# Patient Record
Sex: Female | Born: 1975 | Race: Black or African American | Hispanic: No | Marital: Single | Smoking: Former smoker
Health system: Southern US, Community
[De-identification: ages and names within clinical notes are randomized; demographics above are authoritative.]

## PROBLEM LIST (undated history)

## (undated) DIAGNOSIS — R7303 Prediabetes: Secondary | ICD-10-CM

## (undated) DIAGNOSIS — M1990 Unspecified osteoarthritis, unspecified site: Secondary | ICD-10-CM

## (undated) DIAGNOSIS — K219 Gastro-esophageal reflux disease without esophagitis: Secondary | ICD-10-CM

## (undated) DIAGNOSIS — F32A Depression, unspecified: Secondary | ICD-10-CM

## (undated) DIAGNOSIS — I10 Essential (primary) hypertension: Secondary | ICD-10-CM

## (undated) DIAGNOSIS — D649 Anemia, unspecified: Secondary | ICD-10-CM

## (undated) DIAGNOSIS — F419 Anxiety disorder, unspecified: Secondary | ICD-10-CM

## (undated) DIAGNOSIS — L309 Dermatitis, unspecified: Secondary | ICD-10-CM

## (undated) DIAGNOSIS — R928 Other abnormal and inconclusive findings on diagnostic imaging of breast: Secondary | ICD-10-CM

## (undated) DIAGNOSIS — G44209 Tension-type headache, unspecified, not intractable: Secondary | ICD-10-CM

## (undated) DIAGNOSIS — Z923 Personal history of irradiation: Secondary | ICD-10-CM

## (undated) DIAGNOSIS — R0981 Nasal congestion: Secondary | ICD-10-CM

## (undated) DIAGNOSIS — C50319 Malignant neoplasm of lower-inner quadrant of unspecified female breast: Secondary | ICD-10-CM

## (undated) DIAGNOSIS — Z87828 Personal history of other (healed) physical injury and trauma: Secondary | ICD-10-CM

## (undated) HISTORY — PX: TUBAL LIGATION: SHX77

## (undated) HISTORY — DX: Malignant neoplasm of lower-inner quadrant of unspecified female breast: C50.319

---

## 1998-02-24 DIAGNOSIS — Z87828 Personal history of other (healed) physical injury and trauma: Secondary | ICD-10-CM

## 1998-02-24 HISTORY — DX: Personal history of other (healed) physical injury and trauma: Z87.828

## 2005-03-31 ENCOUNTER — Emergency Department (HOSPITAL_COMMUNITY): Admission: EM | Admit: 2005-03-31 | Discharge: 2005-03-31 | Payer: Self-pay | Admitting: Emergency Medicine

## 2007-03-28 ENCOUNTER — Emergency Department (HOSPITAL_COMMUNITY): Admission: EM | Admit: 2007-03-28 | Discharge: 2007-03-28 | Payer: Self-pay | Admitting: Emergency Medicine

## 2007-07-30 ENCOUNTER — Emergency Department (HOSPITAL_COMMUNITY): Admission: EM | Admit: 2007-07-30 | Discharge: 2007-07-30 | Payer: Self-pay | Admitting: Emergency Medicine

## 2007-07-31 ENCOUNTER — Emergency Department (HOSPITAL_COMMUNITY): Admission: EM | Admit: 2007-07-31 | Discharge: 2007-07-31 | Payer: Self-pay | Admitting: Emergency Medicine

## 2007-12-08 ENCOUNTER — Encounter: Admission: RE | Admit: 2007-12-08 | Discharge: 2008-01-19 | Payer: Self-pay | Admitting: Specialist

## 2007-12-26 ENCOUNTER — Emergency Department (HOSPITAL_COMMUNITY): Admission: EM | Admit: 2007-12-26 | Discharge: 2007-12-26 | Payer: Self-pay | Admitting: Emergency Medicine

## 2009-02-28 ENCOUNTER — Emergency Department (HOSPITAL_COMMUNITY): Admission: EM | Admit: 2009-02-28 | Discharge: 2009-02-28 | Payer: Self-pay | Admitting: Family Medicine

## 2009-11-01 ENCOUNTER — Emergency Department (HOSPITAL_COMMUNITY): Admission: EM | Admit: 2009-11-01 | Discharge: 2009-11-01 | Payer: Self-pay | Admitting: Emergency Medicine

## 2010-11-21 LAB — URINALYSIS, ROUTINE W REFLEX MICROSCOPIC
Bilirubin Urine: NEGATIVE
Hgb urine dipstick: NEGATIVE
Ketones, ur: NEGATIVE
Specific Gravity, Urine: 1.037 — ABNORMAL HIGH

## 2010-11-26 LAB — POCT PREGNANCY, URINE: Preg Test, Ur: NEGATIVE

## 2011-01-08 ENCOUNTER — Encounter: Payer: Self-pay | Admitting: Emergency Medicine

## 2011-01-08 ENCOUNTER — Emergency Department (HOSPITAL_COMMUNITY)
Admission: EM | Admit: 2011-01-08 | Discharge: 2011-01-09 | Disposition: A | Discharge status: Home / Self Care | Payer: Medicaid Other | Attending: Emergency Medicine | Admitting: Emergency Medicine

## 2011-01-08 ENCOUNTER — Emergency Department (HOSPITAL_COMMUNITY): Payer: Medicaid Other

## 2011-01-08 DIAGNOSIS — Y9239 Other specified sports and athletic area as the place of occurrence of the external cause: Secondary | ICD-10-CM | POA: Insufficient documentation

## 2011-01-08 DIAGNOSIS — M25469 Effusion, unspecified knee: Secondary | ICD-10-CM | POA: Insufficient documentation

## 2011-01-08 DIAGNOSIS — I10 Essential (primary) hypertension: Secondary | ICD-10-CM | POA: Insufficient documentation

## 2011-01-08 DIAGNOSIS — W010XXA Fall on same level from slipping, tripping and stumbling without subsequent striking against object, initial encounter: Secondary | ICD-10-CM | POA: Insufficient documentation

## 2011-01-08 DIAGNOSIS — IMO0002 Reserved for concepts with insufficient information to code with codable children: Secondary | ICD-10-CM | POA: Insufficient documentation

## 2011-01-08 DIAGNOSIS — M25569 Pain in unspecified knee: Secondary | ICD-10-CM | POA: Insufficient documentation

## 2011-01-08 DIAGNOSIS — S8390XA Sprain of unspecified site of unspecified knee, initial encounter: Secondary | ICD-10-CM

## 2011-01-08 HISTORY — DX: Essential (primary) hypertension: I10

## 2011-01-08 NOTE — ED Notes (Signed)
PT. REPORTS FELL YESTERDAY ( TRIPPED) INJURED LEFT KNEE WITH PAIN AND SLIGHT SWELLING , PAIN WORSE WITH WEIGHT BEARING .

## 2011-01-09 MED ORDER — HYDROCODONE-ACETAMINOPHEN 5-325 MG PO TABS: 2.0000 | ORAL_TABLET | ORAL | Status: AC | PRN

## 2011-01-09 NOTE — ED Provider Notes (Signed)
Medical screening examination/treatment/procedure(s) were performed by non-physician practitioner and as supervising physician I was immediately available for consultation/collaboration.   Hanley Seamen, MD 01/09/11 [phone]

## 2011-01-09 NOTE — ED Provider Notes (Signed)
History     CSN: [id number] Arrival date & time: 01/08/2011  9:48 PM   First MD Initiated Contact with Patient 01/08/11 2356      Chief Complaint  Patient presents with  . Fall    (Consider location/radiation/quality/duration/timing/severity/associated sxs/prior treatment) HPI Comments: Patient states she was lifting weights at the gym when she twisted her left knee, falling onto it - since then reports left medial knee pain and the sensation that the knee will give out on her.  Patient is a 35 y.o. female presenting with fall. The history is provided by the patient. No language interpreter was used.  Fall The accident occurred yesterday. The fall occurred while recreating/playing. She landed on concrete. There was no blood loss. The point of impact was the left knee. The pain is present in the left knee. The pain is at a severity of 5/10. The pain is moderate. She was ambulatory at the scene. There was no entrapment after the fall. There was no drug use involved in the accident. There was no alcohol use involved in the accident. Pertinent negatives include no fever, no numbness, no bowel incontinence, no nausea, no vomiting, no headaches, no loss of consciousness and no tingling. The symptoms are aggravated by activity and flexion. She has tried nothing for the symptoms.    Past Medical History  Diagnosis Date  . Hypertension     Past Surgical History  Procedure Date  . Caesarean     No family history on file.  History  Substance Use Topics  . Smoking status: Never Smoker   . Smokeless tobacco: Not on file  . Alcohol Use: No    OB History    Grav Para Term Preterm Abortions TAB SAB Ect Mult Living                  Review of Systems  Constitutional: Negative for fever.  Gastrointestinal: Negative for nausea, vomiting and bowel incontinence.  Neurological: Negative for tingling, loss of consciousness, numbness and headaches.  All other systems reviewed and are  negative.    Allergies  Review of patient's allergies indicates no known allergies.  Home Medications  No current outpatient prescriptions on file.  BP 116/93  Pulse 72  Temp(Src) 98.8 F (37.1 C) (Oral)  SpO2 98%  Physical Exam  Constitutional: She is oriented to person, place, and time. She appears well-developed and well-nourished.  HENT:  Head: Normocephalic and atraumatic.  Right Ear: External ear normal.  Mouth/Throat: Oropharynx is clear and moist.  Eyes: Conjunctivae are normal. Pupils are equal, round, and reactive to light.  Neck: Normal range of motion. Neck supple.  Cardiovascular: Normal rate and regular rhythm.   Pulmonary/Chest: Effort normal and breath sounds normal.  Abdominal: Soft. There is no tenderness.  Musculoskeletal:       Left knee: She exhibits decreased range of motion and swelling. She exhibits no effusion, normal alignment, no LCL laxity, normal patellar mobility and normal meniscus. tenderness found. Medial joint line tenderness noted. No LCL and no patellar tendon tenderness noted.  Neurological: She is alert and oriented to person, place, and time. She has normal reflexes.  Skin: Skin is warm and dry.  Psychiatric: She has a normal mood and affect. Her behavior is normal. Judgment and thought content normal.    ED Course  Procedures (including critical care time)  Labs Reviewed - No data to display Dg Knee Complete 4 Views Left  01/08/2011  *RADIOLOGY REPORT*  Clinical Data: 35 year old female  status post twisting injury with pain.  LEFT KNEE - COMPLETE 4+ VIEW  Comparison: 12/26/2007.  Findings: No joint effusion is evident. Bone mineralization is within normal limits.  Patella intact.  Joint spaces preserved.  No acute fracture.  IMPRESSION: No acute osseous abnormality identified about the left knee.  Original Report Authenticated By: Harley Hallmark, M.D.     Left knee strain   MDM  Patient with likely left knee strain.  I doubt  either MCL or meniscal tear, though these could be possible, there is no effusion in the joint.  Will place the patient in knee immobilizer and crutches for 1 week, if she is still having pain and difficulty with walking at that point, I will have her follow up with Dr. August Saucer with orthopedics.        Izola Price [place], [place] 01/09/11 0028

## 2011-01-14 ENCOUNTER — Emergency Department (HOSPITAL_COMMUNITY)
Admission: EM | Admit: 2011-01-14 | Discharge: 2011-01-14 | Disposition: A | Discharge status: Home / Self Care | Payer: Medicaid Other | Attending: Emergency Medicine | Admitting: Emergency Medicine

## 2011-01-14 ENCOUNTER — Encounter (HOSPITAL_COMMUNITY): Payer: Self-pay

## 2011-01-14 DIAGNOSIS — I10 Essential (primary) hypertension: Secondary | ICD-10-CM | POA: Insufficient documentation

## 2011-01-14 DIAGNOSIS — T6391XA Toxic effect of contact with unspecified venomous animal, accidental (unintentional), initial encounter: Secondary | ICD-10-CM | POA: Insufficient documentation

## 2011-01-14 DIAGNOSIS — M79609 Pain in unspecified limb: Secondary | ICD-10-CM | POA: Insufficient documentation

## 2011-01-14 DIAGNOSIS — R229 Localized swelling, mass and lump, unspecified: Secondary | ICD-10-CM | POA: Insufficient documentation

## 2011-01-14 DIAGNOSIS — T63391A Toxic effect of venom of other spider, accidental (unintentional), initial encounter: Secondary | ICD-10-CM | POA: Insufficient documentation

## 2011-01-14 DIAGNOSIS — W57XXXA Bitten or stung by nonvenomous insect and other nonvenomous arthropods, initial encounter: Secondary | ICD-10-CM

## 2011-01-14 MED ORDER — CEPHALEXIN 500 MG PO CAPS: 500.0000 mg | ORAL_CAPSULE | Freq: Three times a day (TID) | ORAL | Status: AC

## 2011-01-14 NOTE — ED Notes (Signed)
Patient states she was bitten by a spider at home. Noted the spider was brown. Unknown size. States she had a bubble which she burst x 2. Noted scant yellow drainage.

## 2011-01-14 NOTE — ED Notes (Signed)
Pt presents with spider bite to R heel.  Pt reports she killed spider's body was approx 1 inch long and brown.  Pt reports noting a blister to area, she popped it with yellow drainage noted.  Ankle is warm to touch, swollen and painful.

## 2011-01-14 NOTE — ED Provider Notes (Signed)
Medical screening examination/treatment/procedure(s) were performed by non-physician practitioner and as supervising physician I was immediately available for consultation/collaboration.   Lilas Diefendorf, MD 01/14/11 2304 

## 2011-01-14 NOTE — ED Provider Notes (Signed)
History     CSN: [id number] Arrival date & time: 01/14/2011  6:16 PM   First MD Initiated Contact with Patient 01/14/11 1923      Chief Complaint  Patient presents with  . Insect Bite    (Consider location/radiation/quality/duration/timing/severity/associated sxs/prior treatment) HPI Comments: Patient with witnessed spider bite to right heel 2 days ago. Patient states yesterday she noted slight swelling in the area of the of the bite along with itching. She denies having any significant pain in the area. Yesterday she noted a blister forming which has since drained a mild amount of yellow fluid. She denies fever, nausea, vomiting. She denies any lymphadenopathy of her groin. She denies any redness streaking up her leg. She is able to walk on her foot without any problems.  Patient is a 35 y.o. female presenting with lower extremity pain. The history is provided by the patient.  Foot Pain This is a new problem. The current episode started in the past 7 days. The problem occurs constantly. The problem has been unchanged. Pertinent negatives include no abdominal pain, chills, fever, joint swelling, myalgias, nausea, rash or vomiting. She has tried nothing for the symptoms.    Past Medical History  Diagnosis Date  . Hypertension     Past Surgical History  Procedure Date  . Caesarean     History reviewed. No pertinent family history.  History  Substance Use Topics  . Smoking status: Never Smoker   . Smokeless tobacco: Not on file  . Alcohol Use: No    OB History    Grav Para Term Preterm Abortions TAB SAB Ect Mult Living                  Review of Systems  Constitutional: Negative for fever and chills.  Gastrointestinal: Negative for nausea, vomiting and abdominal pain.  Musculoskeletal: Negative for myalgias and joint swelling.  Skin: Positive for wound. Negative for rash.  Hematological: Negative for adenopathy.    Allergies  Review of patient's allergies  indicates no known allergies.  Home Medications   Current Outpatient Rx  Name Route Sig Dispense Refill  . HYDROCODONE-ACETAMINOPHEN 5-325 MG PO TABS Oral Take 2 tablets by mouth every 4 (four) hours as needed for pain. 20 tablet 0    BP 142/82  Pulse 90  Temp(Src) 98.8 F (37.1 C) (Oral)  Resp 18  SpO2 98%  LMP 12/17/2010  Physical Exam  Nursing note and vitals reviewed. Constitutional: She is oriented to person, place, and time. She appears well-developed and well-nourished.  HENT:  Head: Normocephalic and atraumatic.  Eyes: Right eye exhibits no discharge. Left eye exhibits no discharge.  Neck: Normal range of motion. Neck supple.  Musculoskeletal: Normal range of motion. She exhibits no edema and no tenderness.       Patient with small blister noted to the right heel without any significant swelling or surrounding erythema. Area is not significantly tender to palpation. There is a small amount of yellow fluid draining from the wound. There is no apparent lymphangitis.  Neurological: She is alert and oriented to person, place, and time.  Skin: Skin is warm and dry. No rash noted. There is erythema.  Psychiatric: She has a normal mood and affect.    ED Course  Procedures (including critical care time)  Labs Reviewed - No data to display No results found.   1. Insect bite    7:36 PM Patient seen and examined.  Pt urged to return with worsening pain, worsening  swelling, expanding area of redness or streaking up extremity, fever, or any other concerns. Urged to take complete course of antibiotics as prescribed. Pt verbalizes understanding and agrees with plan.   MDM  Area of drainage c/w localized area of cellulitis or allergic/inflammatory reaction. Will treat with abx given ? purulent drainage. Area is unimpressive. Return is only indicated if worsening.       Eustace Moore [place], [place] 01/14/11 2032

## 2011-01-14 NOTE — ED Notes (Signed)
Midlevel at bedside updating patient on plan

## 2011-03-08 ENCOUNTER — Emergency Department (INDEPENDENT_AMBULATORY_CARE_PROVIDER_SITE_OTHER)
Admission: EM | Admit: 2011-03-08 | Discharge: 2011-03-08 | Disposition: A | Discharge status: Home / Self Care | Payer: Medicaid Other | Source: Home / Self Care | Attending: Family Medicine | Admitting: Family Medicine

## 2011-03-08 ENCOUNTER — Encounter (HOSPITAL_COMMUNITY): Payer: Self-pay | Admitting: Emergency Medicine

## 2011-03-08 DIAGNOSIS — T148 Other injury of unspecified body region: Secondary | ICD-10-CM

## 2011-03-08 DIAGNOSIS — W57XXXA Bitten or stung by nonvenomous insect and other nonvenomous arthropods, initial encounter: Secondary | ICD-10-CM

## 2011-03-08 MED ORDER — METHYLPREDNISOLONE SODIUM SUCC 125 MG IJ SOLR
125.0000 mg | Freq: Once | INTRAMUSCULAR | Status: AC
  Administered 2011-03-08: 125 mg via INTRAMUSCULAR

## 2011-03-08 MED ORDER — METHYLPREDNISOLONE SODIUM SUCC 125 MG IJ SOLR
INTRAMUSCULAR | Status: AC
  Filled 2011-03-08: qty 2

## 2011-03-08 MED ORDER — MUPIROCIN 2 % EX OINT: TOPICAL_OINTMENT | Freq: Three times a day (TID) | CUTANEOUS | Status: AC

## 2011-03-08 MED ORDER — LORATADINE 10 MG PO TABS: 10.0000 mg | ORAL_TABLET | Freq: Every day | ORAL | Status: DC

## 2011-03-08 NOTE — ED Provider Notes (Signed)
History     CSN: [id number]  Arrival date & time 03/08/11  1040   First MD Initiated Contact with Patient 03/08/11 1054      Chief Complaint  Patient presents with  . Rash    (Consider location/radiation/quality/duration/timing/severity/associated sxs/prior treatment) Patient is a 36 y.o. female presenting with rash. The history is provided by the patient.  Rash  This is a new problem. The current episode started 12 to 24 hours ago. The problem has not changed since onset.The problem is associated with an insect bite/sting (states stayed at a hotel and now have bites). There has been no fever. The rash is present on the left arm, right arm, left upper leg and abdomen. The patient is experiencing no pain. Associated symptoms include itching. She has tried nothing for the symptoms.    Past Medical History  Diagnosis Date  . Hypertension     Past Surgical History  Procedure Date  . Caesarean   . Tubal ligation     No family history on file.  History  Substance Use Topics  . Smoking status: Never Smoker   . Smokeless tobacco: Not on file  . Alcohol Use: No    OB History    Grav Para Term Preterm Abortions TAB SAB Ect Mult Living                  Review of Systems  Skin: Positive for itching and rash.  All other systems reviewed and are negative.    Allergies  Review of patient's allergies indicates no known allergies.  Home Medications  No current outpatient prescriptions on file.  BP 119/88  Pulse 85  Temp(Src) 99.1 F (37.3 C) (Oral)  Resp 14  SpO2 100%  LMP 02/18/2011  Physical Exam  Constitutional: She appears well-developed.  HENT:  Head: Normocephalic.  Skin: Skin is warm and intact. Rash noted. Rash is urticarial.       Multiple bites all over her arms and legs there is swelling around the sites of the bites. She is rubbing her scalp but no lesions noted.  Psychiatric: Her mood appears anxious.    ED Course  Procedures (including  critical care time)  Labs Reviewed - No data to display No results found.   No diagnosis found.    MDM          Hassan Rowan, MD 03/09/11 [phone]

## 2011-03-08 NOTE — ED Notes (Signed)
Reports red bumps all over body.  Stayed in a motel last night and woke up with diverse pattern of bites

## 2011-07-06 ENCOUNTER — Emergency Department (HOSPITAL_COMMUNITY)
Admission: EM | Admit: 2011-07-06 | Discharge: 2011-07-06 | Disposition: A | Discharge status: Home / Self Care | Payer: Medicaid Other | Attending: Emergency Medicine | Admitting: Emergency Medicine

## 2011-07-06 ENCOUNTER — Encounter (HOSPITAL_COMMUNITY): Payer: Self-pay | Admitting: Emergency Medicine

## 2011-07-06 DIAGNOSIS — I10 Essential (primary) hypertension: Secondary | ICD-10-CM | POA: Insufficient documentation

## 2011-07-06 DIAGNOSIS — F410 Panic disorder [episodic paroxysmal anxiety] without agoraphobia: Secondary | ICD-10-CM | POA: Insufficient documentation

## 2011-07-06 DIAGNOSIS — F4329 Adjustment disorder with other symptoms: Secondary | ICD-10-CM | POA: Insufficient documentation

## 2011-07-06 DIAGNOSIS — F4320 Adjustment disorder, unspecified: Secondary | ICD-10-CM

## 2011-07-06 LAB — POCT I-STAT, CHEM 8
BUN: 10 mg/dL (ref 6–23)
Calcium, Ion: 1.2 mmol/L (ref 1.12–1.32)
Chloride: 103 mEq/L (ref 96–112)
Creatinine, Ser: 0.9 mg/dL (ref 0.50–1.10)
Glucose, Bld: 91 mg/dL (ref 70–99)
HCT: 34 % — ABNORMAL LOW (ref 36.0–46.0)
Hemoglobin: 11.6 g/dL — ABNORMAL LOW (ref 12.0–15.0)
Potassium: 3.7 mEq/L (ref 3.5–5.1)
Sodium: 140 mEq/L (ref 135–145)
TCO2: 25 mmol/L (ref 0–100)

## 2011-07-06 LAB — GLUCOSE, CAPILLARY: Glucose-Capillary: 97 mg/dL (ref 70–99)

## 2011-07-06 NOTE — ED Provider Notes (Signed)
History    36yF with near syncope. Mother currently very sick. Has not bee eating or sleeping well recently. Was visiting her today when became uspet and began to hyperventilate and was assissted to ground by family. Pt c/o feeling tired, otherwise no acute complaints. No CP or SOB. NO dizziness or lightheadedness currently. No palpitations.  CSN: [id number]  Arrival date & time 07/06/11  1356   First MD Initiated Contact with Patient 07/06/11 1401      Chief Complaint  Patient presents with  . Panic Attack    (Consider location/radiation/quality/duration/timing/severity/associated sxs/prior treatment) HPI  Past Medical History  Diagnosis Date  . Hypertension     Past Surgical History  Procedure Date  . Caesarean   . Tubal ligation     History reviewed. No pertinent family history.  History  Substance Use Topics  . Smoking status: Never Smoker   . Smokeless tobacco: Not on file  . Alcohol Use: No    OB History    Grav Para Term Preterm Abortions TAB SAB Ect Mult Living                  Review of Systems   Review of symptoms negative unless otherwise noted in HPI.   Allergies  Review of patient's allergies indicates no known allergies.  Home Medications   Current Outpatient Rx  Name Route Sig Dispense Refill  . LORATADINE 10 MG PO TABS Oral Take 1 tablet (10 mg total) by mouth daily. 30 tablet 0    BP 141/92  Pulse 102  Temp(Src) 98.1 F (36.7 C) (Oral)  Resp 16  SpO2 100%  LMP 07/04/2011  Physical Exam  Nursing note and vitals reviewed. Constitutional: She is oriented to person, place, and time. She appears well-developed and well-nourished. No distress.  HENT:  Head: Normocephalic and atraumatic.  Eyes: Conjunctivae are normal. Pupils are equal, round, and reactive to light. Right eye exhibits no discharge. Left eye exhibits no discharge.  Neck: Normal range of motion. Neck supple.  Cardiovascular: Normal rate, regular rhythm and normal heart  sounds.  Exam reveals no gallop and no friction rub.   No murmur heard. Pulmonary/Chest: Effort normal and breath sounds normal. No respiratory distress.  Abdominal: Soft. She exhibits no distension. There is no tenderness.  Musculoskeletal: She exhibits no edema and no tenderness.       Lower extremities symmetric as compared to each other. No calf tenderness. Negative Homan's. No palpable cords.   Neurological: She is alert and oriented to person, place, and time. No cranial nerve deficit. She exhibits normal muscle tone. Coordination normal.       gcs 15  Skin: Skin is warm and dry.  Psychiatric: Her behavior is normal. Thought content normal.       flat affect    ED Course  Procedures (including critical care time)  Labs Reviewed  POCT I-STAT, CHEM 8 - Abnormal; Notable for the following:    Hemoglobin 11.6 (*)    HCT 34.0 (*)    All other components within normal limits  GLUCOSE, CAPILLARY   No results found.  EKG:  Rhythm: nsr Rate: 88 Axis: normal Intervals: normal ST segments: NS ST changes. t wave flattening in III.   1. Emotional crisis       MDM  36yf with near syncopal event. Suspect related to emotional situation, poor sleep and poor recent PO intake. Very mild anemia, doubt etiology. EKG with normal intervals and no ischemic changes. Family at bedside  for support. Feel that can be safely DC'd at this time. Return precautions discussed.        Raeford Razor, MD 07/10/11 [phone]

## 2011-07-06 NOTE — Discharge Instructions (Signed)
Emotional Crisis Part of your problem today may be due to an emotional crisis. Emotional states can cause many different physical signs and symptoms. These may include:  Chest or stomach pain.   Fluttering heartbeat.   Passing out.   Breathing difficulty.   Headaches.   Trembling.   Hot or cold flashes.   Numbness.   Dizziness.   Unusual muscle pain or fatigue.   Insomnia.  When you have other medical problems, they are often made worse by emotional upsets. Emotional crises can increase your stress and anxiety. Finding ways to reduce your stress level can make you feel better. You will become more capable of dealing with these emotional states. Regular physical exercise such as walking can be very beneficial. Counseling or medicine to treat anxiety or depression may also be needed. See your caregiver if you have further problems or questions about your condition. Document Released: 02/10/2005 Document Revised: 01/30/2011 Document Reviewed: 07/28/2006 [facility] Patient Information 2012 [place], [place]. RESOURCE GUIDE  Dental Problems  Patients with Medicaid: [facility] [phone] [address]                                           [phone] [address] Phone:  [phone]                                                  Phone:  [phone]  If unable to pay or uninsured, contact:  [facility] or [facility]. to become qualified for the adult dental clinic.  Chronic Pain Problems Contact [facility]  [phone] Patients need to be referred by their primary care doctor.  Insufficient Money for Medicine Contact [employer]:  call "211" or [facility] [phone].  No Primary Care Doctor Call [facility]  [phone] Other agencies that provide inexpensive medical care    [facility]  [phone]    [facility]  [phone]    [facility]  [phone]  [facility]  [phone]    [facility]  [phone]    [facility]  [phone]  Psychological Services [facility]  [phone] [facility]  [phone] [facility]   [phone] (emergency services [phone])  Substance Abuse Resources [facility]  [phone] [facility] [phone] [facility] [place] [phone] Floydene Flock [phone] Residential & Outpatient Substance Abuse Program  [phone]  Abuse/Neglect [facility] Child Abuse Hotline 484-691-5758 [facility] Child Abuse Hotline 618-090-6703 (After Hours)  Emergency Shelter [facility] [phone]  Maternity Homes [facility] [place] [facility] [phone] [facility] [phone]  MRSA Hotline #:   9545288785    [facility] Resources  [facility]     [employer]                          [facility] Dept. [address] [place] Phone:  [phone]                                   Phone:  [phone]                 Phone:  [place] Phone:  [place] [phone] (After Hours)

## 2011-07-06 NOTE — ED Notes (Addendum)
CBG measured 97mg . Renita RN advised. ENM

## 2011-07-06 NOTE — ED Notes (Addendum)
Pt here with mother is who is in ICU on life support, pt was in elevator and began hyperventilating and dry heaving.  Pt became weak and was lowered to floor by family member in the waiting room.  Pt states she hasn't eaten or slept in 2 days.  Pt calmer at this time, but still tearful.

## 2011-07-06 NOTE — ED Notes (Signed)
Bed:WA06<BR> Expected date:<BR> Expected time:<BR> Means of arrival:<BR> Comments:<BR> hold

## 2012-01-26 ENCOUNTER — Emergency Department (HOSPITAL_COMMUNITY)
Admission: EM | Admit: 2012-01-26 | Discharge: 2012-01-27 | Disposition: A | Discharge status: Home / Self Care | Payer: Medicaid Other | Attending: Emergency Medicine | Admitting: Emergency Medicine

## 2012-01-26 ENCOUNTER — Encounter (HOSPITAL_COMMUNITY): Payer: Self-pay | Admitting: *Deleted

## 2012-01-26 DIAGNOSIS — IMO0001 Reserved for inherently not codable concepts without codable children: Secondary | ICD-10-CM | POA: Insufficient documentation

## 2012-01-26 DIAGNOSIS — W57XXXA Bitten or stung by nonvenomous insect and other nonvenomous arthropods, initial encounter: Secondary | ICD-10-CM

## 2012-01-26 DIAGNOSIS — L299 Pruritus, unspecified: Secondary | ICD-10-CM

## 2012-01-26 DIAGNOSIS — I10 Essential (primary) hypertension: Secondary | ICD-10-CM | POA: Insufficient documentation

## 2012-01-26 DIAGNOSIS — Y92009 Unspecified place in unspecified non-institutional (private) residence as the place of occurrence of the external cause: Secondary | ICD-10-CM | POA: Insufficient documentation

## 2012-01-26 DIAGNOSIS — Y9389 Activity, other specified: Secondary | ICD-10-CM | POA: Insufficient documentation

## 2012-01-26 NOTE — ED Notes (Signed)
Patient her with ? Bug bites since Saturday.  States that she had the same rash about a year ago and went to urgent care and was told that they were bug bites.

## 2012-01-26 NOTE — ED Provider Notes (Signed)
History   This chart was scribed for Jones Skene, MD by Gerlean Ren, ED Scribe. This patient was seen in room TR06C/TR06C and the patient's care was started at 11:55 PM    CSN: [id number]  Arrival date & time 01/26/12  2335     Chief Complaint  Patient presents with  . Insect Bite     The history is provided by the patient. No language interpreter was used.   Janice Crawford is a 36 y.o. female with h/o HTN who presents to the Emergency Department complaining of constant itching rash over bilateral upper and lower extremities that pt suspects are from bed bugs after sleeping at a friend's house Saturday night. Itching discomfort is 8/10, she's not taken anything for it, is somewhat relieved by itching, no other alleviating or exacerbating factors no other associated symptoms.  Pt denies cough, visual disturbances, and back pain.  Pt denies tobacco and alcohol use.   Past Medical History  Diagnosis Date  . Hypertension     Past Surgical History  Procedure Date  . Caesarean   . Tubal ligation     History reviewed. No pertinent family history.  History  Substance Use Topics  . Smoking status: Never Smoker   . Smokeless tobacco: Not on file  . Alcohol Use: No    No OB history provided.   Review of Systems At least 10pt or greater review of systems completed and are negative except where specified in the HPI.  Allergies  Other  Home Medications  No current outpatient prescriptions on file.  BP 127/85  Pulse 82  Temp 98.3 F (36.8 C) (Oral)  Resp 18  SpO2 96%  Physical Exam  Nursing notes reviewed.  Electronic medical record reviewed. VITAL SIGNS:   Filed Vitals:   01/26/12 2342  BP: 127/85  Pulse: 82  Temp: 98.3 F (36.8 C)  TempSrc: Oral  Resp: 18  SpO2: 96%   CONSTITUTIONAL: Awake, oriented, appears non-toxic HENT: Atraumatic, normocephalic, oral mucosa pink and moist, airway patent. Nares patent without drainage. External ears normal. EYES:  Conjunctiva clear, EOMI, PERRLA NECK: Trachea midline, non-tender, supple CARDIOVASCULAR: Normal heart rate, Normal rhythm, No murmurs, rubs, gallops PULMONARY/CHEST: Clear to auscultation, no rhonchi, wheezes, or rales. Symmetrical breath sounds. Non-tender. ABDOMINAL: Non-distended, soft, non-tender - no rebound or guarding.  BS normal. NEUROLOGIC: Non-focal, moving all four extremities, no gross sensory or motor deficits. EXTREMITIES: No clubbing, cyanosis, or edema SKIN: Warm, Dry; scattered approximately half centimeter to centimeter mildly erythematous raised pruritic lesions scattered throughout the patient's body located on bilateral lower extremities, toes of both feet, mostly on the left forearm and left upper arm as well as right triceps  ED Course  Procedures (including critical care time) DIAGNOSTIC STUDIES: Oxygen Saturation is 96% on room air, adequate by my interpretation.    COORDINATION OF CARE: 12:00 AM- Patient informed of clinical course, understands medical decision-making process, and agrees with plan.  Labs Reviewed - No data to display No results found.   1. Arthropod bite   2. Itchy skin       MDM  Janice Crawford is a 36 y.o. female presenting with arthropod bites. I described to her how to take care of her clothing that she wore while at her friend's house in case they are bed bugs that bit her, and what to look for look for in case she has them now.  We'll treat the patient symptomatically with Benadryl and with topical EMLA cream for  her extremely itchy lesions to prevent excoriation and secondary infection. Non-life-threatening rash, no bullae, no involvement of mucous membranes.    I personally performed the services described in this documentation, which was scribed in my presence. The recorded information has been reviewed and is accurate. Jones Skene, M.D.           Jones Skene, MD 01/27/12 1610

## 2012-01-26 NOTE — ED Notes (Signed)
Pt. States she slept over at someone's house and thinks she has bed bugs. States itching all over, reports started on neck and has moved down her body. Pt. Has multiple red raised bumps on arms and legs.

## 2012-01-27 MED ORDER — DIPHENHYDRAMINE HCL 25 MG PO CAPS: 25.0000 mg | ORAL_CAPSULE | Freq: Four times a day (QID) | ORAL | Status: DC | PRN

## 2012-01-27 MED ORDER — LIDOCAINE-PRILOCAINE 2.5-2.5 % EX CREA
TOPICAL_CREAM | Freq: Once | CUTANEOUS | Status: AC
  Administered 2012-01-27: 1 via TOPICAL
  Filled 2012-01-27: qty 5

## 2013-07-05 ENCOUNTER — Encounter (HOSPITAL_COMMUNITY): Payer: Self-pay | Admitting: Emergency Medicine

## 2013-07-05 ENCOUNTER — Emergency Department (HOSPITAL_COMMUNITY)
Admission: EM | Admit: 2013-07-05 | Discharge: 2013-07-05 | Disposition: A | Discharge status: Home / Self Care | Payer: Medicaid Other | Attending: Emergency Medicine | Admitting: Emergency Medicine

## 2013-07-05 ENCOUNTER — Emergency Department (HOSPITAL_COMMUNITY): Payer: Medicaid Other

## 2013-07-05 DIAGNOSIS — I10 Essential (primary) hypertension: Secondary | ICD-10-CM | POA: Insufficient documentation

## 2013-07-05 DIAGNOSIS — R51 Headache: Secondary | ICD-10-CM

## 2013-07-05 DIAGNOSIS — J329 Chronic sinusitis, unspecified: Secondary | ICD-10-CM

## 2013-07-05 DIAGNOSIS — Z87828 Personal history of other (healed) physical injury and trauma: Secondary | ICD-10-CM | POA: Insufficient documentation

## 2013-07-05 DIAGNOSIS — Z5900 Homelessness unspecified: Secondary | ICD-10-CM | POA: Insufficient documentation

## 2013-07-05 DIAGNOSIS — Z8742 Personal history of other diseases of the female genital tract: Secondary | ICD-10-CM | POA: Insufficient documentation

## 2013-07-05 DIAGNOSIS — R519 Headache, unspecified: Secondary | ICD-10-CM

## 2013-07-05 MED ORDER — DIPHENHYDRAMINE HCL 50 MG/ML IJ SOLN
25.0000 mg | Freq: Once | INTRAMUSCULAR | Status: AC
  Administered 2013-07-05: 16:00:00 via INTRAVENOUS
  Filled 2013-07-05: qty 1

## 2013-07-05 MED ORDER — KETOROLAC TROMETHAMINE 30 MG/ML IJ SOLN
30.0000 mg | Freq: Once | INTRAMUSCULAR | Status: AC
  Administered 2013-07-05: 30 mg via INTRAVENOUS
  Filled 2013-07-05: qty 1

## 2013-07-05 MED ORDER — AMOXICILLIN 500 MG PO CAPS: 500.0000 mg | ORAL_CAPSULE | Freq: Three times a day (TID) | ORAL | Status: DC

## 2013-07-05 MED ORDER — IBUPROFEN 400 MG PO TABS: 400.0000 mg | ORAL_TABLET | Freq: Four times a day (QID) | ORAL | Status: DC | PRN

## 2013-07-05 MED ORDER — ONDANSETRON HCL 4 MG/2ML IJ SOLN
4.0000 mg | Freq: Once | INTRAMUSCULAR | Status: AC
  Administered 2013-07-05: 4 mg via INTRAVENOUS
  Filled 2013-07-05: qty 2

## 2013-07-05 MED ORDER — SODIUM CHLORIDE 0.9 % IV BOLUS (SEPSIS)
1000.0000 mL | Freq: Once | INTRAVENOUS | Status: AC
  Administered 2013-07-05: 1000 mL via INTRAVENOUS

## 2013-07-05 NOTE — ED Notes (Signed)
Pt reports having severe headache x 2 weeks, reports its sharp pains to fore head and radiates to right side of head, neck and into right shoulder. Denies any n/v. But pt is concerned due to head trauma in 2003. No acute distress noted at triage.

## 2013-07-05 NOTE — Discharge Instructions (Signed)
Sinusitis Sinusitis is redness, soreness, and swelling (inflammation) of the paranasal sinuses. Paranasal sinuses are air pockets within the bones of your face (beneath the eyes, the middle of the forehead, or above the eyes). In healthy paranasal sinuses, mucus is able to drain out, and air is able to circulate through them by way of your nose. However, when your paranasal sinuses are inflamed, mucus and air can become trapped. This can allow bacteria and other germs to grow and cause infection. Sinusitis can develop quickly and last only a short time (acute) or continue over a long period (chronic). Sinusitis that lasts for more than 12 weeks is considered chronic.  CAUSES  Causes of sinusitis include:  Allergies.  Structural abnormalities, such as displacement of the cartilage that separates your nostrils (deviated septum), which can decrease the air flow through your nose and sinuses and affect sinus drainage.  Functional abnormalities, such as when the small hairs (cilia) that line your sinuses and help remove mucus do not work properly or are not present. SYMPTOMS  Symptoms of acute and chronic sinusitis are the same. The primary symptoms are pain and pressure around the affected sinuses. Other symptoms include:  Upper toothache.  Earache.  Headache.  Bad breath.  Decreased sense of smell and taste.  A cough, which worsens when you are lying flat.  Fatigue.  Fever.  Thick drainage from your nose, which often is green and may contain pus (purulent).  Swelling and warmth over the affected sinuses. DIAGNOSIS  Your caregiver will perform a physical exam. During the exam, your caregiver may:  Look in your nose for signs of abnormal growths in your nostrils (nasal polyps).  Tap over the affected sinus to check for signs of infection.  View the inside of your sinuses (endoscopy) with a special imaging device with a light attached (endoscope), which is inserted into your  sinuses. If your caregiver suspects that you have chronic sinusitis, one or more of the following tests may be recommended:  Allergy tests.  Nasal culture A sample of mucus is taken from your nose and sent to a lab and screened for bacteria.  Nasal cytology A sample of mucus is taken from your nose and examined by your caregiver to determine if your sinusitis is related to an allergy. TREATMENT  Most cases of acute sinusitis are related to a viral infection and will resolve on their own within 10 days. Sometimes medicines are prescribed to help relieve symptoms (pain medicine, decongestants, nasal steroid sprays, or saline sprays).  However, for sinusitis related to a bacterial infection, your caregiver will prescribe antibiotic medicines. These are medicines that will help kill the bacteria causing the infection.  Rarely, sinusitis is caused by a fungal infection. In theses cases, your caregiver will prescribe antifungal medicine. For some cases of chronic sinusitis, surgery is needed. Generally, these are cases in which sinusitis recurs more than 3 times per year, despite other treatments. HOME CARE INSTRUCTIONS   Drink plenty of water. Water helps thin the mucus so your sinuses can drain more easily.  Use a humidifier.  Inhale steam 3 to 4 times a day (for example, sit in the bathroom with the shower running).  Apply a warm, moist washcloth to your face 3 to 4 times a day, or as directed by your caregiver.  Use saline nasal sprays to help moisten and clean your sinuses.  Take over-the-counter or prescription medicines for pain, discomfort, or fever only as directed by your caregiver. [place]  CARE IF:  You have increasing pain or severe headaches.  You have nausea, vomiting, or drowsiness.  You have swelling around your face.  You have vision problems.  You have a stiff neck.  You have difficulty breathing. MAKE SURE YOU:   Understand these  instructions.  Will watch your condition.  Will get help right away if you are not doing well or get worse. Document Released: 02/10/2005 Document Revised: 05/05/2011 Document Reviewed: 02/25/2011 [facility] Patient Information 2014 [place], [place].  Sinus Headache A sinus headache is when your sinuses become clogged or swollen. Sinus headaches can range from mild to severe.  CAUSES A sinus headache can have different causes, such as:  Colds.  Sinus infections.  Allergies. SYMPTOMS  Symptoms of a sinus headache may vary and can include:  Headache.  Pain or pressure in the face.  Congested or runny nose.  Fever.  Inability to smell.  Pain in upper teeth. Weather changes can make symptoms worse. TREATMENT  The treatment of a sinus headache depends on the cause.  Sinus pain caused by a sinus infection may be treated with antibiotic medicine.  Sinus pain caused by allergies may be helped by allergy medicines (antihistamines) and medicated nasal sprays.  Sinus pain caused by congestion may be helped by flushing the nose and sinuses with saline solution. HOME CARE INSTRUCTIONS   If antibiotics are prescribed, take them as directed. Finish them even if you start to feel better.  Only take over-the-counter or prescription medicines for pain, discomfort, or fever as directed by your caregiver.  If you have congestion, use a nasal spray to help reduce pressure. SEEK IMMEDIATE MEDICAL CARE IF:  You have a fever.  You have headaches more than once a week.  You have sensitivity to light or sound.  You have repeated nausea and vomiting.  You have vision problems.  You have sudden, severe pain in your face or head.  You have a seizure.  You are confused.  Your sinus headaches do not get better after treatment. Many people think they have a sinus headache when they actually have migraines or tension headaches. MAKE SURE YOU:   Understand these instructions.  Will  watch your condition.  Will get help right away if you are not doing well or get worse. Document Released: 03/20/2004 Document Revised: 05/05/2011 Document Reviewed: 05/11/2010 [facility] Patient Information 2014 [place].

## 2013-07-05 NOTE — ED Notes (Signed)
Patient transported to CT 
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
## 2013-07-05 NOTE — ED Notes (Signed)
Vital signs stable. 

## 2013-07-05 NOTE — ED Provider Notes (Signed)
Medical screening examination/treatment/procedure(s) were performed by non-physician practitioner and as supervising physician I was immediately available for consultation/collaboration.   EKG Interpretation None       Jasper Riling. Alvino Chapel, MD 07/05/13 2325

## 2013-10-05 ENCOUNTER — Encounter (HOSPITAL_COMMUNITY): Payer: Self-pay | Admitting: Emergency Medicine

## 2013-10-05 ENCOUNTER — Emergency Department (INDEPENDENT_AMBULATORY_CARE_PROVIDER_SITE_OTHER)
Admission: EM | Admit: 2013-10-05 | Discharge: 2013-10-05 | Disposition: A | Discharge status: Home / Self Care | Payer: Medicaid Other | Source: Home / Self Care

## 2013-10-05 ENCOUNTER — Other Ambulatory Visit (HOSPITAL_COMMUNITY)
Admission: RE | Admit: 2013-10-05 | Discharge: 2013-10-05 | Disposition: A | Discharge status: Home / Self Care | Payer: Medicaid Other | Source: Ambulatory Visit | Attending: Family Medicine | Admitting: Family Medicine

## 2013-10-05 DIAGNOSIS — R102 Pelvic and perineal pain: Secondary | ICD-10-CM

## 2013-10-05 DIAGNOSIS — N730 Acute parametritis and pelvic cellulitis: Secondary | ICD-10-CM

## 2013-10-05 DIAGNOSIS — Z113 Encounter for screening for infections with a predominantly sexual mode of transmission: Secondary | ICD-10-CM | POA: Diagnosis not present

## 2013-10-05 DIAGNOSIS — N898 Other specified noninflammatory disorders of vagina: Secondary | ICD-10-CM

## 2013-10-05 DIAGNOSIS — N760 Acute vaginitis: Secondary | ICD-10-CM | POA: Diagnosis present

## 2013-10-05 DIAGNOSIS — N949 Unspecified condition associated with female genital organs and menstrual cycle: Secondary | ICD-10-CM

## 2013-10-05 LAB — POCT URINALYSIS DIP (DEVICE)
BILIRUBIN URINE: NEGATIVE
Glucose, UA: NEGATIVE mg/dL
HGB URINE DIPSTICK: NEGATIVE
KETONES UR: NEGATIVE mg/dL
Nitrite: NEGATIVE
PH: 5.5 (ref 5.0–8.0)
Protein, ur: NEGATIVE mg/dL
Specific Gravity, Urine: 1.025 (ref 1.005–1.030)
Urobilinogen, UA: 0.2 mg/dL (ref 0.0–1.0)

## 2013-10-05 LAB — POCT PREGNANCY, URINE: Preg Test, Ur: NEGATIVE

## 2013-10-05 MED ORDER — AZITHROMYCIN 250 MG PO TABS
1000.0000 mg | ORAL_TABLET | Freq: Every day | ORAL | Status: DC
  Administered 2013-10-05: 1000 mg via ORAL

## 2013-10-05 MED ORDER — METRONIDAZOLE 500 MG PO TABS: 500.0000 mg | ORAL_TABLET | Freq: Two times a day (BID) | ORAL | Status: DC

## 2013-10-05 MED ORDER — CEFTRIAXONE SODIUM 250 MG IJ SOLR
250.0000 mg | Freq: Once | INTRAMUSCULAR | Status: AC
  Administered 2013-10-05: 250 mg via INTRAMUSCULAR

## 2013-10-05 MED ORDER — CEFTRIAXONE SODIUM 250 MG IJ SOLR
INTRAMUSCULAR | Status: AC
  Filled 2013-10-05: qty 250

## 2013-10-05 MED ORDER — AZITHROMYCIN 250 MG PO TABS
ORAL_TABLET | ORAL | Status: AC
  Filled 2013-10-05: qty 4

## 2013-10-05 NOTE — ED Provider Notes (Signed)
Medical screening examination/treatment/procedure(s) were performed by a resident physician or non-physician practitioner and as the supervising physician I was immediately available for consultation/collaboration.  Lynne Leader, MD    Gregor Hams, MD 10/05/13 [phone]

## 2013-10-05 NOTE — Discharge Instructions (Signed)
Pelvic Inflammatory Disease Pelvic inflammatory disease (PID) refers to an infection in some or all of the female organs. The infection can be in the uterus, ovaries, fallopian tubes, or the surrounding tissues in the pelvis. PID can cause abdominal or pelvic pain that comes on suddenly (acute pelvic pain). PID is a serious infection because it can lead to lasting (chronic) pelvic pain or the inability to have children (infertile).  CAUSES  The infection is often caused by the normal bacteria found in the vaginal tissues. PID may also be caused by an infection that is spread during sexual contact. PID can also occur following:   The birth of a baby.   A miscarriage.   An abortion.   Major pelvic surgery.   The use of an intrauterine device (IUD).   A sexual assault.  RISK FACTORS Certain factors can put a person at higher risk for PID, such as:  Being younger than 25 years.  Being sexually active at [place] age.  Usingnonbarrier contraception.  Havingmultiple sexual partners.  Having sex with someone who has symptoms of a genital infection.  Using oral contraception. Other times, certain behaviors can increase the possibility of getting PID, such as:  Having sex during your period.  Using a vaginal douche.  Having an intrauterine device (IUD) in place. SYMPTOMS   Abdominal or pelvic pain.   Fever.   Chills.   Abnormal vaginal discharge.  Abnormal uterine bleeding.   Unusual pain shortly after finishing your period. DIAGNOSIS  Your caregiver will choose some of the following methods to make a diagnosis, such as:   Performinga physical exam and history. A pelvic exam typically reveals a very tender uterus and surrounding pelvis.   Ordering laboratory tests including a pregnancy test, blood tests, and urine test.  Orderingcultures of the vagina and cervix to check for a sexually transmitted infection (STI).  Performing an ultrasound.    Performing a laparoscopic procedure to look inside the pelvis.  TREATMENT   Antibiotic medicines may be prescribed and taken by mouth.   Sexual partners may be treated when the infection is caused by a sexually transmitted disease (STD).   Hospitalization may be needed to give antibiotics intravenously.  Surgery may be needed, but this is rare. It may take weeks until you are completely well. If you are diagnosed with PID, you should also be checked for human immunodeficiency virus (HIV). HOME CARE INSTRUCTIONS   If given, take your antibiotics as directed. Finish the medicine even if you start to feel better.   Only take over-the-counter or prescription medicines for pain, discomfort, or fever as directed by your caregiver.   Do not have sexual intercourse until treatment is completed or as directed by your caregiver. If PID is confirmed, your recent sexual partner(s) will need treatment.   Keep your follow-up appointments. SEEK MEDICAL CARE IF:   You have increased or abnormal vaginal discharge.   You need prescription medicine for your pain.   You vomit.   You cannot take your medicines.   Your partner has an STD.  SEEK IMMEDIATE MEDICAL CARE IF:   You have a fever.   You have increased abdominal or pelvic pain.   You have chills.   You have pain when you urinate.   You are not better after 72 hours following treatment.  MAKE SURE YOU:   Understand these instructions.  Will watch your condition.  Will get help right away if you are not doing well or get worse.  Document Released: 02/10/2005 Document Revised: 06/07/2012 Document Reviewed: 02/06/2011 [facility] Patient Information 2015 [place], [place]. This information is not intended to replace advice given to you by your health care provider. Make sure you discuss any questions you have with your health care provider.  Pelvic Pain Female pelvic pain can be caused by many different things and  start from a variety of places. Pelvic pain refers to pain that is located in the lower half of the abdomen and between your hips. The pain may occur over a short period of time (acute) or may be reoccurring (chronic). The cause of pelvic pain may be related to disorders affecting the female reproductive organs (gynecologic), but it may also be related to the bladder, kidney stones, an intestinal complication, or muscle or skeletal problems. Getting help right away for pelvic pain is important, especially if there has been severe, sharp, or a sudden onset of unusual pain. It is also important to get help right away because some types of pelvic pain can be life threatening.  CAUSES  Below are only some of the causes of pelvic pain. The causes of pelvic pain can be in one of several categories.   Gynecologic.  Pelvic inflammatory disease.  Sexually transmitted infection.  Ovarian cyst or a twisted ovarian ligament (ovarian torsion).  Uterine lining that grows outside the uterus (endometriosis).  Fibroids, cysts, or tumors.  Ovulation.  Pregnancy.  Pregnancy that occurs outside the uterus (ectopic pregnancy).  Miscarriage.  Labor.  Abruption of the placenta or ruptured uterus.  Infection.  Uterine infection (endometritis).  Bladder infection.  Diverticulitis.  Miscarriage related to a uterine infection (septic abortion).  Bladder.  Inflammation of the bladder (cystitis).  Kidney stone(s).  Gastrointestinal.  Constipation.  Diverticulitis.  Neurologic.  Trauma.  Feeling pelvic pain because of mental or emotional causes (psychosomatic).  Cancers of the bowel or pelvis. EVALUATION  Your caregiver will want to take a careful history of your concerns. This includes recent changes in your health, a careful gynecologic history of your periods (menses), and a sexual history. Obtaining your family history and medical history is also important. Your caregiver may suggest  a pelvic exam. A pelvic exam will help identify the location and severity of the pain. It also helps in the evaluation of which organ system may be involved. In order to identify the cause of the pelvic pain and be properly treated, your caregiver may order tests. These tests may include:   A pregnancy test.  Pelvic ultrasonography.  An X-ray exam of the abdomen.  A urinalysis or evaluation of vaginal discharge.  Blood tests. HOME CARE INSTRUCTIONS   Only take over-the-counter or prescription medicines for pain, discomfort, or fever as directed by your caregiver.   Rest as directed by your caregiver.   Eat a balanced diet.   Drink enough fluids to make your urine clear or pale yellow, or as directed.   Avoid sexual intercourse if it causes pain.   Apply warm or cold compresses to the lower abdomen depending on which one helps the pain.   Avoid stressful situations.   Keep a journal of your pelvic pain. Write down when it started, where the pain is located, and if there are things that seem to be associated with the pain, such as food or your menstrual cycle.  Follow up with your caregiver as directed.  SEEK MEDICAL CARE IF:  Your medicine does not help your pain.  You have abnormal vaginal discharge. [place]  IF:   You have heavy bleeding from the vagina.   Your pelvic pain increases.   You feel light-headed or faint.   You have chills.   You have pain with urination or blood in your urine.   You have uncontrolled diarrhea or vomiting.   You have a fever or persistent symptoms for more than 3 days.  You have a fever and your symptoms suddenly get worse.   You are being physically or sexually abused.  MAKE SURE YOU:  Understand these instructions.  Will watch your condition.  Will get help if you are not doing well or get worse. Document Released: 01/08/2004 Document Revised: 06/27/2013 Document Reviewed:  06/02/2011 [facility] Patient Information 2015 [place], [place]. This information is not intended to replace advice given to you by your health care provider. Make sure you discuss any questions you have with your health care provider.  Sexually Transmitted Disease A sexually transmitted disease (STD) is a disease or infection that may be passed (transmitted) from person to person, usually during sexual activity. This may happen by way of saliva, semen, blood, vaginal mucus, or urine. Common STDs include:   Gonorrhea.   Chlamydia.   Syphilis.   HIV and AIDS.   Genital herpes.   Hepatitis B and C.   Trichomonas.   Human papillomavirus (HPV).   Pubic lice.   Scabies.  Mites.  Bacterial vaginosis. WHAT ARE CAUSES OF STDs? An STD may be caused by bacteria, a virus, or parasites. STDs are often transmitted during sexual activity if one person is infected. However, they may also be transmitted through nonsexual means. STDs may be transmitted after:   Sexual intercourse with an infected person.   Sharing sex toys with an infected person.   Sharing needles with an infected person or using unclean piercing or tattoo needles.  Having intimate contact with the genitals, mouth, or rectal areas of an infected person.   Exposure to infected fluids during birth. WHAT ARE THE SIGNS AND SYMPTOMS OF STDs? Different STDs have different symptoms. Some people may not have any symptoms. If symptoms are present, they may include:   Painful or bloody urination.   Pain in the pelvis, abdomen, vagina, anus, throat, or eyes.   A skin rash, itching, or irritation.  Growths, ulcerations, blisters, or sores in the genital and anal areas.  Abnormal vaginal discharge with or without bad odor.   Penile discharge in men.   Fever.   Pain or bleeding during sexual intercourse.   Swollen glands in the groin area.   Yellow skin and eyes (jaundice). This is seen with hepatitis.    Swollen testicles.  Infertility.  Sores and blisters in the mouth. HOW ARE STDs DIAGNOSED? To make a diagnosis, your health care provider may:   Take a medical history.   Perform a physical exam.   Take a sample of any discharge to examine.  Swab the throat, cervix, opening to the penis, rectum, or vagina for testing.  Test a sample of your first morning urine.   Perform blood tests.   Perform a Pap test, if this applies.   Perform a colposcopy.   Perform a laparoscopy.  HOW ARE STDs TREATED? Treatment depends on the STD. Some STDs may be treated but not cured.   Chlamydia, gonorrhea, trichomonas, and syphilis can be cured with antibiotic medicine.   Genital herpes, hepatitis, and HIV can be treated, but not cured, with prescribed medicines. The medicines lessen symptoms.   Genital warts from  HPV can be treated with medicine or by freezing, burning (electrocautery), or surgery. Warts may come back.   HPV cannot be cured with medicine or surgery. However, abnormal areas may be removed from the cervix, vagina, or vulva.   If your diagnosis is confirmed, your recent sexual partners need treatment. This is true even if they are symptom-free or have a negative culture or evaluation. They should not have sex until their health care providers say it is okay. HOW CAN I REDUCE MY RISK OF GETTING AN STD? Take these steps to reduce your risk of getting an STD:  Use latex condoms, dental dams, and water-soluble lubricants during sexual activity. Do not use petroleum jelly or oils.  Avoid having multiple sex partners.  Do not have sex with someone who has other sex partners.  Do not have sex with anyone you do not know or who is at high risk for an STD.  Avoid risky sex practices that can break your skin.  Do not have sex if you have open sores on your mouth or skin.  Avoid drinking too much alcohol or taking illegal drugs. Alcohol and drugs can affect your  judgment and put you in a vulnerable position.  Avoid engaging in oral and anal sex acts.  Get vaccinated for HPV and hepatitis. If you have not received these vaccines in the past, talk to your health care provider about whether one or both might be right for you.   If you are at risk of being infected with HIV, it is recommended that you take a prescription medicine daily to prevent HIV infection. This is called pre-exposure prophylaxis (PrEP). You are considered at risk if:  You are a man who has sex with other men (MSM).  You are a heterosexual man or woman and are sexually active with more than one partner.  You take drugs by injection.  You are sexually active with a partner who has HIV.  Talk with your health care provider about whether you are at high risk of being infected with HIV. If you choose to begin PrEP, you should first be tested for HIV. You should then be tested every 3 months for as long as you are taking PrEP.  WHAT SHOULD I DO IF I THINK I HAVE AN STD?  See your health care provider.   Tell your sexual partner(s). They should be tested and treated for any STDs.  Do not have sex until your health care provider says it is okay. WHEN SHOULD I GET IMMEDIATE MEDICAL CARE? Contact your health care provider right away if:   You have severe abdominal pain.  You are a man and notice swelling or pain in your testicles.  You are a woman and notice swelling or pain in your vagina. Document Released: 05/03/2002 Document Revised: 02/15/2013 Document Reviewed: 08/31/2012 [facility] Patient Information 2015 [place], [place]. This information is not intended to replace advice given to you by your health care provider. Make sure you discuss any questions you have with your health care provider.

## 2013-10-05 NOTE — ED Provider Notes (Addendum)
CSN: [id number]     Arrival date & time 10/05/13  1039 History   First MD Initiated Contact with Patient 10/05/13 1114     Chief Complaint  Patient presents with  . Vaginal Discharge   (Consider location/radiation/quality/duration/timing/severity/associated sxs/prior Treatment) HPI Comments: 38 year old female complaining of abdominal pain for 2 months. It is associated with a vaginal discharge but no bleeding. She denies nausea, vomiting, diarrhea or constipation. When pointing to the area pain she actually is pointing to the pelvis. LMP less than 2 weeks ago and right on time.   Past Medical History  Diagnosis Date  . Hypertension    Past Surgical History  Procedure Laterality Date  . Caesarean    . Tubal ligation     No family history on file. History  Substance Use Topics  . Smoking status: Never Smoker   . Smokeless tobacco: Not on file  . Alcohol Use: No   OB History   Grav Para Term Preterm Abortions TAB SAB Ect Mult Living                 Review of Systems  Constitutional: Negative.  Negative for fever.  HENT: Negative.   Respiratory: Negative.   Cardiovascular: Negative.   Gastrointestinal: Negative for nausea, vomiting, abdominal pain, diarrhea, constipation and abdominal distention.  Genitourinary: Positive for vaginal discharge and pelvic pain. Negative for dysuria, urgency, frequency, flank pain, decreased urine volume, difficulty urinating, genital sores and menstrual problem.  Musculoskeletal: Negative.   Neurological: Negative.     Allergies  Other  Home Medications   Prior to Admission medications   Medication Sig Start Date End Date Taking? Authorizing Provider  amoxicillin (AMOXIL) 500 MG capsule Take 1 capsule (500 mg total) by mouth 3 (three) times daily. 07/05/13   Tiffany Marilu Favre, PA-C  ibuprofen (ADVIL,MOTRIN) 400 MG tablet Take 1 tablet (400 mg total) by mouth every 6 (six) hours as needed. 07/05/13   Tiffany Marilu Favre, PA-C  metroNIDAZOLE  (FLAGYL) 500 MG tablet Take 1 tablet (500 mg total) by mouth 2 (two) times daily. X 7 days 10/05/13   Janne Napoleon, NP  naproxen sodium (ANAPROX) 220 MG tablet Take 400 mg by mouth every 6 (six) hours as needed (pain).    Historical Provider, MD   BP 125/88  Pulse 68  Temp(Src) 97.1 F (36.2 C) (Oral)  Resp 18  SpO2 96%  LMP 09/26/2013 Physical Exam  Nursing note and vitals reviewed. Constitutional: She is oriented to person, place, and time. She appears well-developed and well-nourished. No distress.  Neck: Normal range of motion. Neck supple.  Cardiovascular: Normal rate, regular rhythm, normal heart sounds and intact distal pulses.   Pulmonary/Chest: Effort normal and breath sounds normal. No respiratory distress. She has no wheezes. She has no rales.  Abdominal: Soft. Bowel sounds are normal. She exhibits no distension and no mass. There is no tenderness. There is no rebound and no guarding.  Genitourinary:  External pelvic exam reveals tenderness over the right, left in the pelvis. Pelvic exam: Normal external female genitalia. Small amount of thick white discharge coating the vaginal walls and pooling in the vaginal vault. The cervix is left of midline and high anterior. Uterus is posterior. Os is nulliparous. (History 3 C-sections). Cervix pink and without lesions to the posterior aspect. Unable to visualize the complete ectocervix or anterior portion of the cervix. Bimanual. Positive for cervical motion tenderness, right and left adnexal tenderness with left greater than right.   Musculoskeletal: She exhibits  no edema.  Neurological: She is alert and oriented to person, place, and time. No cranial nerve deficit. She exhibits normal muscle tone.  Skin: Skin is warm and dry.  Psychiatric:  Flat affect. Slow to speak and answer questions.    ED Course  Procedures (including critical care time) Labs Review Labs Reviewed  POCT URINALYSIS DIP (DEVICE) - Abnormal; Notable for the  following:    Leukocytes, UA TRACE (*)    All other components within normal limits  POCT PREGNANCY, URINE  CERVICOVAGINAL ANCILLARY ONLY     Results for orders placed during the hospital encounter of 10/05/13  POCT URINALYSIS DIP (DEVICE)      Result Value Ref Range   Glucose, UA NEGATIVE  NEGATIVE mg/dL   Bilirubin Urine NEGATIVE  NEGATIVE   Ketones, ur NEGATIVE  NEGATIVE mg/dL   Specific Gravity, Urine 1.025  1.005 - 1.030   Hgb urine dipstick NEGATIVE  NEGATIVE   pH 5.5  5.0 - 8.0   Protein, ur NEGATIVE  NEGATIVE mg/dL   Urobilinogen, UA 0.2  0.0 - 1.0 mg/dL   Nitrite NEGATIVE  NEGATIVE   Leukocytes, UA TRACE (*) NEGATIVE  POCT PREGNANCY, URINE      Result Value Ref Range   Preg Test, Ur NEGATIVE  NEGATIVE    Imaging Review No results found.   MDM   1. PID (acute pelvic inflammatory disease)   2. Vaginal discharge   3. Pelvic pain in female     Rocephin 250 mg IM Azithromycin 1 g by mouth Flagyl 500 twice a day per Rx Followup with PCP next week, listed on your Medicaid card. For worsening symptoms or problems and return. Cytology pending.    Janne Napoleon, NP 10/05/13 [place], NP 10/10/13 [phone]

## 2013-10-05 NOTE — ED Notes (Signed)
Pt c/o vag d/c onset 2 months Sx also include: abd pain  Also c/o rash on face and intermittent chest pain Alert w/no signs of acute distress.

## 2013-10-07 NOTE — ED Notes (Signed)
GC/Chlamydia neg., Affirm: Candida and Trich neg., Gardnerella pos.  Pt. adequately treated with Flagyl. Roselyn Meier 10/07/2013

## 2013-10-14 NOTE — ED Provider Notes (Signed)
Medical screening examination/treatment/procedure(s) were performed by a resident physician or non-physician practitioner and as the supervising physician I was immediately available for consultation/collaboration.  Lynne Leader, MD    Gregor Hams, MD 10/14/13 [phone]

## 2013-11-23 ENCOUNTER — Other Ambulatory Visit: Payer: Self-pay | Admitting: Radiology

## 2013-11-23 DIAGNOSIS — Z803 Family history of malignant neoplasm of breast: Secondary | ICD-10-CM

## 2013-11-23 DIAGNOSIS — N6320 Unspecified lump in the left breast, unspecified quadrant: Principal | ICD-10-CM

## 2013-11-23 DIAGNOSIS — N6452 Nipple discharge: Secondary | ICD-10-CM

## 2013-11-23 DIAGNOSIS — N6310 Unspecified lump in the right breast, unspecified quadrant: Secondary | ICD-10-CM

## 2013-12-02 ENCOUNTER — Other Ambulatory Visit: Payer: Medicaid Other

## 2013-12-05 ENCOUNTER — Ambulatory Visit
Admission: RE | Admit: 2013-12-05 | Discharge: 2013-12-05 | Disposition: A | Discharge status: Home / Self Care | Payer: Medicaid Other | Source: Ambulatory Visit | Attending: Radiology | Admitting: Radiology

## 2013-12-05 ENCOUNTER — Other Ambulatory Visit: Payer: Medicaid Other

## 2013-12-05 DIAGNOSIS — Z803 Family history of malignant neoplasm of breast: Secondary | ICD-10-CM

## 2013-12-05 DIAGNOSIS — N6452 Nipple discharge: Secondary | ICD-10-CM

## 2013-12-05 DIAGNOSIS — N6320 Unspecified lump in the left breast, unspecified quadrant: Principal | ICD-10-CM

## 2013-12-05 DIAGNOSIS — N6310 Unspecified lump in the right breast, unspecified quadrant: Secondary | ICD-10-CM

## 2013-12-05 MED ORDER — GADOBENATE DIMEGLUMINE 529 MG/ML IV SOLN
19.0000 mL | Freq: Once | INTRAVENOUS | Status: AC | PRN
  Administered 2013-12-05: 19 mL via INTRAVENOUS

## 2013-12-07 ENCOUNTER — Other Ambulatory Visit: Payer: Self-pay | Admitting: Radiology

## 2013-12-07 DIAGNOSIS — R928 Other abnormal and inconclusive findings on diagnostic imaging of breast: Secondary | ICD-10-CM

## 2013-12-16 ENCOUNTER — Ambulatory Visit
Admission: RE | Admit: 2013-12-16 | Discharge: 2013-12-16 | Disposition: A | Discharge status: Home / Self Care | Payer: Medicaid Other | Source: Ambulatory Visit | Attending: Radiology | Admitting: Radiology

## 2013-12-16 DIAGNOSIS — R928 Other abnormal and inconclusive findings on diagnostic imaging of breast: Secondary | ICD-10-CM

## 2013-12-16 MED ORDER — GADOBENATE DIMEGLUMINE 529 MG/ML IV SOLN
19.0000 mL | Freq: Once | INTRAVENOUS | Status: AC | PRN
  Administered 2013-12-16: 19 mL via INTRAVENOUS

## 2013-12-20 ENCOUNTER — Encounter: Payer: Self-pay | Admitting: *Deleted

## 2013-12-20 ENCOUNTER — Telehealth: Payer: Self-pay | Admitting: *Deleted

## 2013-12-20 DIAGNOSIS — C50319 Malignant neoplasm of lower-inner quadrant of unspecified female breast: Secondary | ICD-10-CM

## 2013-12-20 DIAGNOSIS — C50312 Malignant neoplasm of lower-inner quadrant of left female breast: Secondary | ICD-10-CM | POA: Insufficient documentation

## 2013-12-20 HISTORY — DX: Malignant neoplasm of lower-inner quadrant of unspecified female breast: C50.319

## 2013-12-20 NOTE — Telephone Encounter (Signed)
Confirmed [facility] for 12/28/13 at 0830.  Instructions and contact information given.

## 2013-12-28 ENCOUNTER — Encounter: Payer: Self-pay | Admitting: Physical Therapy

## 2013-12-28 ENCOUNTER — Encounter: Payer: Self-pay | Admitting: *Deleted

## 2013-12-28 ENCOUNTER — Encounter: Payer: Self-pay | Admitting: Hematology and Oncology

## 2013-12-28 ENCOUNTER — Other Ambulatory Visit (INDEPENDENT_AMBULATORY_CARE_PROVIDER_SITE_OTHER): Payer: Self-pay | Admitting: General Surgery

## 2013-12-28 ENCOUNTER — Ambulatory Visit
Admission: RE | Admit: 2013-12-28 | Discharge: 2013-12-28 | Disposition: A | Discharge status: Home / Self Care | Payer: Medicaid Other | Source: Ambulatory Visit | Attending: Radiation Oncology | Admitting: Radiation Oncology

## 2013-12-28 ENCOUNTER — Ambulatory Visit: Payer: Medicaid Other

## 2013-12-28 ENCOUNTER — Ambulatory Visit: Payer: Medicaid Other | Attending: General Surgery | Admitting: Physical Therapy

## 2013-12-28 ENCOUNTER — Other Ambulatory Visit (HOSPITAL_BASED_OUTPATIENT_CLINIC_OR_DEPARTMENT_OTHER): Payer: Medicaid Other

## 2013-12-28 ENCOUNTER — Ambulatory Visit (HOSPITAL_BASED_OUTPATIENT_CLINIC_OR_DEPARTMENT_OTHER): Payer: Medicaid Other | Admitting: Hematology and Oncology

## 2013-12-28 ENCOUNTER — Encounter: Payer: Self-pay | Admitting: Dietician

## 2013-12-28 VITALS — BP 122/83 | HR 67 | Temp 98.2°F | Resp 18 | Ht 65.0 in | Wt 206.9 lb

## 2013-12-28 DIAGNOSIS — D241 Benign neoplasm of right breast: Secondary | ICD-10-CM

## 2013-12-28 DIAGNOSIS — M439 Deforming dorsopathy, unspecified: Secondary | ICD-10-CM | POA: Diagnosis not present

## 2013-12-28 DIAGNOSIS — C50512 Malignant neoplasm of lower-outer quadrant of left female breast: Secondary | ICD-10-CM | POA: Diagnosis not present

## 2013-12-28 DIAGNOSIS — C50312 Malignant neoplasm of lower-inner quadrant of left female breast: Secondary | ICD-10-CM

## 2013-12-28 DIAGNOSIS — E119 Type 2 diabetes mellitus without complications: Secondary | ICD-10-CM | POA: Insufficient documentation

## 2013-12-28 DIAGNOSIS — I10 Essential (primary) hypertension: Secondary | ICD-10-CM | POA: Insufficient documentation

## 2013-12-28 LAB — CBC WITH DIFFERENTIAL/PLATELET
BASO%: 0.5 % (ref 0.0–2.0)
BASOS ABS: 0 10*3/uL (ref 0.0–0.1)
EOS ABS: 0.4 10*3/uL (ref 0.0–0.5)
EOS%: 5.3 % (ref 0.0–7.0)
HCT: 37 % (ref 34.8–46.6)
HEMOGLOBIN: 12.2 g/dL (ref 11.6–15.9)
LYMPH%: 36.1 % (ref 14.0–49.7)
MCH: 30.1 pg (ref 25.1–34.0)
MCHC: 33 g/dL (ref 31.5–36.0)
MCV: 91.3 fL (ref 79.5–101.0)
MONO#: 0.5 10*3/uL (ref 0.1–0.9)
MONO%: 6 % (ref 0.0–14.0)
NEUT%: 52.1 % (ref 38.4–76.8)
NEUTROS ABS: 4.3 10*3/uL (ref 1.5–6.5)
PLATELETS: 309 10*3/uL (ref 145–400)
RBC: 4.05 10*6/uL (ref 3.70–5.45)
RDW: 12.8 % (ref 11.2–14.5)
WBC: 8.2 10*3/uL (ref 3.9–10.3)
lymph#: 3 10*3/uL (ref 0.9–3.3)

## 2013-12-28 LAB — COMPREHENSIVE METABOLIC PANEL (CC13)
ALK PHOS: 39 U/L — AB (ref 40–150)
ALT: 12 U/L (ref 0–55)
AST: 15 U/L (ref 5–34)
Albumin: 3.8 g/dL (ref 3.5–5.0)
Anion Gap: 8 mEq/L (ref 3–11)
BILIRUBIN TOTAL: 0.47 mg/dL (ref 0.20–1.20)
BUN: 10.7 mg/dL (ref 7.0–26.0)
CO2: 25 mEq/L (ref 22–29)
Calcium: 9.2 mg/dL (ref 8.4–10.4)
Chloride: 106 mEq/L (ref 98–109)
Creatinine: 0.9 mg/dL (ref 0.6–1.1)
GLUCOSE: 97 mg/dL (ref 70–140)
POTASSIUM: 4 meq/L (ref 3.5–5.1)
Sodium: 138 mEq/L (ref 136–145)
Total Protein: 7.4 g/dL (ref 6.4–8.3)

## 2013-12-28 NOTE — Assessment & Plan Note (Signed)
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 After reviewing the pathology in detail, we proceeded to discuss the different treatment options between surgery, radiation, chemotherapy, antiestrogen therapies.  Recommendation: genetic counseling followed by Surgery followed by oncology discussion regarding Oncotype DX testing to determine if chemotherapy would be needed. Followed by radiation therapy if needed followed by antiestrogen therapy with tamoxifen for 10 years  I briefly discussed with her that majority of patients with small tumors that did not involve lymph nodes, did not need chemotherapy. I would like to await the final pathology report regarding the size of the cancer before ordering Oncotype DX testing. I would like to see her back after surgery to discuss the future treatment plan whether it is with chemotherapy or radiation on antiestrogen therapy.  Social issues:Patient is here today with her pastor who is been very helpful. She has 3 children and she appears to be under employed working part-time jobs. If she does not undergo bilateral mastectomies, she would need bilateral breast MRIs annually for surveillance. This would be even more important if she has any BRCA mutation.

## 2013-12-28 NOTE — Patient Instructions (Signed)
Physical Therapy Information for After Breast Cancer Surgery/Treatment:   Lymphedema is a swelling condition that you may be at risk for in your arm if you have lymph nodes removed from the armpit area.  After a sentinel node biopsy, the risk is approximately 5-9% and is higher after an axillary node dissection.  There is treatment available for this condition and it is not life-threatening.  Contact your physician or physical therapist with concerns.  You may begin the 4 shoulder/posture exercises (see additional sheet) when permitted by your physician (typically a week after surgery).  If you have drains, you may need to wait until those are removed before beginning range of motion exercises.  A general recommendation is to not lift your arms above shoulder height until drains are removed.  These exercises should be done to your tolerance and gently.  This is not a "no pain/no gain" type of recovery so listen to your body and stretch into the range of motion that you can tolerate, stopping if you have pain.  If you are having immediate reconstruction, ask your plastic surgeon about doing exercises as he or she may want you to wait.  We encourage you to attend the free one time ABC (After Breast Cancer) class offered by [facility].  You will learn information related to lymphedema risk, prevention and treatment and additional exercises to regain mobility following surgery.  You can call [phone] for more information.  This is offered the 1st and 3rd Monday of each month.  You only attend the class one time.  While undergoing any medical procedure or treatment, try to avoid blood pressure being taken or needle sticks from occurring on the arm on the side of cancer.   This recommendation begins after surgery and continues for the rest of your life.  This may help reduce your risk of getting lymphedema (swelling in your arm).  An excellent resource for those seeking  information on lymphedema is the National Lymphedema Network's web site. It can be accessed at www.lymphnet.org  If you notice swelling in your hand, arm or breast at any time following surgery (even if it is many years from now), please contact your doctor or physical therapist to discuss this.  Lymphedema can be treated at any time but it is easier for you if it is treated early on.  If you feel like your shoulder motion is not returning to normal in a reasonable amount of time, please contact your surgeon or physical therapist.  Marti C. Smith, PT, CLT [phone]; [address] [place], [place] [address] ABC CLASS After Breast Cancer Class  After Breast Cancer Class is a specially designed exercise class to assist you in a safe recover after having breast cancer surgery.  In this class you will learn how to get back to full function whether your drains were just removed or if you had surgery a month ago.  This one-time class is held the 1st and 3rd Monday of every month from 11:00 a.m. until 12:00 noon at the [facility] located at [address] [place], [place] [address]  This class is FREE and space is limited. For more information or to register for the next available class, call [phone].  Class Goals   Understand specific stretches to improve the flexibility of you chest and shoulder.  Learn ways to safely strengthen your upper body and improve your posture.  Understand the warning signs of infection and why   you may be at risk for an arm infection.  Learn about Lymphedema and prevention.  ** You do not attend this class until after surgery.  Drains must be removed to participate  Patient was instructed today in a home exercise program today for post op shoulder range of motion. She was encouraged to do these when permitted by her physician. 

## 2013-12-28 NOTE — Progress Notes (Signed)
MD note created during office visit sent to scan.  Copy to patient.   

## 2013-12-28 NOTE — Progress Notes (Signed)
Patient was seen by RD during [facility] on 12/28/13.  Provided pt with folder of educational materials regarding general nutrition recommendations for breast cancer patients, plant-based diets, antioxidants, cancer facts vs myths, and information on organic foods  Explained importance of healthy nutrition during treatments and encouraged pt to consume daily recommended amount of fruits and vegetables, emphasizing variety of intake for maximum antioxidant and synergistic health benefits. Promoted adequate fiber intake, with use of whole grain and whole wheat products, beans, and lentils. Encouraged patient to follow a low fat diet with use of heart healthy fats, and to opt for plant-based proteins weekly  Recommended pt maintain healthy weight during treatments, and encouraged gradual weight loss as warranted after procedures.  Diet recall indicated that patient had begun to eat healthier.  Reports that general MD had put her on a strict diet with increased vegetables, no bread.  Patient had questions regarding healthy nutrition, weight loss, diet.  Expect good compliance  Provided pt with outpatient oncology RD contact information. Encouraged pt to contact RD with additional follow up questions or nutrition-related concerns.  [name], RD, LDN Clinical Inpatient Dietitian Pager:  [phone] Weekend and after hours pager:  [phone]

## 2013-12-28 NOTE — Progress Notes (Signed)
[facility] NEW PATIENT EVALUATION  Name: Janice Crawford MRN: [id number]  Date:   12/28/2013           DOB: Jul 01, 1975  Status: outpatient   CC: No PCP Per Patient  [name], MD    REFERRING PHYSICIAN: [name], MD   DIAGNOSIS:  Clinical stage I (T1 N0 M0) invasive lobular carcinoma of the left breast  HISTORY OF PRESENT ILLNESS:  Janice Crawford is a 38 y.o. female who is seen today through the courtesy of Dr. Barry Dienes at the [facility] for evaluation of her T1 N0 invasive lobular carcinoma of the left breast. She presented with bilateral nipple discharge and bilateral breast lumps. Multiple masses were seen in both breasts. Ultrasound on 11/22/2013 showed multiple cysts and breast MR was recommended for further evaluation. Breast MR on 12/05/2013 showed a 1.3 cm area of enhancement within the lower inner quadrant of the right breast and within the left breast a 1.1 x 0.5 x 0.8 cm irregular enhancing mass along the lower inner aspect. 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 Her invasive lobular carcinoma was ER positive and PR positive at 90% each with a proliferation index of 20%. She is without complaints today. She seen today with "her minister". She seen in clinic with Dr. Lindi Adie and Dr. Barry Dienes.  PREVIOUS RADIATION THERAPY: No   PAST MEDICAL HISTORY:  has a past medical history of Hypertension; Cancer of lower-inner quadrant of female breast (12/20/2013); and Diabetes mellitus without complication.     PAST SURGICAL HISTORY:  Past Surgical History  Procedure Laterality Date  . Caesarean    . Tubal ligation       FAMILY HISTORY: family history includes Breast cancer in her mother. Her mother was diagnosed with breast cancer to age 32, and she died of metastatic cancer at 73.   SOCIAL HISTORY:  reports that she quit smoking  about 9 years ago. She does not have any smokeless tobacco history on file. She reports that she drinks alcohol. She reports that she does not use illicit drugs. Never married, 3 children ages 71, 60, and 65. She previously worked in [employer] and [employer]. Currently unemployed.   ALLERGIES: Other   MEDICATIONS:  Current Outpatient Prescriptions  Medication Sig Dispense Refill  . amoxicillin (AMOXIL) 500 MG capsule Take 1 capsule (500 mg total) by mouth 3 (three) times daily. 21 capsule 0  . ibuprofen (ADVIL,MOTRIN) 400 MG tablet Take 1 tablet (400 mg total) by mouth every 6 (six) hours as needed. 30 tablet 0  . metroNIDAZOLE (FLAGYL) 500 MG tablet Take 1 tablet (500 mg total) by mouth 2 (two) times daily. X 7 days 14 tablet 0  . naproxen sodium (ANAPROX) 220 MG tablet Take 400 mg by mouth every 6 (six) hours as needed (pain).     No current facility-administered medications for this encounter.     REVIEW OF SYSTEMS:  Pertinent items are noted in HPI.    PHYSICAL EXAM: Alert and oriented 38 year old after American female appearing her stated age. Wt Readings from Last 3 Encounters:  12/28/13 206 lb 14.4 oz (93.849 kg)  07/05/13 205 lb 12.8 oz (93.35 kg)   Temp Readings from Last 3 Encounters:  12/28/13 98.2 F (36.8 C) Oral  10/05/13 97.1 F (36.2 C) Oral  07/05/13 97.3 F (36.3 C) Oral   BP Readings from  Last 3 Encounters:  12/28/13 122/83  10/05/13 125/88  07/05/13 128/81   Pulse Readings from Last 3 Encounters:  12/28/13 67  10/05/13 68  07/05/13 79   Head and neck examination: Grossly unremarkable. Nodes: Without palpable cervical, supraclavicular, or axillary lymphadenopathy. Chest: Lungs clear. Breasts: There are punctate biopsy wounds at approximately 7:00 along the lower outer quadrant of the right breast, and at 3:00 along the lateral left breast. No discreet masses are appreciated. Extremities: Without edema.    LABORATORY DATA:  Lab Results  Component  Value Date   WBC 8.2 12/28/2013   HGB 12.2 12/28/2013   HCT 37.0 12/28/2013   MCV 91.3 12/28/2013   PLT 309 12/28/2013   Lab Results  Component Value Date   NA 138 12/28/2013   K 4.0 12/28/2013   CL 103 07/06/2011   CO2 25 12/28/2013   Lab Results  Component Value Date   ALT 12 12/28/2013   AST 15 12/28/2013   ALKPHOS 39* 12/28/2013   BILITOT 0.47 12/28/2013      IMPRESSION: clinical stage I (T1 N0 M0) invasive lobular carcinoma of the left breast. We discussed breast preservation versus mastectomy. She is convinced that she is cancer free following her biopsy. I emphasized the need to at least consider bilateral partial mastectomies to further define her disease extent along with a left sentinel lymph node biopsy. Her disease would be difficult to follow and will require follow-up MRI scans. She may not have the resources to provide adequate follow-up. She may be better suited for mastectomy. I discussed the potential acute and late toxicities of radiation therapy. She will need genetic testing/consultation. She will also need Education officer, museum consultation.  She will see Dr. Barry Dienes for discussion of surgery later this morning.   PLAN: As discussed above.   I spent 30 minutes minutes face to face with the patient and more than 50% of that time was spent in counseling and/or coordination of care.

## 2013-12-28 NOTE — Therapy (Signed)
Physical Therapy Evaluation  Patient Details  Name: Janice Crawford MRN: [id number] Date of Birth: 10-22-1975  Encounter Date: 12/28/2013      PT End of Session - 12/28/13 1521    Visit Number 1   Number of Visits 1   PT Start Time 1102   PT Stop Time 1135   PT Time Calculation (min) 33 min   Activity Tolerance Patient tolerated treatment well      Past Medical History  Diagnosis Date  . Hypertension   . Cancer of lower-inner quadrant of female breast 12/20/2013  . Diabetes mellitus without complication     Past Surgical History  Procedure Laterality Date  . Caesarean    . Tubal ligation      There were no vitals taken for this visit.  Visit Diagnosis:  Carcinoma of lower outer quadrant of breast, left  Postural deformity      Subjective Assessment - 12/28/13 1336    Symptoms Patient was seen today for a pre-operative baseline assessment for her left breast cancer.   Pertinent History Patient was diagnosed with left breast on 12/05/13.  Cancer is ER/PR positive and HER2 negative with a Ki67 of 20%.   Currently in Pain? Yes   Pain Score 4    Pain Location Shoulder  Also c/o same pain in legs   Pain Orientation Right;Left   Pain Descriptors / Indicators Aching   Pain Type Chronic pain   Pain Onset More than a month ago   Pain Frequency Intermittent   Aggravating Factors  stress   Pain Relieving Factors rest   Effect of Pain on Daily Activities She states when she is stressed, she feels tension in her shoulders   Multiple Pain Sites No          [facility] PT Assessment - 12/28/13 1500    Assessment   Medical Diagnosis left breast cancer   Onset Date 12/05/13   Prior Therapy none   Precautions   Precautions Other (comment)  active cancer   Balance Screen   Has the patient fallen in the past 6 months No   Has the patient had a decrease in activity level because of a fear of falling?  No   Is the patient reluctant to leave their home because of a fear of  falling?  No   Home Environment   Family/patient expects to be discharged to: Private residence   Prior Function   Level of Independence Independent with basic ADLs   Vocation Unemployed   Leisure Patient does not exercise.   Cognition   Overall Cognitive Status Within Functional Limits for tasks assessed   Posture/Postural Control   Posture/Postural Control Postural limitations   Postural Limitations forward head and rounded shoulders posture   AROM   Right Shoulder Extension 58 Degrees   Right Shoulder Flexion 147 Degrees   Right Shoulder ABduction 151 Degrees   Right Shoulder Internal Rotation 53 Degrees   Right Shoulder External Rotation 77 Degrees   Left Shoulder Extension 59 Degrees   Left Shoulder Flexion 135 Degrees   Left Shoulder ABduction 136 Degrees   Left Shoulder Internal Rotation 62 Degrees   Left Shoulder External Rotation 71 Degrees            Education - 12/28/13 1520    Education provided Yes   Education Details Home exercise program for post op shoulder range of motion exercises.   Education Details Patient;Caregiver(s)   Methods Explanation;Demonstration;Handout   Comprehension Verbalized understanding;Returned demonstration  Plan - 12/28/13 1521    Clinical Impression Statement Patient was seen today for a baseline assessment for left invasive lobular breast cancer.  She is planning to have a left lumpectomy and sentinel node biopsy and also a right lumpectomy for a papiloma.  She will likely have radiation and anti-estrogen therapy after that.     Pt will benefit from skilled therapeutic intervention in order to improve on the following deficits Increased edema;Decreased range of motion;Decreased knowledge of precautions;Impaired UE functional use   Rehab Potential Excellent   Clinical Impairments Affecting Rehab Potential none   PT Frequency Other (Comment)  Eval only   PT Treatment/Interventions Therapeutic  exercise;Patient/family education   PT Plan Patient will follow up as needed with PT after surgery.        Problem List Patient Active Problem List   Diagnosis Date Noted  . Cancer of lower-inner quadrant of left female breast 12/20/2013            LYMPHEDEMA/ONCOLOGY QUESTIONNAIRE - 12/28/13 1500    Cancer Type Left invasive lobular breast cancer   10 cm Proximal to Olecranon Process 32 cm   Olecranon Process 27.2 cm   10 cm Proximal to Ulnar Styloid Process 22.8 cm   Just Proximal to Ulnar Styloid Process 16.8 cm   Across Hand at [employer] 19.1 cm   At [place] of 2nd Digit 6.3 cm   10 cm Proximal to Olecranon Process 30.4 cm   Olecranon Process 27 cm   10 cm Proximal to Ulnar Styloid Process 22 cm   Just Proximal to Ulnar Styloid Process 16.5 cm   Across Hand at [employer] 18.7 cm   At [place] of 2nd Digit 6.2 cm             Breast Clinic Goals - 12/28/13 1527    Patient will be able to verbalize understanding of pertinent lymphedema risk reduction practices relevant to her diagnosis specifically related to skin care.   Baseline No knowledge   Time 1   Period Days   Status Achieved   Patient will be able to return demonstrate and/or verbalize understanding of the post-op home exercise program related to regaining shoulder range of motion.   Baseline No knowledge   Time 1   Period Days   Status Achieved   Patient will be able to verbalize understanding of the importance of attending the postoperative After Breast Cancer Class for further lymphedema risk reduction education and therapeutic exercise.   Baseline No knowledge   Time 1   Period Days   Status Achieved       Patient will follow up at outpatient cancer rehab if needed following surgery.  If the patient requires physical therapy at that time, a specific plan will be dictated and sent to the referring physician for approval. The patient was educated today on appropriate basic range of motion  exercises to begin post operatively and the importance of attending the After Breast Cancer class following surgery.  Patient was educated today on lymphedema risk reduction practices as it pertains to recommendations that will benefit the patient immediately following surgery.  She verbalized good understanding.  No additional physical therapy is indicated at this time.       Rendon Howell,Janice Crawford 12/28/2013, 3:44 PM

## 2013-12-28 NOTE — Progress Notes (Signed)
  [place] Psychosocial Distress Screening Clinical Social Work  Patient completed distress screening protocol and scored a 8 on the Psychosocial Distress Thermometer which indicates moderate distress. Clinical Social Worker met with patient and patient's pastor in [facility] to assess for distress and other psychosocial needs.  Patient stated she was no distress associated with her cancer diagnosis, and her distress was a result of her housing situation.  CSW and patient discussed common emotional response to being diagnosed with cancer and the importance of support.  Patient stated her only means of support were her pastor and church family.  CSW informed patient of the support team and support services at [facility].  CSw and patient also discussed patients financial concerns and resources available.  CSW encouraged patient to meet with the financial advocate today to initiate the application for the [employer].  Patient is currently living in a hotel and has no plans/options for long term housing.  Patient stated she was previously on the list for the housing authority, but was removed from the list after she did not respond to a letter that was sent to an old address.  Patient reported she had been to the housing authority and coalition multiple times in the past month, but has been unable to get back on the list due to "freeze" in accepting new applications.  CSW encouraged patient to contact other community resources (salvation army, urban ministry, Sport and exercise psychologist); which she stated she had already done.  CSw and patient discussed the most effective way to use the [employer], her SSI and community resources to establish long term housing.  CSW and financial advocate will continue to follow and support.  CSW encourage patient to call questions or concerns.             Clinical Social Worker follow up needed: Yes.    If yes, follow up plan: Housing follow up financial resources   [place] 12/28/2013   Screening Type Initial Screening  Distress experienced in past week (1-10) 8  Practical problem type Housing  Family Problem type Children  Physician notified of physical symptoms Yes  Referral to clinical social work Yes  Referral to financial advocate Yes  Referral to support programs Yes   Johnnye Lana, MSW, LCSW, OSW-C Clinical Social Worker [place] [phone]

## 2013-12-28 NOTE — Progress Notes (Signed)
[place] CONSULT NOTE  Patient Care Team: No Pcp Per Patient as PCP - General (General Practice) [name], MD as Consulting Physician (General Surgery) Rulon Eisenmenger, MD as Consulting Physician (Hematology and Oncology) Rexene Edison, MD as Consulting Physician (Radiation Oncology)  CHIEF COMPLAINTS/PURPOSE OF CONSULTATION:  Newly diagnosed breast cancer  HISTORY OF PRESENTING ILLNESS:  Janice Crawford 38 y.o. female is here because of recent diagnosis of left breast cancer. She had a left nipple discharge for the past couple of years. She has felt abnormal lumps or nodules in the breast for a while. She underwent mammogram which revealed abnormalities that led to a biopsy on 12/16/2013. She had a breast MRI to 13,015 that revealed 1.3 cm area in the right breast and 1.1 cm area in the left breast abnormalities. She underwent MRI guided biopsy of both of these. 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 She was presented this morning in the multidisciplinary tumor board and she is here today to discuss the overall treatment plan.  I reviewed her records extensively and collaborated the history with the patient.  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  She was never  exposed to fertility medications or hormone replacement therapy.  She has  family history of Breast/GYN/GI cancer; her mother was diagnosed at age 61 who died at age 39 with metastatic breast cancer  MEDICAL HISTORY:  Past Medical History  Diagnosis Date  . Hypertension   . Cancer of lower-inner quadrant of female breast 12/20/2013  . Diabetes mellitus without complication     SURGICAL HISTORY: Past Surgical History  Procedure Laterality Date  . Caesarean    . Tubal ligation      SOCIAL HISTORY: History   Social History  . Marital Status: Single    Spouse Name: N/A    Number of Children: N/A  . Years of Education: N/A   Occupational History  . Not on file.   Social History Main Topics  . Smoking status: Former Smoker    Quit date: 12/28/2004  . Smokeless tobacco: Not on file  . Alcohol Use: Yes     Comment: occasional  . Drug Use: No  . Sexual Activity: Yes    Birth Control/ Protection: None   Other Topics Concern  . Not on file   Social History Narrative    FAMILY HISTORY: Family History  Problem Relation Age of Onset  . Breast cancer Mother     ALLERGIES:  is allergic to other.  MEDICATIONS:  Current Outpatient Prescriptions  Medication Sig Dispense Refill  . amoxicillin (AMOXIL) 500 MG capsule Take 1 capsule (500 mg total) by mouth 3 (three) times daily. 21 capsule 0  . ibuprofen (ADVIL,MOTRIN) 400 MG tablet Take  1 tablet (400 mg total) by mouth every 6 (six) hours as needed. 30 tablet 0  . metroNIDAZOLE (FLAGYL) 500 MG tablet Take 1 tablet (500 mg total) by mouth 2 (two) times daily. X 7 days 14 tablet 0  . naproxen sodium (ANAPROX) 220 MG tablet Take 400 mg by mouth every 6 (six) hours as needed (pain).     No current facility-administered medications for this visit.    REVIEW OF SYSTEMS:   Constitutional: Denies fevers, chills or abnormal night sweats Eyes: Denies blurriness of vision, double vision or watery eyes Ears, nose, mouth, throat,  and face: Denies mucositis or sore throat Respiratory: Denies cough, dyspnea or wheezes Cardiovascular: Denies palpitation, chest discomfort or lower extremity swelling Gastrointestinal:  Denies nausea, heartburn or change in bowel habits Skin: Denies abnormal skin rashes Lymphatics: Denies new lymphadenopathy or easy bruising Neurological:Denies numbness, tingling or new weaknesses Behavioral/Psych: Mood is stable, no new changes  Breast: nipple discharge and palpable lumps in both breasts All other systems were reviewed with the patient and are negative.  PHYSICAL EXAMINATION: ECOG PERFORMANCE STATUS: 1 - Symptomatic but completely ambulatory  Filed Vitals:   12/28/13 0918  BP: 122/83  Pulse: 67  Temp: 98.2 F (36.8 C)  Resp: 18   Filed Weights   12/28/13 0918  Weight: 206 lb 14.4 oz (93.849 kg)    GENERAL:alert, no distress and comfortable SKIN: skin color, texture, turgor are normal, no rashes or significant lesions EYES: normal, conjunctiva are pink and non-injected, sclera clear OROPHARYNX:no exudate, no erythema and lips, buccal mucosa, and tongue normal  NECK: supple, thyroid normal size, non-tender, without nodularity LYMPH:  no palpable lymphadenopathy in the cervical, axillary or inguinal LUNGS: clear to auscultation and percussion with normal breathing effort HEART: regular rate & rhythm and no murmurs and no lower extremity edema ABDOMEN:abdomen soft, non-tender and normal bowel sounds Musculoskeletal:no cyanosis of digits and no clubbing  PSYCH: alert & oriented x 3 with fluent speech NEURO: no focal motor/sensory deficits BREAST:palpable nodules which appear to be cysts based on the MRI and mammograms in both breasts. No palpable axillary or supraclavicular lymphadenopathy  LABORATORY DATA:  I have reviewed the data as listed Lab Results  Component Value Date   WBC 8.2 12/28/2013   HGB 12.2 12/28/2013   HCT 37.0 12/28/2013   MCV 91.3 12/28/2013   PLT  309 12/28/2013   Lab Results  Component Value Date   NA 138 12/28/2013   K 4.0 12/28/2013   CL 103 07/06/2011   CO2 25 12/28/2013    RADIOGRAPHIC STUDIES: I have personally reviewed the radiological reports and agreed with the findings in the report.report summarized as above  ASSESSMENT AND PLAN:  Cancer of lower-inner quadrant of left female breast Left breast invasive lobular cancer with LCIS 11 mm size ER 90% PR 90% HER-2 negative Ki-67 20%; right breast with intraductal papilloma  Pathology counseling:Discussed with the patient, the details of pathology including the type of breast cancer,the clinical staging, the significance of ER, PR and HER-2/neu receptors and the implications for treatment. After reviewing the pathology in detail, we proceeded to discuss the different treatment options between surgery, radiation, chemotherapy, antiestrogen therapies.  Recommendation: genetic counseling followed by Surgery followed by oncology discussion regarding Oncotype DX testing to determine if chemotherapy would be needed. Followed by radiation therapy if needed followed by antiestrogen therapy with tamoxifen for 10 years  I briefly discussed with her that majority of patients with small tumors that did not involve  lymph nodes, did not need chemotherapy. I would like to await the final pathology report regarding the size of the cancer before ordering Oncotype DX testing. I would like to see her back after surgery to discuss the future treatment plan whether it is with chemotherapy or radiation on antiestrogen therapy.  Social issues:Patient is here today with her pastor who is been very helpful. She has 3 children and she appears to be under employed working part-time jobs. If she does not undergo bilateral mastectomies, she would need bilateral breast MRIs annually for surveillance. This would be even more important if she has any BRCA mutation.   All questions were answered. The patient  knows to call the clinic with any problems, questions or concerns. I spent 40 minutes counseling the patient face to face. The total time spent in the appointment was 60 minutes and more than 50% was on counseling.     Rulon Eisenmenger, MD 12/28/2013 11:31 AM

## 2013-12-28 NOTE — Addendum Note (Signed)
Addended by: Prentiss Bells on: 12/28/2013 01:40 PM   Modules accepted: Medications

## 2013-12-28 NOTE — Progress Notes (Signed)
Checked in new patient. She is living in hotel now--looking for housing. Advised her to make sure she sees a Education officer, museum for other resources. She has appt card and I gave her breast care [facility] packet. She has not been traveling. She has Lenise's card and will get her income to her for Alight asst.

## 2013-12-29 ENCOUNTER — Ambulatory Visit (HOSPITAL_BASED_OUTPATIENT_CLINIC_OR_DEPARTMENT_OTHER): Payer: Medicaid Other | Admitting: Genetic Counselor

## 2013-12-29 ENCOUNTER — Encounter: Payer: Self-pay | Admitting: Hematology and Oncology

## 2013-12-29 ENCOUNTER — Encounter: Payer: Self-pay | Admitting: Genetic Counselor

## 2013-12-29 ENCOUNTER — Other Ambulatory Visit: Payer: Medicaid Other

## 2013-12-29 DIAGNOSIS — Z803 Family history of malignant neoplasm of breast: Secondary | ICD-10-CM

## 2013-12-29 DIAGNOSIS — Z315 Encounter for genetic counseling: Secondary | ICD-10-CM

## 2013-12-29 DIAGNOSIS — C50312 Malignant neoplasm of lower-inner quadrant of left female breast: Secondary | ICD-10-CM

## 2013-12-29 NOTE — Progress Notes (Signed)
Dr.  Nicholas Lose requested a consultation for genetic counseling and risk assessment for Janice Crawford, a 38 y.o. female, for discussion of her personal and family history of breast cancer.  She presents to clinic today to discuss the possibility of a genetic predisposition to cancer, and to further clarify her risks, as well as her family members' risks for cancer.   HISTORY OF PRESENT ILLNESS: In 2015, at the age of 47, Janice Crawford was diagnosed with invasive ductal carcinoma of the left breast. This will be treated with surgery.  She is unsure whether she will have chemotherapy.  Janice Crawford has not had a colonoscopy yet.    Past Medical History  Diagnosis Date  . Hypertension   . Cancer of lower-inner quadrant of female breast 12/20/2013  . Diabetes mellitus without complication     Past Surgical History  Procedure Laterality Date  . Caesarean    . Tubal ligation      History   Social History  . Marital Status: Single    Spouse Name: N/A    Number of Children: N/A  . Years of Education: N/A   Social History Main Topics  . Smoking status: Former Smoker    Types: Cigars, Cigarettes    Quit date: 12/28/2004  . Smokeless tobacco: None  . Alcohol Use: Yes     Comment: occasional  . Drug Use: No  . Sexual Activity: Yes    Birth Control/ Protection: None   Other Topics Concern  . None   Social History Narrative    REPRODUCTIVE HISTORY AND PERSONAL RISK ASSESSMENT FACTORS: Menarche was at age 68.   premenopausal Uterus Intact: yes Ovaries Intact: yes G3P3A0, first live birth at age 72  She has not previously undergone treatment for infertility.   Oral Contraceptive use: 1 years   She has not used HRT in the past.    FAMILY HISTORY:  We obtained a detailed, 4-generation family history.  Significant diagnoses are listed below: Family History  Problem Relation Age of Onset  . Breast cancer Mother 38  . Cancer Brother 49    NOS    Patient's  maternal ancestors are of [place], Caucasian and [place] descent, and paternal ancestors are of African American descent. There is no reported Ashkenazi Jewish ancestry. There is no known consanguinity.  GENETIC COUNSELING ASSESSMENT: Janice Crawford is a 38 y.o. female with a personal and family history of breast cancer which somewhat suggestive of a hereditary breast cancer syndrome and predisposition to cancer. We, therefore, discussed and recommended the following at today's visit.   DISCUSSION: We reviewed the characteristics, features and inheritance patterns of hereditary cancer syndromes. We also discussed genetic testing, including the appropriate family members to test, the process of testing, insurance coverage and turn-around-time for results. We reviewed hereditary cancer syndromes that are most consistent with her family history, including BRCA mutations and other high risk genes.  We discussed that knowing what type of cancer her brother had at age 64 could help [place] target testing further.  We discussed the inheritance pattern for hereditary cancer syndromes and that if she tests negative her daughter needs to start mammogram screening 10 years younger than her age of onset, so that would be around age 75-26.  PLAN: After considering the risks, benefits, and limitations, Janice Crawford provided informed consent to pursue genetic testing and the blood sample will be  be sent to [facility] for analysis of the [facility]  panel test. We discussed the implications of a positive, negative and/ or variant of uncertain significance genetic test result. Results should be available within approximately 2-4 weeks' time (although we indicated that results were needed for surgical decisions and will hopefully be in sooner), at which point they will be disclosed by telephone to Janice Crawford, as will any additional recommendations warranted by these results. Janice Crawford  will receive a summary of her genetic counseling visit and a copy of her results once available. This information will also be available in Epic. We encouraged Janice Crawford to remain in contact with cancer genetics annually so that we can continuously update the family history and inform her of any changes in cancer genetics and testing that may be of benefit for her family. Janice Crawford's questions were answered to her satisfaction today. Our contact information was provided should additional questions or concerns arise.  The patient was seen for a total of 45 minutes, greater than 50% of which was spent face-to-face counseling.  This note will also be sent to the referring provider via the electronic medical record. The patient will be supplied with a summary of this genetic counseling discussion as well as educational information on the discussed hereditary cancer syndromes following the conclusion of their visit.   _______________________________________________________________________ For Office Staff:  Number of people involved in session: 1 Was an Intern/ student involved with case: no

## 2013-12-29 NOTE — Progress Notes (Signed)
Called pt to discuss the Alight approval.  Pt's mailbox was full so couldn't leave a message.

## 2013-12-30 ENCOUNTER — Telehealth: Payer: Self-pay | Admitting: *Deleted

## 2013-12-30 NOTE — Telephone Encounter (Signed)
Faxed care plan & office notes to [facility] at [place].

## 2014-01-03 ENCOUNTER — Telehealth: Payer: Self-pay | Admitting: *Deleted

## 2014-01-03 NOTE — Telephone Encounter (Signed)
Attempted to call pt to f/u from [facility] on 12/28/13. Pt phone line busy. Will try again.

## 2014-01-06 ENCOUNTER — Telehealth: Payer: Self-pay | Admitting: Genetic Counselor

## 2014-01-06 NOTE — Telephone Encounter (Signed)
Patient called asking if her results were back. I explained that her results will take about 2-4 weeks to get back, which would be between 11/24 and 12/4.  Her phone had been turned off for awhile so she was worried whether we had tried to get ahold of her. I noticed that [place] had tried but her line was busy.  I will let Dawn know that she can get back with patient as her phone is turned on.

## 2014-01-17 ENCOUNTER — Telehealth: Payer: Self-pay | Admitting: *Deleted

## 2014-01-17 NOTE — Telephone Encounter (Signed)
Called pt to f/u and assess needs. Pt denies questions, concerns or needs at this time. Pt anxious for genetic results to come back so she can "get this thing behind me". Informed pt that hopefully they will be back sometime this week. Received verbal understanding. Encourage pt to call with needs. Received verbal understanding. Contact information given.

## 2014-02-01 ENCOUNTER — Telehealth: Payer: Self-pay | Admitting: Genetic Counselor

## 2014-02-01 DIAGNOSIS — C50919 Malignant neoplasm of unspecified site of unspecified female breast: Secondary | ICD-10-CM

## 2014-02-01 DIAGNOSIS — Z1589 Genetic susceptibility to other disease: Principal | ICD-10-CM

## 2014-02-01 DIAGNOSIS — Z1502 Genetic susceptibility to malignant neoplasm of ovary: Principal | ICD-10-CM

## 2014-02-01 DIAGNOSIS — Z1509 Genetic susceptibility to other malignant neoplasm: Principal | ICD-10-CM

## 2014-02-01 DIAGNOSIS — Z1379 Encounter for other screening for genetic and chromosomal anomalies: Secondary | ICD-10-CM | POA: Insufficient documentation

## 2014-02-01 NOTE — Telephone Encounter (Signed)
Called patient with positive test results.  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  Scheduled her to come in for results discussion on February 07, 2014 at 9 AM.

## 2014-02-07 ENCOUNTER — Encounter: Payer: Self-pay | Admitting: *Deleted

## 2014-02-07 ENCOUNTER — Encounter: Payer: Self-pay | Admitting: Genetic Counselor

## 2014-02-07 ENCOUNTER — Ambulatory Visit (HOSPITAL_BASED_OUTPATIENT_CLINIC_OR_DEPARTMENT_OTHER): Payer: Medicaid Other | Admitting: Genetic Counselor

## 2014-02-07 DIAGNOSIS — Z1589 Genetic susceptibility to other disease: Principal | ICD-10-CM

## 2014-02-07 DIAGNOSIS — Z1502 Genetic susceptibility to malignant neoplasm of ovary: Principal | ICD-10-CM

## 2014-02-07 DIAGNOSIS — Z803 Family history of malignant neoplasm of breast: Secondary | ICD-10-CM

## 2014-02-07 DIAGNOSIS — Z1509 Genetic susceptibility to other malignant neoplasm: Principal | ICD-10-CM

## 2014-02-07 DIAGNOSIS — C50312 Malignant neoplasm of lower-inner quadrant of left female breast: Secondary | ICD-10-CM

## 2014-02-07 DIAGNOSIS — Z315 Encounter for genetic counseling: Secondary | ICD-10-CM

## 2014-02-07 DIAGNOSIS — C50919 Malignant neoplasm of unspecified site of unspecified female breast: Secondary | ICD-10-CM

## 2014-02-07 NOTE — Progress Notes (Signed)
Called and spoke with Jearld Fenton at Dr. Marlowe Aschoff office to inform her of patient's genetic results.  Patient is ready to schedule surgery.  She will inform Dr. Barry Dienes and get her surgery scheduled.

## 2014-02-07 NOTE — Progress Notes (Addendum)
REFERRING PROVIDER: Almond Lint, MD  Serena Croissant, MD  PRIMARY PROVIDER:  No PCP Per Patient  PRIMARY REASON FOR VISIT:  1. PALB2-related breast cancer      HISTORY OF PRESENT ILLNESS:   Janice Crawford, a 38 y.o. female, was seen for a [facility] cancer genetics consultation for results of her genetic testing that was performed on December 29, 2013.  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  Genetic counseling was provided today about the PALB2 mutation.  UPDATE: SMARCA4 c.4629+5C>T VUS has been reclassified to Benign.  The amended report date is October 21, 2022.  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 [facility] [employer]  This test performed sequencing and/or deletion/duplication testing on the following genes: ATM, BARD1, BRCA1, BRCA2, BRIP1, CDH1, CHEK2, EPCAM, MLH1, MRE11A, MSH2, MSH6, MUTYH, NBN, NF1, PALB2, PMS2, PTEN, RAD50, RAD51C, RAD51D, SMARCA4, STK11, and TP53. This testing was performed based on a young diagnosis of breast cancer in the patient and her mother.     Past Medical History  Diagnosis Date   Hypertension    Cancer of lower-inner quadrant of female breast 12/20/2013   Diabetes mellitus without complication     Past Surgical History  Procedure Laterality Date   Caesarean     Tubal ligation      History   Social History    Marital Status: Single    Spouse Name: N/A    Number of Children: 3   Years of Education: N/A   Social History Main Topics   Smoking status: Former Smoker    Types: Cigars, Cigarettes    Quit date: 12/28/2004   Smokeless tobacco: None   Alcohol Use: Yes     Comment: occasional   Drug Use: No   Sexual Activity: Yes    Birth Control/ Protection: None   Other Topics Concern   None   Social History Narrative     FAMILY HISTORY:  We obtained a detailed, 4-generation family history.  Significant diagnoses are listed below: Family History  Problem Relation Age of Onset   Breast cancer Mother 105   Cancer Brother 28    NOS   Prostate cancer Father    The patient has one full brother who has a cancer NOS, and two maternal half brothers who are healthy.  She has approximately 20 paternal half siblings, none of whom have cancer.  She states that she thinks her father's siblings may have had cancer, but she is unclear of what type.    Patient's maternal ancestors are of African American descent, and paternal ancestors are of African American descent. There is no reported Ashkenazi Jewish ancestry. There is no known consanguinity.  GENETIC COUNSELING ASSESSMENT: Janice Crawford is a 38 y.o. female with a personal history of early onset breast cancer and a PALB2 mutation, which is a hereditary cancer syndrome and causes a predisposition to cancer. We,  therefore, discussed and recommended the following at today's visit.   DISCUSSION: We reviewed autosomal dominant inheritance, natural history of hereditary breast/pancreatic cancer syndrome, and current NCCN medical management guidelines for individuals with an increased risk for breast cancer.  The patient was provided with a packet of support resources for individuals with a hereditary cancer syndrome, including information about the [employer] and [employer] support organization.  We discussed that PALB2 is thought to be a moderate risk gene,  and to date most of the literature conveys a risk for breast cancer of about 25-40%. A recent paper came out that suggests that the risk for breast cancer may be an average of 35% lifetime risk and based on family history could be higher (Antoniou, NEJM [phone]).  We discussed that this is one paper, and would need to be replicated, but that it is also the largest to date. We reviewed the risk for other cancers in carriers for PALB2 mutations, including including female breast cancer, prostate, ovarian and pancreatic cancer, although these risks have not yet been quantified. NCCN guidelines suggest that individuals with PALB2 mutations have yearly MRI's. A discussion of the utility of a breast MRI, and clinical breast exams, vs. Risk reducing surgery also occurred. Lastly we discussed ways to decrease the risk for cancer, in general, including healthy living (not smoking, exercise, healthy weight).   Ms. Aiello children are at 50% risk to have inherited the mutation found in her. Her children, however, are relatively young and this will not be of any consequence to them for several years. We do not test children because there is no risk to them until they are adults. She has one daughter who is 23.  As we would start screening her by age 27/26, we would want to have her daughter undergo genetic testing by that time. It should also be kept in mind that for children, we are bound to know a great deal more about breast cancer and its prevention in several years' time. We recommend that her children have genetic counseling and testing by the time that they are in their early 32s.    Ms. Iseman siblings are at 50% risk to have inherited the mutation found in her. We recommend they have genetic testing for this same mutation, as identifying the presence of this mutation would allow them to also take advantage of risk-reducing measures. Because Ms. Shoulders is not very familiar with her father's side  of the family, but feels that there is possibly cancer on that side, both sides of her family should be notified of this genetic mutation so that the appropriate people can get tested.  The patient is very overwhelmed by her cancer diagnosis, the genetic test results, and her personal situation where she is essentially homeless with her children.  Tamala Julian, Social Worker, was available to talk with Ms. Mechanic about [employer] for housing.  PLAN: Ms. Vergel will be referred to the [facility] study for PALB2 carriers.  Someone from their facility will contact her.  Additionally, Marianne Sofia, Breast Navigator, will look into scheduling appointments for surgery.  She will contact Ms. Bjorkman once she has more information.    Lastly, we encouraged Ms. Bosquez to remain in contact with cancer genetics annually so that we can continuously update the family history and inform her of any changes in cancer genetics and testing that may be of benefit for this family.   Ms.  Kohlenberg questions were answered to her satisfaction today. Our  contact information was provided should additional questions or concerns arise. Thank you for the referral and allowing [place] to share in the care of your patient.   Siddhant Hashemi P. Lowell Guitar, MS, [facility] Certified Genetic Counselor [email or web address] phone: [phone]  The patient was seen for a total of 45 minutes in face-to-face genetic counseling.  This patient was discussed with Drs. Magrinat, Pamelia Hoit and/or Mosetta Putt who agrees with the above.    _______________________________________________________________________ For Office Staff:  Number of people involved in session: 1 Was an Intern/ student involved with case: no

## 2014-02-09 ENCOUNTER — Telehealth: Payer: Self-pay | Admitting: *Deleted

## 2014-02-09 NOTE — Telephone Encounter (Signed)
Spoke with patient today and she states she would like to see Dr. Barry Dienes to discuss surgery options since having her genetics results.  Left message with Jearld Fenton at Dr. Marlowe Aschoff office that patient needs an appointment to discuss surgery options.

## 2014-02-10 ENCOUNTER — Telehealth (INDEPENDENT_AMBULATORY_CARE_PROVIDER_SITE_OTHER): Payer: Self-pay | Admitting: General Surgery

## 2014-02-10 NOTE — Telephone Encounter (Signed)
Pt is very overwhelmed.  She was not able to come to clinic this AM.  She would like to see plastic surgery to help make her decision.

## 2014-02-24 DIAGNOSIS — C50919 Malignant neoplasm of unspecified site of unspecified female breast: Secondary | ICD-10-CM

## 2014-02-24 HISTORY — DX: Malignant neoplasm of unspecified site of unspecified female breast: C50.919

## 2014-03-02 ENCOUNTER — Telehealth: Payer: Self-pay | Admitting: *Deleted

## 2014-03-02 NOTE — Telephone Encounter (Signed)
I have received a message from [facility] at Dr. Marlowe Aschoff office stating patient cancelled her follow up due to wanting a second opinion. I called and spoke with patient and she states that she's not sure whether or not she really has cancer and she wants "to get a second opinion before someone starts cutting on her."  Offered to send her records but she is not sure what she wants to do.  Encouraged her to come in and speak with Dr. Lindi Adie to discuss further because she states she did not really hear anything that was discussed.  Confirmed appointment for 03/03/14 at 1245pm.

## 2014-03-03 ENCOUNTER — Ambulatory Visit (HOSPITAL_BASED_OUTPATIENT_CLINIC_OR_DEPARTMENT_OTHER): Payer: Medicaid Other | Admitting: Hematology and Oncology

## 2014-03-03 VITALS — BP 120/75 | HR 80 | Temp 98.3°F | Resp 19 | Ht 65.0 in | Wt 212.3 lb

## 2014-03-03 DIAGNOSIS — C50312 Malignant neoplasm of lower-inner quadrant of left female breast: Secondary | ICD-10-CM

## 2014-03-03 NOTE — Progress Notes (Signed)
Patient Care Team: No Pcp Per Patient as PCP - General (General Practice) [name], MD as Consulting Physician (General Surgery) Rulon Eisenmenger, MD as Consulting Physician (Hematology and Oncology) Rexene Edison, MD as Consulting Physician (Radiation Oncology)  DIAGNOSIS: Cancer of lower-inner quadrant of left female breast   Staging form: Breast, AJCC 7th Edition     Clinical stage from 12/28/2013: Stage IA (T1c, N0, M0) - Unsigned   SUMMARY OF ONCOLOGIC HISTORY:   Cancer of lower-inner quadrant of left female breast   12/06/2013 Breast MRI Right breast lower inner quadrant 1.3 cm area: Left breast middle depth 11 mm irregular mass no abnormal lymph nodes seen, multiple cystic lesions throughout the breast on both sides   12/16/2013 Initial Diagnosis 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 The patient does not want to undergo bilateral mastectomy and is here today to discuss what she would like to do.   REVIEW OF SYSTEMS:   Constitutional: Denies fevers, chills or abnormal weight loss Eyes: Denies blurriness of vision Ears, nose, mouth, throat, and face: Denies mucositis or sore throat Respiratory: Denies cough, dyspnea or wheezes Cardiovascular: Denies palpitation, chest discomfort or lower extremity swelling Gastrointestinal:  Denies nausea, heartburn or change in bowel habits Skin: Denies abnormal skin rashes Lymphatics: Denies new lymphadenopathy or easy bruising Neurological:Denies numbness, tingling or  new weaknesses Behavioral/Psych: Mood is stable, no new changes  All other systems were reviewed with the patient and are negative.  I have reviewed the past medical history, past surgical history, social history and family history with the patient and they are unchanged from previous note.  ALLERGIES:  is allergic to other.  MEDICATIONS:  Current Outpatient Prescriptions  Medication Sig Dispense Refill  . amoxicillin (AMOXIL) 500 MG capsule Take 1 capsule (500 mg total) by mouth 3 (three) times daily. 21 capsule 0  . ibuprofen (ADVIL,MOTRIN) 400 MG tablet Take 1 tablet (400 mg total) by mouth every 6 (six) hours as needed. 30 tablet 0  . metroNIDAZOLE (FLAGYL) 500 MG tablet Take 1 tablet (500 mg total) by mouth 2 (two) times daily. X 7 days 14 tablet 0  . naproxen sodium (ANAPROX) 220 MG tablet Take 400 mg by mouth every 6 (six) hours as needed (pain).     No current facility-administered medications for this visit.    PHYSICAL EXAMINATION: ECOG PERFORMANCE STATUS: 1 - Symptomatic but completely ambulatory  Filed Vitals:   03/03/14 1242  BP: 120/75  Pulse: 80  Temp: 98.3 F (36.8 C)  Resp: 19   Filed Weights   03/03/14 1242  Weight: 212 lb 4.8 oz (96.299 kg)    GENERAL:alert, no distress and comfortable SKIN: skin color, texture, turgor are normal, no rashes or significant lesions EYES: normal, Conjunctiva are pink and non-injected, sclera clear OROPHARYNX:no exudate, no erythema and lips, buccal mucosa, and tongue normal  NECK: supple, thyroid normal size, non-tender, without nodularity LYMPH:  no palpable lymphadenopathy in the cervical, axillary or inguinal LUNGS: clear to auscultation and percussion with normal breathing effort HEART: regular rate & rhythm and no murmurs  and no lower extremity edema ABDOMEN:abdomen soft, non-tender and normal bowel sounds Musculoskeletal:no cyanosis of digits and no clubbing  NEURO: alert & oriented x 3 with fluent speech, no focal  motor/sensory deficits  LABORATORY DATA:  I have reviewed the data as listed   Chemistry      Component Value Date/Time   NA 138 12/28/2013 0902   NA 140 07/06/2011 1455   K 4.0 12/28/2013 0902   K 3.7 07/06/2011 1455   CL 103 07/06/2011 1455   CO2 25 12/28/2013 0902   BUN 10.7 12/28/2013 0902   BUN 10 07/06/2011 1455   CREATININE 0.9 12/28/2013 0902   CREATININE 0.90 07/06/2011 1455      Component Value Date/Time   CALCIUM 9.2 12/28/2013 0902   ALKPHOS 39* 12/28/2013 0902   AST 15 12/28/2013 0902   ALT 12 12/28/2013 0902   BILITOT 0.47 12/28/2013 0902       Lab Results  Component Value Date   WBC 8.2 12/28/2013   HGB 12.2 12/28/2013   HCT 37.0 12/28/2013   MCV 91.3 12/28/2013   PLT 309 12/28/2013   NEUTROABS 4.3 12/28/2013   ASSESSMENT & PLAN:  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 The patient does not want to do bilateral mastectomies. She wants to do a lumpectomy with adjuvant therapy and do surveillance for recurrence of breast cancer.  PALB2 gene mutation counseling:The absolute breast-cancer risk for PALB2 female mutation carriers by 37 years of age ranged from 33% for those with no family history of breast cancer to 58% for those with two or more first-degree relatives with breast cancer at 37 years of age.  Since she does not want to do bilateral mastectomies, she will need annual surveillance MRIs and mammograms for follow-up.  I will see her back after surgery to discuss final pathology report and to discuss whether or not she will need adjuvant systemic chemotherapy. If she gets a lumpectomy she will need adjuvant radiation therapy followed by antiestrogen therapy. No orders  of the defined types were placed in this encounter.   The patient has a good understanding of the overall plan. she agrees with it. She will call with any problems that may develop before her next visit here.   Rulon Eisenmenger, MD 03/03/2014 1:24 PM

## 2014-03-03 NOTE — Assessment & Plan Note (Signed)
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 The patient does not want to do bilateral mastectomies. She wants to do a lumpectomy with adjuvant therapy and do surveillance for recurrence of breast cancer.  PALB2 gene mutation counseling:The absolute breast-cancer risk for PALB2 female mutation carriers by 37 years of age ranged from 33% for those with no family history of breast cancer to 58% for those with two or more first-degree relatives with breast cancer at 37 years of age.  Patient will need annual surveillance MRIs and mammograms for follow-up.  I will see her back after surgery to discuss final pathology report and to discuss whether or not she will need adjuvant systemic chemotherapy. If she gets a lumpectomy she will need adjuvant radiation therapy followed by antiestrogen therapy.

## 2014-03-27 DIAGNOSIS — R928 Other abnormal and inconclusive findings on diagnostic imaging of breast: Secondary | ICD-10-CM

## 2014-03-27 HISTORY — DX: Other abnormal and inconclusive findings on diagnostic imaging of breast: R92.8

## 2014-04-03 ENCOUNTER — Other Ambulatory Visit (INDEPENDENT_AMBULATORY_CARE_PROVIDER_SITE_OTHER): Payer: Self-pay | Admitting: General Surgery

## 2014-04-03 DIAGNOSIS — C50912 Malignant neoplasm of unspecified site of left female breast: Secondary | ICD-10-CM

## 2014-04-03 DIAGNOSIS — R928 Other abnormal and inconclusive findings on diagnostic imaging of breast: Secondary | ICD-10-CM

## 2014-04-10 ENCOUNTER — Encounter: Payer: Self-pay | Admitting: *Deleted

## 2014-04-10 ENCOUNTER — Telehealth: Payer: Self-pay | Admitting: Hematology and Oncology

## 2014-04-11 ENCOUNTER — Telehealth: Payer: Self-pay | Admitting: Hematology and Oncology

## 2014-04-11 ENCOUNTER — Other Ambulatory Visit: Payer: Self-pay

## 2014-04-11 NOTE — Telephone Encounter (Signed)
Lft msg for pt confirming MD visit per 02/16 POF, mailed schedule to pt.... KJ

## 2014-04-14 ENCOUNTER — Encounter (HOSPITAL_BASED_OUTPATIENT_CLINIC_OR_DEPARTMENT_OTHER): Payer: Self-pay | Admitting: *Deleted

## 2014-04-14 DIAGNOSIS — R0981 Nasal congestion: Secondary | ICD-10-CM

## 2014-04-14 HISTORY — DX: Nasal congestion: R09.81

## 2014-04-19 ENCOUNTER — Ambulatory Visit (HOSPITAL_BASED_OUTPATIENT_CLINIC_OR_DEPARTMENT_OTHER): Payer: Medicaid Other | Admitting: Anesthesiology

## 2014-04-19 ENCOUNTER — Encounter (HOSPITAL_BASED_OUTPATIENT_CLINIC_OR_DEPARTMENT_OTHER)
Admission: RE | Disposition: A | Discharge status: Home / Self Care | Payer: Self-pay | Source: Ambulatory Visit | Attending: General Surgery

## 2014-04-19 ENCOUNTER — Ambulatory Visit (HOSPITAL_BASED_OUTPATIENT_CLINIC_OR_DEPARTMENT_OTHER)
Admission: RE | Admit: 2014-04-19 | Discharge: 2014-04-19 | Disposition: A | Discharge status: Home / Self Care | Payer: Medicaid Other | Source: Ambulatory Visit | Attending: General Surgery | Admitting: General Surgery

## 2014-04-19 ENCOUNTER — Encounter (HOSPITAL_BASED_OUTPATIENT_CLINIC_OR_DEPARTMENT_OTHER): Payer: Self-pay | Admitting: Anesthesiology

## 2014-04-19 ENCOUNTER — Encounter (HOSPITAL_COMMUNITY)
Admission: RE | Admit: 2014-04-19 | Discharge: 2014-04-19 | Disposition: A | Discharge status: Home / Self Care | Payer: Medicaid Other | Source: Ambulatory Visit | Attending: General Surgery | Admitting: General Surgery

## 2014-04-19 DIAGNOSIS — M1990 Unspecified osteoarthritis, unspecified site: Secondary | ICD-10-CM | POA: Diagnosis not present

## 2014-04-19 DIAGNOSIS — N6092 Unspecified benign mammary dysplasia of left breast: Secondary | ICD-10-CM | POA: Insufficient documentation

## 2014-04-19 DIAGNOSIS — K219 Gastro-esophageal reflux disease without esophagitis: Secondary | ICD-10-CM | POA: Insufficient documentation

## 2014-04-19 DIAGNOSIS — Z87891 Personal history of nicotine dependence: Secondary | ICD-10-CM | POA: Insufficient documentation

## 2014-04-19 DIAGNOSIS — Z170 Estrogen receptor positive status [ER+]: Secondary | ICD-10-CM | POA: Insufficient documentation

## 2014-04-19 DIAGNOSIS — N6011 Diffuse cystic mastopathy of right breast: Secondary | ICD-10-CM | POA: Insufficient documentation

## 2014-04-19 DIAGNOSIS — C50912 Malignant neoplasm of unspecified site of left female breast: Secondary | ICD-10-CM | POA: Diagnosis present

## 2014-04-19 DIAGNOSIS — C50312 Malignant neoplasm of lower-inner quadrant of left female breast: Secondary | ICD-10-CM | POA: Diagnosis not present

## 2014-04-19 HISTORY — PX: BREAST BIOPSY: SHX20

## 2014-04-19 HISTORY — DX: Gastro-esophageal reflux disease without esophagitis: K21.9

## 2014-04-19 HISTORY — PX: BREAST LUMPECTOMY WITH NEEDLE LOCALIZATION AND AXILLARY SENTINEL LYMPH NODE BX: SHX5760

## 2014-04-19 HISTORY — DX: Personal history of other (healed) physical injury and trauma: Z87.828

## 2014-04-19 HISTORY — DX: Dermatitis, unspecified: L30.9

## 2014-04-19 HISTORY — DX: Unspecified osteoarthritis, unspecified site: M19.90

## 2014-04-19 HISTORY — DX: Prediabetes: R73.03

## 2014-04-19 HISTORY — DX: Other abnormal and inconclusive findings on diagnostic imaging of breast: R92.8

## 2014-04-19 HISTORY — DX: Tension-type headache, unspecified, not intractable: G44.209

## 2014-04-19 HISTORY — DX: Nasal congestion: R09.81

## 2014-04-19 SURGERY — BREAST LUMPECTOMY WITH NEEDLE LOCALIZATION AND AXILLARY SENTINEL LYMPH NODE BX
Anesthesia: General | Site: Breast | Laterality: Right

## 2014-04-19 MED ORDER — MIDAZOLAM HCL 2 MG/2ML IJ SOLN
1.0000 mg | INTRAMUSCULAR | Status: DC | PRN
  Administered 2014-04-19: 2 mg via INTRAVENOUS

## 2014-04-19 MED ORDER — OXYCODONE HCL 5 MG/5ML PO SOLN: 5.0000 mg | Freq: Once | ORAL | Status: DC | PRN

## 2014-04-19 MED ORDER — LACTATED RINGERS IV SOLN
INTRAVENOUS | Status: DC
  Administered 2014-04-19: 10:00:00 via INTRAVENOUS

## 2014-04-19 MED ORDER — OXYCODONE-ACETAMINOPHEN 5-325 MG PO TABS: 1.0000 | ORAL_TABLET | ORAL | Status: DC | PRN

## 2014-04-19 MED ORDER — MIDAZOLAM HCL 2 MG/2ML IJ SOLN
INTRAMUSCULAR | Status: AC
  Filled 2014-04-19: qty 2

## 2014-04-19 MED ORDER — HYDROMORPHONE HCL 1 MG/ML IJ SOLN: 0.2500 mg | INTRAMUSCULAR | Status: DC | PRN

## 2014-04-19 MED ORDER — MIDAZOLAM HCL 5 MG/5ML IJ SOLN
INTRAMUSCULAR | Status: DC | PRN
  Administered 2014-04-19: 2 mg via INTRAVENOUS

## 2014-04-19 MED ORDER — SODIUM CHLORIDE 0.9 % IJ SOLN
INTRAMUSCULAR | Status: AC
  Filled 2014-04-19: qty 10

## 2014-04-19 MED ORDER — ROPIVACAINE HCL 5 MG/ML IJ SOLN
INTRAMUSCULAR | Status: DC | PRN
  Administered 2014-04-19: 150 mg

## 2014-04-19 MED ORDER — ONDANSETRON HCL 4 MG/2ML IJ SOLN
INTRAMUSCULAR | Status: DC | PRN
  Administered 2014-04-19: 4 mg via INTRAVENOUS

## 2014-04-19 MED ORDER — METHYLENE BLUE 1 % INJ SOLN
INTRAMUSCULAR | Status: AC
  Filled 2014-04-19: qty 10

## 2014-04-19 MED ORDER — LIDOCAINE HCL (CARDIAC) 20 MG/ML IV SOLN
INTRAVENOUS | Status: DC | PRN
  Administered 2014-04-19: 50 mg via INTRAVENOUS

## 2014-04-19 MED ORDER — CEFAZOLIN SODIUM-DEXTROSE 2-3 GM-% IV SOLR
INTRAVENOUS | Status: AC
  Filled 2014-04-19: qty 50

## 2014-04-19 MED ORDER — OXYCODONE HCL 5 MG PO TABS: 5.0000 mg | ORAL_TABLET | Freq: Once | ORAL | Status: DC | PRN

## 2014-04-19 MED ORDER — PROMETHAZINE HCL 25 MG/ML IJ SOLN: 6.2500 mg | INTRAMUSCULAR | Status: DC | PRN

## 2014-04-19 MED ORDER — TECHNETIUM TC 99M SULFUR COLLOID FILTERED
1.0000 | Freq: Once | INTRAVENOUS | Status: AC | PRN
  Administered 2014-04-19: 1 via INTRADERMAL

## 2014-04-19 MED ORDER — MIDAZOLAM HCL 2 MG/ML PO SYRP: 12.0000 mg | ORAL_SOLUTION | Freq: Once | ORAL | Status: DC | PRN

## 2014-04-19 MED ORDER — PROPOFOL 10 MG/ML IV BOLUS
INTRAVENOUS | Status: DC | PRN
  Administered 2014-04-19: 200 mg via INTRAVENOUS

## 2014-04-19 MED ORDER — LIDOCAINE HCL 1 % IJ SOLN
INTRAMUSCULAR | Status: DC | PRN
  Administered 2014-04-19: 30 mL

## 2014-04-19 MED ORDER — FENTANYL CITRATE 0.05 MG/ML IJ SOLN
50.0000 ug | INTRAMUSCULAR | Status: DC | PRN
  Administered 2014-04-19: 100 ug via INTRAVENOUS

## 2014-04-19 MED ORDER — CEFAZOLIN SODIUM-DEXTROSE 2-3 GM-% IV SOLR
2.0000 g | INTRAVENOUS | Status: AC
  Administered 2014-04-19: 2 g via INTRAVENOUS

## 2014-04-19 MED ORDER — FENTANYL CITRATE 0.05 MG/ML IJ SOLN
INTRAMUSCULAR | Status: AC
  Filled 2014-04-19: qty 6

## 2014-04-19 MED ORDER — FENTANYL CITRATE 0.05 MG/ML IJ SOLN
INTRAMUSCULAR | Status: AC
  Filled 2014-04-19: qty 2

## 2014-04-19 MED ORDER — MIDAZOLAM HCL 2 MG/2ML IJ SOLN
INTRAMUSCULAR | Status: AC
Start: 2014-04-19 — End: 2014-04-19
  Filled 2014-04-19: qty 2

## 2014-04-19 MED ORDER — FENTANYL CITRATE 0.05 MG/ML IJ SOLN
INTRAMUSCULAR | Status: DC | PRN
  Administered 2014-04-19: 50 ug via INTRAVENOUS

## 2014-04-19 SURGICAL SUPPLY — 67 items
BINDER BREAST LRG (GAUZE/BANDAGES/DRESSINGS) IMPLANT
BINDER BREAST MEDIUM (GAUZE/BANDAGES/DRESSINGS) IMPLANT
BINDER BREAST XLRG (GAUZE/BANDAGES/DRESSINGS) ×1 IMPLANT
BINDER BREAST XXLRG (GAUZE/BANDAGES/DRESSINGS) IMPLANT
BLADE HEX COATED 2.75 (ELECTRODE) ×3 IMPLANT
BLADE SURG 10 STRL SS (BLADE) ×2 IMPLANT
BLADE SURG 15 STRL LF DISP TIS (BLADE) ×2 IMPLANT
BLADE SURG 15 STRL SS (BLADE) ×6
BNDG COHESIVE 4X5 TAN STRL (GAUZE/BANDAGES/DRESSINGS) ×3 IMPLANT
CANISTER SUCT 1200ML W/VALVE (MISCELLANEOUS) ×3 IMPLANT
CHLORAPREP W/TINT 26ML (MISCELLANEOUS) ×3 IMPLANT
CLIP TI LARGE 6 (CLIP) ×4 IMPLANT
CLIP TI MEDIUM 6 (CLIP) ×6 IMPLANT
CLIP TI WIDE RED SMALL 6 (CLIP) IMPLANT
COVER BACK TABLE 60X90IN (DRAPES) ×3 IMPLANT
COVER MAYO STAND STRL (DRAPES) ×3 IMPLANT
COVER PROBE W GEL 5X96 (DRAPES) ×3 IMPLANT
DECANTER SPIKE VIAL GLASS SM (MISCELLANEOUS) IMPLANT
DEVICE DUBIN W/COMP PLATE 8390 (MISCELLANEOUS) ×2 IMPLANT
DRAIN CHANNEL 19F RND (DRAIN) IMPLANT
DRAPE LAPAROTOMY 100X72 PEDS (DRAPES) ×2 IMPLANT
DRAPE UTILITY XL STRL (DRAPES) ×5 IMPLANT
DRSG PAD ABDOMINAL 8X10 ST (GAUZE/BANDAGES/DRESSINGS) ×2 IMPLANT
DRSG TEGADERM 4X4.75 (GAUZE/BANDAGES/DRESSINGS) IMPLANT
ELECT REM PT RETURN 9FT ADLT (ELECTROSURGICAL) ×3
ELECTRODE REM PT RTRN 9FT ADLT (ELECTROSURGICAL) ×2 IMPLANT
EVACUATOR SILICONE 100CC (DRAIN) IMPLANT
GAUZE SPONGE 4X4 12PLY STRL (GAUZE/BANDAGES/DRESSINGS) IMPLANT
GLOVE BIO SURGEON STRL SZ 6 (GLOVE) ×3 IMPLANT
GLOVE BIOGEL PI IND STRL 6.5 (GLOVE) ×2 IMPLANT
GLOVE BIOGEL PI INDICATOR 6.5 (GLOVE) ×1
GOWN STRL REUS W/ TWL LRG LVL3 (GOWN DISPOSABLE) ×2 IMPLANT
GOWN STRL REUS W/TWL 2XL LVL3 (GOWN DISPOSABLE) ×3 IMPLANT
GOWN STRL REUS W/TWL LRG LVL3 (GOWN DISPOSABLE) ×3
KIT MARKER MARGIN INK (KITS) ×3 IMPLANT
NDL HYPO 25X1 1.5 SAFETY (NEEDLE) ×4 IMPLANT
NDL SAFETY ECLIPSE 18X1.5 (NEEDLE) ×2 IMPLANT
NEEDLE HYPO 18GX1.5 SHARP (NEEDLE)
NEEDLE HYPO 25X1 1.5 SAFETY (NEEDLE) ×3 IMPLANT
NS IRRIG 1000ML POUR BTL (IV SOLUTION) ×3 IMPLANT
PACK BASIN DAY SURGERY FS (CUSTOM PROCEDURE TRAY) ×3 IMPLANT
PACK UNIVERSAL I (CUSTOM PROCEDURE TRAY) ×3 IMPLANT
PENCIL BUTTON HOLSTER BLD 10FT (ELECTRODE) ×3 IMPLANT
PIN SAFETY STERILE (MISCELLANEOUS) IMPLANT
SLEEVE SCD COMPRESS KNEE MED (MISCELLANEOUS) ×3 IMPLANT
SPONGE GAUZE 4X4 12PLY STER LF (GAUZE/BANDAGES/DRESSINGS) ×2 IMPLANT
SPONGE GAUZE 4X4 16PLY UNSTER (WOUND CARE) ×4 IMPLANT
SPONGE LAP 18X18 X RAY DECT (DISPOSABLE) ×3 IMPLANT
SPONGE LAP 4X18 X RAY DECT (DISPOSABLE) IMPLANT
STAPLER VISISTAT 35W (STAPLE) IMPLANT
STOCKINETTE IMPERVIOUS LG (DRAPES) ×3 IMPLANT
STRIP CLOSURE SKIN 1/2X4 (GAUZE/BANDAGES/DRESSINGS) ×3 IMPLANT
SUT MON AB 4-0 PC3 18 (SUTURE) ×4 IMPLANT
SUT SILK 2 0 SH (SUTURE) ×1 IMPLANT
SUT VIC AB 2-0 SH 27 (SUTURE)
SUT VIC AB 2-0 SH 27XBRD (SUTURE) IMPLANT
SUT VIC AB 3-0 54X BRD REEL (SUTURE) IMPLANT
SUT VIC AB 3-0 BRD 54 (SUTURE) ×3
SUT VIC AB 3-0 SH 27 (SUTURE) ×3
SUT VIC AB 3-0 SH 27X BRD (SUTURE) ×4 IMPLANT
SUT VICRYL 3-0 CR8 SH (SUTURE) ×2 IMPLANT
SYR BULB 3OZ (MISCELLANEOUS) ×2 IMPLANT
SYR CONTROL 10ML LL (SYRINGE) ×5 IMPLANT
TOWEL OR 17X24 6PK STRL BLUE (TOWEL DISPOSABLE) ×3 IMPLANT
TOWEL OR NON WOVEN STRL DISP B (DISPOSABLE) ×3 IMPLANT
TUBE CONNECTING 20X1/4 (TUBING) ×3 IMPLANT
YANKAUER SUCT BULB TIP NO VENT (SUCTIONS) ×3 IMPLANT

## 2014-04-19 NOTE — Op Note (Signed)
Right breast needle localized excisional biopsy and left needle localized Breast Lumpectomy with Sentinel Node Biopsy Procedure Note  Indications: This patient presents with history of a left breast cancer, and right abnormal mammogram with ductal papilloma on needle biopsy.    Pre-operative Diagnosis: left breast cancer, right abnormal mammogram  Post-operative Diagnosis: Same  Surgeon: [place]: none  Anesthesia: General LMA anesthesia, regional with pectoral block and Local anesthesia 1% plain lidocaine, 0.25.% bupivacaine, with epinephrine  ASA Class: 2  Procedure Details  The patient was seen in the Holding Room. The risks, benefits, complications, treatment options, and expected outcomes were discussed with the patient. The possibilities of reaction to medication, pulmonary aspiration, bleeding, infection, the need for additional procedures, failure to diagnose a condition, and creating a complication requiring transfusion or operation were discussed with the patient. The patient concurred with the proposed plan, giving informed consent. The site of surgery properly noted/marked. The patient was taken to Operating Room, identified as Janice Crawford, and the procedure verified as above. A Time Out was held and the above information confirmed.  After induction of anesthesia, both  breasts and chest, and left arm were prepped and draped in standard fashion. The right excisional biopsy was performed by creating an transverse incision over the lower inner quadrant of the breast around the previously placed wire. Orientation sutures were placed, long lateral and short superior, and hemostasis was achieved with cautery. Specimen radiograph confirmed presence of the clip in the specimen.   The wound was irrigated and closed with 3-0 Vicryl deep dermal interrupted and 4-0 monocryl subcuticular closure in layers.  Attention was then directed to the left. The lumpectomy was  performed through a transverse incision in the lower inner quadrant.  The cautery was used to dissect around the lumpectomy specimen.  The posterior margin is muscle.  The anterior margin is skin.   The edges of the cavity were marked with large clips, with one each medial, lateral, inferior and superior, and two clips posteriorly.   The specimen was inked with the margin marker paint kit. Hemostasis was achieved with cautery.  Specimen radiograph confirmed presence of the clip in the specimen.   The wound was irrigated and closed with 3-0 Vicryl deep dermal interrupted and 4-0 monocryl subcuticular closure in layers.   Using a hand-held gamma probe, two axillary sentinel nodes were identified transcutaneously.  An oblique incision was created below the axillary hairline.  Dissection was carried through the clavipectoral fascia.  Two level 2 axillary sentinel nodes were removed.  Counts per second were 1300 and 2500, respectively.    The background count was 20 cps.  The wound was irrigated.  Hemostasis was achieved with cautery.  The axillary incision was closed with a 3-0 vicryl deep dermal interrupted sutures and a 4-0 monocryl subcuticular closure.     Sterile dressings were applied. At the end of the operation, all sponge, instrument, and needle counts were correct.  Findings: grossly clear surgical margins  Estimated Blood Loss:  Minimal         Drains: none                Specimens: Right breast excisional biopsy, left breast lumpectomy, two left axillary lymph nodes.               Complications:  None; patient tolerated the procedure well.         Disposition: PACU - hemodynamically stable.  Condition: Stable

## 2014-04-19 NOTE — Anesthesia Postprocedure Evaluation (Signed)
Anesthesia Post Note  Patient: Janice Crawford  Procedure(s) Performed: Procedure(s) (LRB): BREAST LUMPECTOMY WITH NEEDLE LOCALIZATION AND AXILLARY SENTINEL LYMPH NODE BX (Left) RIGHT EXCIAIONAL BREAST BIOPSY WITH NEEDLE LOCALIZATION (Right)  Anesthesia type: General  Patient location: PACU  Post pain: Pain level controlled  Post assessment: Post-op Vital signs reviewed  Last Vitals: BP 124/78 mmHg  Pulse 64  Temp(Src) 36.6 C  Resp 14  Ht 5\' 4"  (1.626 m)  Wt 209 lb (94.802 kg)  BMI 35.86 kg/m2  SpO2 100%  LMP 04/18/2014  Post vital signs: Reviewed  Level of consciousness: sedated  Complications: No apparent anesthesia complications

## 2014-04-19 NOTE — H&P (Signed)
Janice Crawford. Mkrtchyan 04/03/2014 9:18 AM Location: [facility] Patient #: [id number] DOB: 04-22-1975 Undefined / Language: Janice Crawford / Race: Black or African American Female  History of Present Illness [name] MD; 04/04/2014 11:23 AM) Patient words: discuss surgery for breast.  The patient is a 39 year old female who presents with breast cancer. Previous history [The patient is being seen for a consultation for Stage I ( T1 and N0 ) invasive lobular carcinoma (and lobular carcinoma in situ. Ki67 20%) of the left breast (lower inner quadrant ). Tumor markers include estrogen receptor positive, progesterone receptor positive and HER-2/neu negative. The patient was referred by a specialty consultant (Dr. Christene Slates of radiology). Initial presentation was 1 week(s) ago for nipple discharge (bilateral clear to whitish). Current diagnosis was determined by mammography, breast MRI (1.7 cm lower innner quadrant), bilateral breast ultrasound (multiple masses, most cysts, vascular and duct ectasia) and core needle biopsy (bilateral biopsies performed by MR guidance. Right side was intraductal papilloma). Symptoms include breast pain (at biopsy sites) and nipple discharge, while symptoms do not include skin changes in the involved breast. The patient describes the pain as dull (occasional). Risk factors include menarche before 12 years and breast cancer in a first degree relative (mother at age 33). She is not currently pregnant.] Pt underwent genetics testing. She was found to have a PALB2 mutation. She reviewed the implications of this with her genetic counselor and Dr. Jana Hakim. She really does not desire a mastectomy at this time or a contralateral mastectomy. She can be followed with annual MRI.    Other Problems Flossie Buffy, RN; 04/03/2014 9:18 AM) Breast Cancer Gastroesophageal Reflux Disease  Past Surgical History Flossie Buffy, RN; 04/03/2014 9:18 AM) Resection of  Stomach  Diagnostic Studies History Flossie Buffy, RN; 04/03/2014 9:18 AM) Colonoscopy never Mammogram within last year Pap Smear 1-5 years ago  Allergies Flossie Buffy, RN; 04/03/2014 9:19 AM) No Known Drug Allergies02/09/2014  Medication History Flossie Buffy, RN; 04/03/2014 9:19 AM) No Current Medications  Social History Flossie Buffy, RN; 04/03/2014 9:18 AM) Alcohol use Occasional alcohol use. Caffeine use Carbonated beverages, Tea. No drug use Tobacco use Former smoker.  Family History Flossie Buffy, RN; 04/03/2014 9:18 AM) Alcohol Abuse Brother, Father, Sister. Arthritis Mother. Breast Cancer Mother. Colon Cancer Father. Depression Mother. Diabetes Mellitus Family Members In General. Heart Disease Family Members In General. Migraine Headache Brother, Mother. Seizure disorder Daughter.  Pregnancy / Birth History Flossie Buffy, RN; 04/03/2014 9:18 AM) Age at menarche 68 years. Gravida 5 Irregular periods Maternal age 18-20 Para 3  Review of Systems [name] RN; 04/03/2014 9:18 AM) General Present- Weight Gain. Not Present- Appetite Loss, Chills, Fatigue, Fever, Night Sweats and Weight Loss. Skin Not Present- Change in Wart/Mole, Dryness, Hives, Jaundice, New Lesions, Non-Healing Wounds, Rash and Ulcer. HEENT Present- Visual Disturbances. Not Present- Earache, Hearing Loss, Hoarseness, Nose Bleed, Oral Ulcers, Ringing in the Ears, Seasonal Allergies, Sinus Pain, Sore Throat, Wears glasses/contact lenses and Yellow Eyes. Respiratory Not Present- Bloody sputum, Chronic Cough, Difficulty Breathing, Snoring and Wheezing. Breast Present- Breast Pain. Not Present- Breast Mass, Nipple Discharge and Skin Changes. Cardiovascular Present- Swelling of Extremities. Not Present- Chest Pain, Difficulty Breathing Lying Down, Leg Cramps, Palpitations, Rapid Heart Rate and Shortness of Breath. Gastrointestinal Not Present- Abdominal Pain, Bloating,  Bloody Stool, Change in Bowel Habits, Chronic diarrhea, Constipation, Difficulty Swallowing, Excessive gas, Gets full quickly at meals, Hemorrhoids, Indigestion, Nausea, Rectal Pain and Vomiting. Female Genitourinary Not Present- Frequency, Nocturia, Painful Urination, Pelvic Pain  and Urgency. Musculoskeletal Not Present- Back Pain, Joint Pain, Joint Stiffness, Muscle Pain, Muscle Weakness and Swelling of Extremities. Neurological Not Present- Decreased Memory, Fainting, Headaches, Numbness, Seizures, Tingling, Tremor, Trouble walking and Weakness. Psychiatric Not Present- Anxiety, Bipolar, Change in Sleep Pattern, Depression, Fearful and Frequent crying. Endocrine Present- New Diabetes. Not Present- Cold Intolerance, Excessive Hunger, Hair Changes, Heat Intolerance and Hot flashes. Hematology Not Present- Easy Bruising, Excessive bleeding, Gland problems, HIV and Persistent Infections.   Vitals [name] RN; 04/03/2014 9:19 AM) 04/03/2014 9:19 AM Weight: 203.6 lb Height: 64in Body Surface Area: 2.04 m Body Mass Index: 34.95 kg/m Temp.: 99.56F(Oral)  Pulse: 60 (Regular)  Resp.: 14 (Unlabored)  BP: 112/78 (Sitting, Left Arm, Standard)    Physical Exam [name] MD; 04/04/2014 11:24 AM) General Mental Status-Alert. General Appearance-Consistent with stated age. Hydration-Well hydrated. Voice-Normal.  Head and Neck Head-normocephalic, atraumatic with no lesions or palpable masses.  Eye Sclera/Conjunctiva - Bilateral-No scleral icterus.  Chest and Lung Exam Chest and lung exam reveals -quiet, even and easy respiratory effort with no use of accessory muscles. Inspection Chest Wall - Normal. Back - normal.  Breast Note: Breasts are symmetric bilaterally. no palpable nodules. no LAD. No nipple retraction or discharge. no skin dimpling.   Cardiovascular Cardiovascular examination reveals -normal pedal pulses bilaterally. Note: regular rate and  rhythm  Abdomen Inspection-Inspection Normal. Palpation/Percussion Palpation and Percussion of the abdomen reveal - Soft, Non Tender, No Rebound tenderness, No Rigidity (guarding) and No hepatosplenomegaly. Auscultation Auscultation of the abdomen reveals - Bowel sounds normal.  Peripheral Vascular Upper Extremity Inspection - Bilateral - Normal - No Clubbing, No Cyanosis, No Edema, Pulses Intact. Lower Extremity Palpation - Edema - Bilateral - No edema.  Neurologic Neurologic evaluation reveals -alert and oriented x 3 with no impairment of recent or remote memory. Mental Status-Normal.  Musculoskeletal Global Assessment -Note: no gross deformities.  Normal Exam - Left-Upper Extremity Strength Normal and Lower Extremity Strength Normal. Normal Exam - Right-Upper Extremity Strength Normal and Lower Extremity Strength Normal.  Lymphatic Head & Neck  General Head & Neck Lymphatics: Bilateral - Description - Normal. Axillary  General Axillary Region: Bilateral - Description - Normal. Tenderness - Non Tender.    Assessment & Plan [name] MD; 04/04/2014 11:56 AM) PRIMARY CANCER OF LOWER-INNER QUADRANT OF LEFT FEMALE BREAST (174.3  C50.312) Impression: Pt will need a left seed localized lumpectomy with sentinel lymph node biopsy for her T1cN0 breast cancer. We discussed surgery in terms of anatomy. We discussed why we need another radiologic marker and how the SLN is performed. I gave her an anatomy drawing with her surgical plan on it.  The surgical procedure was described to the patient. I discussed the incision type and location and that we would need radiology involved on with a wire or seed marker and/or sentinel node.  The risks and benefits of the procedure were described to the patient and she wishes to proceed.  We discussed the risks bleeding, infection, damage to other structures, need for further procedures/surgeries. We discussed the risk of seroma.  The patient was advised that we may need to go back to surgery for additional tissue to obtain negative margins or for additional lymph nodes. The patient was advised that these are the most common complications, but that others can occur as well. They were advised against taking aspirin or other anti-inflammatory agents/blood thinners the week before surgery.  30 minutes were spent in the visit, wtih >50% spent in counseling. Current Plans  Pt  Education - CSS Breast Biopsy Instructions (FLB): discussed with patient and provided information. Schedule for Surgery ABNORMAL MAMMOGRAM OF RIGHT BREAST (793.80  R92.8) Impression: Because she had an intraductal papilloma, she will need right seed localized lumpectomy as well to make sure the papilloma is not associated with cancer.    Signed by [name], MD (04/04/2014 11:57 AM)

## 2014-04-19 NOTE — Anesthesia Procedure Notes (Addendum)
Anesthesia Regional Block:  Pectoralis block  Pre-Anesthetic Checklist: ,, timeout performed, Correct Patient, Correct Site, Correct Laterality, Correct Procedure, Correct Position, site marked, Risks and benefits discussed,  Surgical consent,  Pre-op evaluation,  At surgeon's request and post-op pain management  Laterality: Left  Prep: chloraprep       Needles:  Injection technique: Single-shot  Needle Type: Echogenic Stimulator Needle     Needle Length: 10cm 10 cm Needle Gauge: 21 and 21 G    Additional Needles:  Procedures: ultrasound guided (picture in chart) Pectoralis block Narrative:  Start time: 04/19/2014 10:23 AM End time: 04/19/2014 10:33 AM Injection made incrementally with aspirations every 5 mL.  Performed by: Personally    Procedure Name: LMA Insertion Date/Time: 04/19/2014 11:32 AM Performed by: Marrianne Mood Pre-anesthesia Checklist: Patient identified, Emergency Drugs available, Suction available and Patient being monitored Patient Re-evaluated:Patient Re-evaluated prior to inductionOxygen Delivery Method: Circle System Utilized Preoxygenation: Pre-oxygenation with 100% oxygen Intubation Type: IV induction Ventilation: Mask ventilation without difficulty LMA: LMA inserted LMA Size: 4.0 Number of attempts: 1 Airway Equipment and Method: Bite block Placement Confirmation: positive ETCO2 and breath sounds checked- equal and bilateral Tube secured with: Tape Dental Injury: Teeth and Oropharynx as per pre-operative assessment

## 2014-04-19 NOTE — Progress Notes (Signed)
Radiology staff performed nuc med injection. Pt required no additional sedation, VSS (see flowsheet), family not present at this time. Emotional support provided

## 2014-04-19 NOTE — Discharge Instructions (Addendum)
[facility] °Office Phone Number [phone] ° °BREAST BIOPSY/ PARTIAL MASTECTOMY: POST OP INSTRUCTIONS ° °Always review your discharge instruction sheet given to you by the facility where your surgery was performed. ° °IF YOU HAVE DISABILITY OR FAMILY LEAVE FORMS, YOU MUST BRING THEM TO THE OFFICE FOR PROCESSING.  DO NOT GIVE THEM TO YOUR DOCTOR. ° °1. A prescription for pain medication may be given to you upon discharge.  Take your pain medication as prescribed, if needed.  If narcotic pain medicine is not needed, then you may take acetaminophen (Tylenol) or ibuprofen (Advil) as needed. °2. Take your usually prescribed medications unless otherwise directed °3. If you need a refill on your pain medication, please contact your pharmacy.  They will contact our office to request authorization.  Prescriptions will not be filled after 5pm or on week-ends. °4. You should eat very light the first 24 hours after surgery, such as soup, crackers, pudding, etc.  Resume your normal diet the day after surgery. °5. Most patients will experience some swelling and bruising in the breast.  Ice packs and a good support bra will help.  Swelling and bruising can take several days to resolve.  °6. It is common to experience some constipation if taking pain medication after surgery.  Increasing fluid intake and taking a stool softener will usually help or prevent this problem from occurring.  A mild laxative (Milk of Magnesia or Miralax) should be taken according to package directions if there are no bowel movements after 48 hours. °7. Unless discharge instructions indicate otherwise, you may remove your bandages 48 hours after surgery, and you may shower at that time.  You may have steri-strips (small skin tapes) in place directly over the incision.  These strips should be left on the skin for 7-10 days.   Any sutures or staples will be removed at the office during your follow-up visit. °8. ACTIVITIES:  You may resume  regular daily activities (gradually increasing) beginning the next day.  Wearing a good support bra or sports bra (or the breast binder) minimizes pain and swelling.  You may have sexual intercourse when it is comfortable. °a. You may drive when you no longer are taking prescription pain medication, you can comfortably wear a seatbelt, and you can safely maneuver your car and apply brakes. °b. RETURN TO WORK:  __________1 week_______________ °9. You should see your doctor in the office for a follow-up appointment approximately two weeks after your surgery.  Your doctor’s nurse will typically make your follow-up appointment when she calls you with your pathology report.  Expect your pathology report 2-3 business days after your surgery.  You may call to check if you do not hear from us after three days. ° ° °WHEN TO CALL YOUR DOCTOR: °1. Fever over 101.0 °2. Nausea and/or vomiting. °3. Extreme swelling or bruising. °4. Continued bleeding from incision. °5. Increased pain, redness, or drainage from the incision. ° °The clinic staff is available to answer your questions during regular business hours.  Please don’t hesitate to call and ask to speak to one of the nurses for clinical concerns.  If you have a medical emergency, go to the nearest emergency room or call 911.  A surgeon from [facility] is always on call at the hospital. ° °For further questions, please visit [email or web address]  ° ° °Post Anesthesia Home Care Instructions ° °Activity: °Get plenty of rest for the remainder of the day. A responsible adult should stay with you for 24   hours following the procedure.  °For the next 24 hours, DO NOT: °-Drive a car °-Operate machinery °-Drink alcoholic beverages °-Take any medication unless instructed by your physician °-Make any legal decisions or sign important papers. ° °Meals: °Start with liquid foods such as gelatin or soup. Progress to regular foods as tolerated. Avoid greasy, spicy, heavy  foods. If nausea and/or vomiting occur, drink only clear liquids until the nausea and/or vomiting subsides. Call your physician if vomiting continues. ° °Special Instructions/Symptoms: °Your throat may feel dry or sore from the anesthesia or the breathing tube placed in your throat during surgery. If this causes discomfort, gargle with warm salt water. The discomfort should disappear within 24 hours. ° ° °

## 2014-04-19 NOTE — Anesthesia Preprocedure Evaluation (Addendum)
Anesthesia Evaluation  Patient identified by MRN, date of birth, ID band Patient awake    Reviewed: Allergy & Precautions, H&P , NPO status , Patient's Chart, lab work & pertinent test results  History of Anesthesia Complications Negative for: history of anesthetic complications  Airway Mallampati: II  TM Distance: >3 FB Neck ROM: full    Dental  (+) Teeth Intact, Dental Advidsory Given   Pulmonary former smoker,  breath sounds clear to auscultation        Cardiovascular negative cardio ROS  Rhythm:regular Rate:Normal     Neuro/Psych  Headaches, negative psych ROS   GI/Hepatic Neg liver ROS, GERD-  ,  Endo/Other  negative endocrine ROS  Renal/GU negative Renal ROS     Musculoskeletal  (+) Arthritis -,   Abdominal   Peds  Hematology   Anesthesia Other Findings   Reproductive/Obstetrics negative OB ROS                           Anesthesia Physical Anesthesia Plan  ASA: II  Anesthesia Plan: General LMA   Post-op Pain Management: MAC Combined w/ Regional for Post-op pain   Induction: Intravenous  Airway Management Planned: LMA  Additional Equipment:   Intra-op Plan:   Post-operative Plan: Extubation in OR  Informed Consent: I have reviewed the patients History and Physical, chart, labs and discussed the procedure including the risks, benefits and alternatives for the proposed anesthesia with the patient or authorized representative who has indicated his/her understanding and acceptance.   Dental Advisory Given  Plan Discussed with: Anesthesiologist, CRNA and Surgeon  Anesthesia Plan Comments:        Anesthesia Quick Evaluation

## 2014-04-19 NOTE — Interval H&P Note (Signed)
The patient was originally posted for seeds, but did not show up for her appointment.  Given her history of no-shows, wires were placed instead for localization.

## 2014-04-19 NOTE — Progress Notes (Signed)
Assisted Dr. Singer with left, ultrasound guided, pectoralis block. Side rails up, monitors on throughout procedure. See vital signs in flow sheet. Tolerated Procedure well. °

## 2014-04-19 NOTE — Interval H&P Note (Signed)
History and Physical Interval Note:  04/19/2014 10:51 AM  Janice Crawford  has presented today for surgery, with the diagnosis of LEFT BREAST CANCER, RIGHT ABNORMAL MAMMOGRAM  The various methods of treatment have been discussed with the patient and family. After consideration of risks, benefits and other options for treatment, the patient has consented to  Procedure(s): BREAST LUMPECTOMY WITH NEEDLE LOCALIZATION AND AXILLARY SENTINEL LYMPH NODE BX (Left) RIGHT EXCIAIONAL BREAST BIOPSY WITH NEEDLE LOCALIZATION (Right) as a surgical intervention .  The patient's history has been reviewed, patient examined, no change in status, stable for surgery.  I have reviewed the patient's chart and labs.  Questions were answered to the patient's satisfaction.     [name]

## 2014-04-19 NOTE — Transfer of Care (Signed)
Immediate Anesthesia Transfer of Care Note  Patient: Janice Crawford  Procedure(s) Performed: Procedure(s): BREAST LUMPECTOMY WITH NEEDLE LOCALIZATION AND AXILLARY SENTINEL LYMPH NODE BX (Left) RIGHT EXCIAIONAL BREAST BIOPSY WITH NEEDLE LOCALIZATION (Right)  Patient Location: PACU  Anesthesia Type:GA combined with regional for post-op pain  Level of Consciousness: awake and patient cooperative  Airway & Oxygen Therapy: Patient Spontanous Breathing and Patient connected to face mask oxygen  Post-op Assessment: Report given to RN and Post -op Vital signs reviewed and stable  Post vital signs: Reviewed and stable  Last Vitals:  Filed Vitals:   04/19/14 1115  BP: 114/75  Pulse: 67  Temp:   Resp: 15    Complications: No apparent anesthesia complications

## 2014-04-20 ENCOUNTER — Encounter (HOSPITAL_BASED_OUTPATIENT_CLINIC_OR_DEPARTMENT_OTHER): Payer: Self-pay | Admitting: General Surgery

## 2014-04-24 LAB — POCT HEMOGLOBIN-HEMACUE: Hemoglobin: 12 g/dL (ref 12.0–15.0)

## 2014-04-27 NOTE — Progress Notes (Signed)
Quick Note:  Please let patient know that we do not have to go back for reexcision. So margins, LN negative. ______

## 2014-04-28 ENCOUNTER — Telehealth: Payer: Self-pay | Admitting: *Deleted

## 2014-04-28 ENCOUNTER — Ambulatory Visit: Payer: Medicaid Other | Admitting: Hematology and Oncology

## 2014-04-28 NOTE — Telephone Encounter (Signed)
Left message for a return phone call concerning patient's missed appointment.  Awaiting patient response.

## 2014-05-01 ENCOUNTER — Encounter: Payer: Self-pay | Admitting: *Deleted

## 2014-05-01 NOTE — Progress Notes (Signed)
Ordered oncotype per Dr. Lindi Adie order.  Faxed requisition to pathology and confirmed with [facility].

## 2014-05-02 ENCOUNTER — Telehealth: Payer: Self-pay | Admitting: *Deleted

## 2014-05-02 NOTE — Telephone Encounter (Signed)
Spoke with patient today and confirmed follow up appointment with Dr. Lindi Adie for 05/17/14 at 945am.  She states she was unaware of her appointment with Dr. Lindi Adie on 3/4 and she did not get a reminder call.

## 2014-05-03 ENCOUNTER — Ambulatory Visit: Payer: Medicaid Other | Admitting: Hematology and Oncology

## 2014-05-09 ENCOUNTER — Encounter (HOSPITAL_COMMUNITY): Payer: Self-pay

## 2014-05-09 ENCOUNTER — Encounter: Payer: Self-pay | Admitting: *Deleted

## 2014-05-09 NOTE — Progress Notes (Signed)
Received oncotype score of 23/15%.  Copy to medical records to scan and copy to Dr. Lindi Adie.

## 2014-05-16 ENCOUNTER — Encounter: Payer: Self-pay | Admitting: Radiation Oncology

## 2014-05-16 NOTE — Progress Notes (Signed)
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 Breast, lumpectomy, Right - FIBROCYSTIC CHANGES. - HEALING BIOPSY SITE. - THERE IS NO EVIDENCE OF MALIGNANCY. - SEE COMMENT. 2. Breast, lumpectomy, Left - INVASIVE DUCTAL CARCINOMA, GRADE II/III, SPANNING 1.5 CM. - DUCTAL CARCINOMA IN SITU, INTERMEDIATE GRADE. - INVASIVE CARCINOMA IS BROADLY LESS THAN 0.1 CM TO THE POSTERIOR MARGIN OF SPECIMEN #2. - DUCTAL CARCINOMA IN SITU IS FOCALLY 0.1 CM TO THE SUPERIOR MARGIN OF SPECIMEN #2. - SEE ONCOLOGY TABLE BELOW. 1 of 4 Duplicate copy FINAL for MAYLEIGH, TETRAULT ([id number]) Diagnosis(continued) 3. Breast, excision, new inferior margin, left - ATYPICAL DUCTAL HYPERPLASIA. - SEE COMMENT. 4. Lymph node, sentinel, biopsy, left axillary #1 - THERE IS NO EVIDENCE OF CARCINOMA IN 1 OF 1 LYMPH NODE (0/1). - SEE COMMENT. 5. Lymph node, sentinel, biopsy, left axillary #2 - THERE IS NO EVIDENCE OF CARCINOMA IN 1 OF 1 LYMPH NODE (0/1). - SEE COMMENT. Microscopic Comment 1. Following histologic review, I do not recognize a papilloma in this current specimen. The surgical resection margin(s) of the specimen were inked and microscopically evaluated.  12/16/13 Diagnosis Breast, left, needle core biopsy - INVASIVE MAMMARY CARCINOMA. - MAMMARY CARCINOMA IN SITU. - SEE COMMENT.  Receptor Status: ER(90%), PR (90%), Her2-neu (-), Ki-67 (20%)  Ms. Serafina Mitchell.Dungee had nipple discharge for the past couple of years. She has felt abnormal lumps or nodules in the breast for a while. She underwent mammogram which revealed abnormalities that  led to a biopsy on 12/16/2013. She had a breast MRI to 13,015 that revealed 1.3 cm area in the right breast and 1.1 cm area in the left breast abnormalities. She underwent MRI guided biopsy of both of these. 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  Past/Anticipated interventions by surgeon, if any: Dr. [name]: Lumpectomy Left Breast  Past/Anticipated interventions by medical oncology, if any: Chemotherapy ???  Lymphedema issues, if any:     Pain issues, if any:   SAFETY ISSUES:  Prior radiation? No  Pacemaker/ICD? No  Possible current pregnancy? No  Is the patient on methotrexate? No  Current Complaints / other details:   She menarched at early age of 9  She had 3 pregnancy, her first child was born at age 60  She has not received birth control pills.  She was never exposed to fertility medications or hormone replacement therapy    Jonta Gastineau, Crista Curb, RN 05/16/2014,6:31 PM

## 2014-05-17 ENCOUNTER — Ambulatory Visit
Admission: RE | Admit: 2014-05-17 | Discharge: 2014-05-17 | Disposition: A | Discharge status: Home / Self Care | Payer: Medicaid Other | Source: Ambulatory Visit | Attending: Radiation Oncology | Admitting: Radiation Oncology

## 2014-05-17 ENCOUNTER — Telehealth: Payer: Self-pay | Admitting: Hematology and Oncology

## 2014-05-17 ENCOUNTER — Ambulatory Visit (HOSPITAL_BASED_OUTPATIENT_CLINIC_OR_DEPARTMENT_OTHER): Payer: Medicaid Other | Admitting: Hematology and Oncology

## 2014-05-17 VITALS — BP 130/78 | HR 98 | Temp 97.6°F | Resp 18 | Ht 64.0 in | Wt 205.5 lb

## 2014-05-17 DIAGNOSIS — D0592 Unspecified type of carcinoma in situ of left breast: Secondary | ICD-10-CM | POA: Insufficient documentation

## 2014-05-17 DIAGNOSIS — C50312 Malignant neoplasm of lower-inner quadrant of left female breast: Secondary | ICD-10-CM

## 2014-05-17 DIAGNOSIS — Z853 Personal history of malignant neoplasm of breast: Secondary | ICD-10-CM | POA: Diagnosis not present

## 2014-05-17 DIAGNOSIS — L598 Other specified disorders of the skin and subcutaneous tissue related to radiation: Secondary | ICD-10-CM | POA: Diagnosis not present

## 2014-05-17 DIAGNOSIS — Z9012 Acquired absence of left breast and nipple: Secondary | ICD-10-CM | POA: Insufficient documentation

## 2014-05-17 DIAGNOSIS — Z510 Encounter for antineoplastic radiation therapy: Secondary | ICD-10-CM | POA: Diagnosis not present

## 2014-05-17 DIAGNOSIS — Z170 Estrogen receptor positive status [ER+]: Secondary | ICD-10-CM | POA: Diagnosis not present

## 2014-05-17 MED ORDER — TAMOXIFEN CITRATE 20 MG PO TABS
20.0000 mg | ORAL_TABLET | Freq: Every day | ORAL | Status: DC
Start: 2014-05-17 — End: 2014-08-01

## 2014-05-17 NOTE — Progress Notes (Signed)
CC: Dr. [name], Dr. 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  I first saw the patient at the multidisciplinary breast clinic on 12/28/2013.  She presented with bilateral nipple discharge and bilateral breast lumps. Multiple masses were seen in both breasts. Ultrasound on 11/22/2013 showed multiple cysts and breast MR was recommended for further evaluation. Breast MR on 12/05/2013 showed a 1.3 cm area of enhancement within the lower inner quadrant of the right breast and within the left breast a 1.1 x 0.5 x 0.8 cm irregular enhancing mass along the lower inner aspect. MRI guided biopsies on 12/16/2013 revealed Invasive lobular carcinoma along with LCIS from the left breast and intraductal papilloma from the right breast. Her invasive lobular carcinoma was ER positive and PR positive at 90% each with a proliferation index of 20%.  She underwent genetic testing and was found to have a PALB 2 mutation, and bilateral mastectomies were advised.  She had a delay in scheduling her surgery because of changing her cell phone number.  On 04/19/2014 she underwent a left partial mastectomy and sentinel lymph node biopsy.  She also had a right breast lumpectomy showing fibrocystic changes.  On the left she was found to have a 1.5 cm grade 2 invasive ductal carcinoma along with intermediate grade DCIS.  Invasive carcinoma was probably less than 0.1 cm to the posterior margin and DCIS was focally 0.1 cm to the superior margin.  2 sentinel lymph nodes were free of metastatic disease.  Her tumor was ER positive at 90%, PR positive at 90% and HER-2/neu negative.  Ki-67 was 20%.  She underwent Oncotype DX testing in her Oncotype DX score was 23 with a 15% risk  of recurrence.  She plans on having adjuvant tamoxifen but no chemotherapy.  She is without complaints today.  Physical examination: Alert and oriented. Wt Readings from Last 3 Encounters:  05/17/14 205 lb 8 oz (93.214 kg)  04/19/14 209 lb (94.802 kg)  03/03/14 212 lb 4.8 oz (96.299 kg)   Temp Readings from Last 3 Encounters:  05/17/14 97.6 F (36.4 C) Oral  04/19/14 98.4 F (36.9 C)   03/03/14 98.3 F (36.8 C) Oral   BP Readings from Last 3 Encounters:  05/17/14 130/78  04/19/14 139/88  03/03/14 120/75   Pulse Readings from Last 3 Encounters:  05/17/14 98  04/19/14 67  03/03/14 80   Head and neck examination: She wears a wig.  Nodes: Without palpable cervical, supraclavicular, or axillary lymphadenopathy.  Chest: Lungs clear.  Breasts: There are well-healed partial mastectomy wound along the inferior aspect of the right breast at 6:00, and lower inner quadrant of left breast at 7:00.  No masses are appreciated.  Extremities: Without edema.  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  Despite her mutation, she refuses bilateral mastectomies.  Her surgical margins are adequate.  I do recommend standard fractionation with a boost to her left breast tumor bed.  We discussed the potential acute and late toxicities of radiation therapy.  She may benefit from deep inspiration breath-hold technology to avoid cardiac irradiation.  Consent is signed today.  We'll have her return for CT simulation as early as tomorrow.  Plan: As  discussed above.  30 minutes was spent face-to-face with the patient, primarily counseling patient and coordinating her care.

## 2014-05-17 NOTE — Assessment & Plan Note (Signed)
Left lumpectomy 04/19/2014: IDC grade 2, 1.5 cm, intermediate grade DCIS, 0/2 lymph nodes, ER 90%, PR 90%, HER-2 negative ratio 1.33, Ki-67 20%,  Oncotype DX recurrence score 23, 15% ROR Right lumpectomy: Fibrocystic changes no malignancy.  Oncotype DX counseling: I discussed the results of Oncotype DX score which puts her at intermediate risk of recurrence. Patient understands that there is no clear guidelines on management of intermediate risk. Her risk of recurrence based on Oncotype DX score is at 15% if she took 5 years of tamoxifen therapy. Based on this score, I did not recommend systemic chemotherapy also based on her personal preference and wishes.  Recommendation: 1. Adjuvant radiation to the left breast 2. Followed by antiestrogen therapy with tamoxifen for 10 years  Tamoxifen counseling:We discussed the risks and benefits of tamoxifen. These include but not limited to insomnia, hot flashes, mood changes, vaginal dryness, and weight gain. Although rare, serious side effects including endometrial cancer, risk of blood clots were also discussed. We strongly believe that the benefits far outweigh the risks. Patient understands these risks and consented to starting treatment. Planned treatment duration is 10 years. Patient also understands that she cannot get pregnant on tamoxifen and is to take adequate precautions.

## 2014-05-17 NOTE — Progress Notes (Signed)
Patient Care Team: Abby Potash, PA-C as PCP - General (Physician Assistant) [name], MD as Consulting Physician (General Surgery) Nicholas Lose, MD as Consulting Physician (Hematology and Oncology) Arloa Koh, MD as Consulting Physician (Radiation Oncology)  DIAGNOSIS: Cancer of lower-inner quadrant of left female breast   Staging form: Breast, AJCC 7th Edition     Clinical stage from 12/28/2013: Stage IA (T1c, N0, M0) - Unsigned     Pathologic stage from 04/24/2014: Stage IA (T1c, N0, cM0) - Signed by Enid Cutter, MD on 05/04/2014       Staging comments: Staged on final lumpectomy specimen by Dr. Lyndon Code    SUMMARY OF ONCOLOGIC HISTORY:   Cancer of lower-inner quadrant of left female breast   12/06/2013 Breast MRI Right breast lower inner quadrant 1.3 cm area: Left breast middle depth 11 mm irregular mass no abnormal lymph nodes seen, multiple cystic lesions throughout the breast on both sides   12/16/2013 Initial Diagnosis Invasive mammary carcinoma with mammary carcinoma in situ it has lobular features grade 2, ER 90%, PR 90%, Ki-67 40%, HER-2 negative ratio 1.33   04/19/2014 Surgery Left lumpectomy: IDC grade 2, 1.5 cm, intermediate grade DCIS, 0/2 lymph nodes, ER 90%, PR 90%, HER-2 negative ratio 1.33, Ki-67 20%,  Oncotype DX recurrence score 23, 15% ROR    CHIEF COMPLIANT: Follow-up after lumpectomy and Oncotype DX testing  INTERVAL HISTORY: Janice Crawford is a 39 year old left pneumonectomy with above-mentioned history of PALB 2 mutation who underwent lumpectomy on left breast which revealed invasive ductal carcinoma grade 2 stage IA with ER/PR positive HER-2 negative and Oncotype DX recurrence score of intermediate range. We had a hard time getting hold of her and finally she was able to come for an appointment today.  REVIEW OF SYSTEMS:   Constitutional: Denies fevers, chills or abnormal weight loss Eyes: Denies blurriness of vision Ears, nose, mouth, throat, and face:  Denies mucositis or sore throat Respiratory: Denies cough, dyspnea or wheezes Cardiovascular: Denies palpitation, chest discomfort or lower extremity swelling Gastrointestinal:  Denies nausea, heartburn or change in bowel habits Skin: Denies abnormal skin rashes Lymphatics: Denies new lymphadenopathy or easy bruising Neurological:Denies numbness, tingling or new weaknesses Behavioral/Psych: Mood is stable, no new changes  Breast:  denies any pain or lumps or nodules in either breasts All other systems were reviewed with the patient and are negative.  I have reviewed the past medical history, past surgical history, social history and family history with the patient and they are unchanged from previous note.  ALLERGIES:  is allergic to latex.  MEDICATIONS:  Current Outpatient Prescriptions  Medication Sig Dispense Refill  . Multiple Vitamin (MULTIVITAMIN) tablet Take 1 tablet by mouth daily.    Marland Kitchen oxyCODONE-acetaminophen (ROXICET) 5-325 MG per tablet Take 1-2 tablets by mouth every 4 (four) hours as needed for severe pain. 30 tablet 0  . vitamin B-12 (CYANOCOBALAMIN) 100 MCG tablet Take 100 mcg by mouth daily.    . tamoxifen (NOLVADEX) 20 MG tablet Take 1 tablet (20 mg total) by mouth daily. Start after radiation is complete 90 tablet 3   No current facility-administered medications for this visit.    PHYSICAL EXAMINATION: ECOG PERFORMANCE STATUS: 0 - Asymptomatic  Filed Vitals:   05/17/14 1004  BP: 130/78  Pulse: 98  Temp: 97.6 F (36.4 C)  Resp: 18   Filed Weights   05/17/14 1004  Weight: 205 lb 8 oz (93.214 kg)    GENERAL:alert, no distress and comfortable SKIN: skin color, texture, turgor  are normal, no rashes or significant lesions EYES: normal, Conjunctiva are pink and non-injected, sclera clear OROPHARYNX:no exudate, no erythema and lips, buccal mucosa, and tongue normal  NECK: supple, thyroid normal size, non-tender, without nodularity LYMPH:  no palpable  lymphadenopathy in the cervical, axillary or inguinal LUNGS: clear to auscultation and percussion with normal breathing effort HEART: regular rate & rhythm and no murmurs and no lower extremity edema ABDOMEN:abdomen soft, non-tender and normal bowel sounds Musculoskeletal:no cyanosis of digits and no clubbing  NEURO: alert & oriented x 3 with fluent speech, no focal motor/sensory deficits   LABORATORY DATA:  I have reviewed the data as listed   Chemistry      Component Value Date/Time   NA 138 12/28/2013 0902   NA 140 07/06/2011 1455   K 4.0 12/28/2013 0902   K 3.7 07/06/2011 1455   CL 103 07/06/2011 1455   CO2 25 12/28/2013 0902   BUN 10.7 12/28/2013 0902   BUN 10 07/06/2011 1455   CREATININE 0.9 12/28/2013 0902   CREATININE 0.90 07/06/2011 1455      Component Value Date/Time   CALCIUM 9.2 12/28/2013 0902   ALKPHOS 39* 12/28/2013 0902   AST 15 12/28/2013 0902   ALT 12 12/28/2013 0902   BILITOT 0.47 12/28/2013 0902       Lab Results  Component Value Date   WBC 8.2 12/28/2013   HGB 12.0 04/19/2014   HCT 37.0 12/28/2013   MCV 91.3 12/28/2013   PLT 309 12/28/2013   NEUTROABS 4.3 12/28/2013   ASSESSMENT & PLAN:  Cancer of lower-inner quadrant of left female breast Left lumpectomy 04/19/2014: IDC grade 2, 1.5 cm, intermediate grade DCIS, 0/2 lymph nodes, ER 90%, PR 90%, HER-2 negative ratio 1.33, Ki-67 20%,  Oncotype DX recurrence score 23, 15% ROR Right lumpectomy: Fibrocystic changes no malignancy.  Oncotype DX counseling: I discussed the results of Oncotype DX score which puts her at intermediate risk of recurrence. Patient understands that there is no clear guidelines on management of intermediate risk. Her risk of recurrence based on Oncotype DX score is at 15% if she took 5 years of tamoxifen therapy. Based on this score, I did not recommend systemic chemotherapy also based on her personal preference and wishes.  Recommendation: 1. Adjuvant radiation to the left  breast 2. Followed by antiestrogen therapy with tamoxifen for 10 years  Tamoxifen counseling:We discussed the risks and benefits of tamoxifen. These include but not limited to insomnia, hot flashes, mood changes, vaginal dryness, and weight gain. Although rare, serious side effects including endometrial cancer, risk of blood clots were also discussed. We strongly believe that the benefits far outweigh the risks. Patient understands these risks and consented to starting treatment. Planned treatment duration is 10 years. Patient also understands that she cannot get pregnant on tamoxifen and is to take adequate precautions.  We will consult Dr. Valere Dross with radiation oncology. Patient keeps changing her cell phone numbers and may be hard to get a hold of sometimes. Return to clinic in 3 months for follow-up.   No orders of the defined types were placed in this encounter.   The patient has a good understanding of the overall plan. she agrees with it. She will call with any problems that may develop before her next visit here.   Rulon Eisenmenger, MD   Seen her she states

## 2014-05-17 NOTE — Progress Notes (Signed)
Please see the Nurse Progress Note in the MD Initial Consult Encounter for this patient. 

## 2014-05-17 NOTE — Telephone Encounter (Signed)
per pof to sch pt appt-gave pt copy of sch °

## 2014-05-19 ENCOUNTER — Encounter: Payer: Self-pay | Admitting: *Deleted

## 2014-05-19 NOTE — Progress Notes (Signed)
[place] Psychosocial Distress Screening Clinical Social Work  Clinical Social Work was referred by distress screening protocol.  The patient scored a 5 on the Psychosocial Distress Thermometer which indicates moderate distress. Clinical Social Worker phoned pt to assess for distress and other psychosocial needs. CSW had to leave pt a message. CSW team met with pt last fall and will try to reconnect with her at future appointments.   ONCBCN DISTRESS SCREENING 05/17/2014  Screening Type Initial Screening  Distress experienced in past week (1-10) 5  Practical problem type   Family Problem type Other (comment)  Spiritual/Religous concerns type Relating to God  Physician notified of physical symptoms Yes  Referral to clinical social work Yes  Referral to financial advocate   Referral to support programs     Clinical Social Worker follow up needed: Yes.    If yes, follow up plan: See above Loren Racer, [place] Worker [place]  [facility] Phone: [phone] Fax: [phone]

## 2014-05-29 ENCOUNTER — Encounter: Payer: Self-pay | Admitting: Hematology and Oncology

## 2014-05-29 ENCOUNTER — Ambulatory Visit
Admission: RE | Admit: 2014-05-29 | Discharge: 2014-05-29 | Disposition: A | Discharge status: Home / Self Care | Payer: Medicaid Other | Source: Ambulatory Visit | Attending: Radiation Oncology | Admitting: Radiation Oncology

## 2014-05-29 DIAGNOSIS — Z510 Encounter for antineoplastic radiation therapy: Secondary | ICD-10-CM | POA: Diagnosis not present

## 2014-05-29 DIAGNOSIS — C50312 Malignant neoplasm of lower-inner quadrant of left female breast: Secondary | ICD-10-CM

## 2014-05-29 NOTE — Progress Notes (Signed)
Complex simulation/treatment planning note: The patient was taken to the CT simulator.  A custom Vac lock immobilization device was constructed on a custom breast board.  She was placed supine.  Her left breast field borders were marked with radiopaque wires along with her partial mastectomy scar.  She was scanned free breathing.  I chose an isocenter.  She was then rescanned with deep respiration breath-hold and there was not any significant movement of the cardiac silhouette away from her tangential fields.  Therefore, she will be treated with free breathing.  I contoured her right breast tumor bed, dosimetry contoured the remainder of her normal structures.  I'm prescribing 5040 cGy in 28 sessions followed by a left breast tumor bed boost for a further 1400 cGy in 7 sessions with 15 MEV electrons.  She is now ready for 3-D simulation.

## 2014-05-31 ENCOUNTER — Encounter: Payer: Self-pay | Admitting: Radiation Oncology

## 2014-05-31 DIAGNOSIS — Z510 Encounter for antineoplastic radiation therapy: Secondary | ICD-10-CM | POA: Diagnosis not present

## 2014-05-31 NOTE — Progress Notes (Signed)
3-D simulation note: The patient completed 3-D simulation for treatment to her left breast.  She is setup to medial and lateral left breast tangents.  1 unique MLC and unique electronic compensator fields are utilized medially and laterally for a total of 4 complex treatment devices.  I initially prescribed 5040 cGy in 28 sessions but there was a significant volume (over 300 mL) of breast tissue receiving 105% of the prescription dose despite utilizing 10 MV photons laterally..  Therefore I am treating her with 4500 cGy in 25 sessions, and we'll modify her boost dose to the medial breast (1800 cGy in 9 sessions).  Dose volume histograms were obtained for the tumor bed of the left breast and also normal surrounding structures including the heart and lungs.  We met our departmental guidelines.

## 2014-06-05 ENCOUNTER — Ambulatory Visit: Admission: RE | Admit: 2014-06-05 | Payer: Medicaid Other | Source: Ambulatory Visit | Admitting: Radiation Oncology

## 2014-06-05 ENCOUNTER — Ambulatory Visit
Admission: RE | Admit: 2014-06-05 | Discharge: 2014-06-05 | Disposition: A | Discharge status: Home / Self Care | Payer: Medicaid Other | Source: Ambulatory Visit | Attending: Radiation Oncology | Admitting: Radiation Oncology

## 2014-06-06 ENCOUNTER — Ambulatory Visit: Payer: Medicaid Other | Admitting: Radiation Oncology

## 2014-06-06 ENCOUNTER — Ambulatory Visit: Payer: Medicaid Other

## 2014-06-07 ENCOUNTER — Ambulatory Visit
Admission: RE | Admit: 2014-06-07 | Discharge: 2014-06-07 | Disposition: A | Discharge status: Home / Self Care | Payer: Medicaid Other | Source: Ambulatory Visit | Attending: Radiation Oncology | Admitting: Radiation Oncology

## 2014-06-07 ENCOUNTER — Ambulatory Visit: Payer: Medicaid Other

## 2014-06-07 ENCOUNTER — Ambulatory Visit: Payer: Medicaid Other | Admitting: Radiation Oncology

## 2014-06-07 DIAGNOSIS — Z510 Encounter for antineoplastic radiation therapy: Secondary | ICD-10-CM | POA: Diagnosis not present

## 2014-06-08 ENCOUNTER — Ambulatory Visit: Payer: Medicaid Other

## 2014-06-08 ENCOUNTER — Ambulatory Visit
Admission: RE | Admit: 2014-06-08 | Discharge: 2014-06-08 | Disposition: A | Discharge status: Home / Self Care | Payer: Medicaid Other | Source: Ambulatory Visit | Attending: Radiation Oncology | Admitting: Radiation Oncology

## 2014-06-08 DIAGNOSIS — Z510 Encounter for antineoplastic radiation therapy: Secondary | ICD-10-CM | POA: Diagnosis not present

## 2014-06-09 ENCOUNTER — Ambulatory Visit: Payer: Medicaid Other | Admitting: Radiation Oncology

## 2014-06-09 ENCOUNTER — Ambulatory Visit
Admission: RE | Admit: 2014-06-09 | Discharge: 2014-06-09 | Disposition: A | Discharge status: Home / Self Care | Payer: Medicaid Other | Source: Ambulatory Visit | Attending: Radiation Oncology | Admitting: Radiation Oncology

## 2014-06-09 ENCOUNTER — Ambulatory Visit: Payer: Medicaid Other

## 2014-06-09 DIAGNOSIS — Z510 Encounter for antineoplastic radiation therapy: Secondary | ICD-10-CM | POA: Diagnosis not present

## 2014-06-10 ENCOUNTER — Ambulatory Visit: Payer: Medicaid Other | Admitting: Radiation Oncology

## 2014-06-11 ENCOUNTER — Ambulatory Visit: Payer: Medicaid Other

## 2014-06-12 ENCOUNTER — Encounter: Payer: Self-pay | Admitting: Radiation Oncology

## 2014-06-12 ENCOUNTER — Ambulatory Visit
Admission: RE | Admit: 2014-06-12 | Discharge: 2014-06-12 | Disposition: A | Discharge status: Home / Self Care | Payer: Medicaid Other | Source: Ambulatory Visit | Attending: Radiation Oncology | Admitting: Radiation Oncology

## 2014-06-12 ENCOUNTER — Ambulatory Visit: Payer: Medicaid Other

## 2014-06-12 VITALS — BP 123/88 | HR 61 | Temp 98.4°F | Ht 64.0 in | Wt 208.6 lb

## 2014-06-12 DIAGNOSIS — Z510 Encounter for antineoplastic radiation therapy: Secondary | ICD-10-CM | POA: Diagnosis not present

## 2014-06-12 DIAGNOSIS — C50312 Malignant neoplasm of lower-inner quadrant of left female breast: Secondary | ICD-10-CM

## 2014-06-12 NOTE — Progress Notes (Signed)
Janice Crawford has received 3 fractions to her left breast.  No voiced concerns at this time related to treatment.

## 2014-06-12 NOTE — Progress Notes (Signed)
Weekly Management Note:  Site: Left breast Current Dose:  540  cGy Projected Dose: 4500  cGy followed by boost  Narrative: The patient is seen today for routine under treatment assessment. CBCT/MVCT images/port films were reviewed. The chart was reviewed.   She is without complaints today except for some pruritus along her left breast wounds, similar to what she experienced after her C-section.  She went patient education today and has her skin products.  Physical Examination:  Filed Vitals:   06/12/14 1056  BP: 123/88  Pulse: 61  Temp: 98.4 F (36.9 C)  .  Weight: 208 lb 9.6 oz (94.62 kg).  There are no significant skin changes.  Impression: Tolerating radiation therapy well.  Plan: Continue radiation therapy as planned.

## 2014-06-13 ENCOUNTER — Ambulatory Visit
Admission: RE | Admit: 2014-06-13 | Discharge: 2014-06-13 | Disposition: A | Discharge status: Home / Self Care | Payer: Medicaid Other | Source: Ambulatory Visit | Attending: Radiation Oncology | Admitting: Radiation Oncology

## 2014-06-13 ENCOUNTER — Ambulatory Visit: Payer: Medicaid Other

## 2014-06-13 DIAGNOSIS — Z510 Encounter for antineoplastic radiation therapy: Secondary | ICD-10-CM | POA: Diagnosis not present

## 2014-06-14 ENCOUNTER — Ambulatory Visit
Admission: RE | Admit: 2014-06-14 | Discharge: 2014-06-14 | Disposition: A | Discharge status: Home / Self Care | Payer: Medicaid Other | Source: Ambulatory Visit | Attending: Radiation Oncology | Admitting: Radiation Oncology

## 2014-06-14 ENCOUNTER — Ambulatory Visit: Payer: Medicaid Other

## 2014-06-14 DIAGNOSIS — Z510 Encounter for antineoplastic radiation therapy: Secondary | ICD-10-CM | POA: Diagnosis not present

## 2014-06-15 ENCOUNTER — Ambulatory Visit
Admission: RE | Admit: 2014-06-15 | Discharge: 2014-06-15 | Disposition: A | Discharge status: Home / Self Care | Payer: Medicaid Other | Source: Ambulatory Visit | Attending: Radiation Oncology | Admitting: Radiation Oncology

## 2014-06-15 ENCOUNTER — Ambulatory Visit: Payer: Medicaid Other

## 2014-06-15 DIAGNOSIS — Z510 Encounter for antineoplastic radiation therapy: Secondary | ICD-10-CM | POA: Diagnosis not present

## 2014-06-16 ENCOUNTER — Ambulatory Visit
Admission: RE | Admit: 2014-06-16 | Discharge: 2014-06-16 | Disposition: A | Discharge status: Home / Self Care | Payer: Medicaid Other | Source: Ambulatory Visit | Attending: Radiation Oncology | Admitting: Radiation Oncology

## 2014-06-16 ENCOUNTER — Ambulatory Visit: Payer: Medicaid Other

## 2014-06-16 DIAGNOSIS — Z510 Encounter for antineoplastic radiation therapy: Secondary | ICD-10-CM | POA: Diagnosis not present

## 2014-06-17 ENCOUNTER — Ambulatory Visit: Payer: Medicaid Other

## 2014-06-18 ENCOUNTER — Ambulatory Visit: Payer: Medicaid Other

## 2014-06-19 ENCOUNTER — Encounter: Payer: Self-pay | Admitting: Radiation Oncology

## 2014-06-19 ENCOUNTER — Ambulatory Visit
Admission: RE | Admit: 2014-06-19 | Discharge: 2014-06-19 | Disposition: A | Discharge status: Home / Self Care | Payer: Medicaid Other | Source: Ambulatory Visit | Attending: Radiation Oncology | Admitting: Radiation Oncology

## 2014-06-19 VITALS — BP 119/80 | HR 72 | Temp 98.0°F | Resp 16 | Wt 213.8 lb

## 2014-06-19 DIAGNOSIS — C50312 Malignant neoplasm of lower-inner quadrant of left female breast: Secondary | ICD-10-CM

## 2014-06-19 DIAGNOSIS — Z510 Encounter for antineoplastic radiation therapy: Secondary | ICD-10-CM | POA: Diagnosis not present

## 2014-06-19 NOTE — Progress Notes (Signed)
Weekly rad txs left breast 8 completed, slight skin changes skin intact, , tender sore at nipple area, encouraged to use radiaplex bid, needs help with fmla papers,wil take to [place] Anon 10:42 AM'

## 2014-06-19 NOTE — Progress Notes (Signed)
Weekly Management Note:  Site: Left breast Current Dose:  1440  cGy Projected Dose: 4500  cGy followed by boost  Narrative: The patient is seen today for routine under treatment assessment. CBCT/MVCT images/port films were reviewed. The chart was reviewed.   She does report some soreness along her left nipple but is otherwise doing well.  She uses Radioplex gel.  Physical Examination:  Filed Vitals:   06/19/14 1039  BP: 119/80  Pulse: 72  Temp: 98 F (36.7 C)  Resp: 16  .  Weight: 213 lb 12.8 oz (96.979 kg).  There is mild hyperpigmentation the skin along the left breast.  No areas of desquamation.  Impression: Tolerating radiation therapy well.  Plan: Continue radiation therapy as planned.

## 2014-06-20 ENCOUNTER — Ambulatory Visit
Admission: RE | Admit: 2014-06-20 | Discharge: 2014-06-20 | Disposition: A | Discharge status: Home / Self Care | Payer: Medicaid Other | Source: Ambulatory Visit | Attending: Radiation Oncology | Admitting: Radiation Oncology

## 2014-06-20 DIAGNOSIS — Z510 Encounter for antineoplastic radiation therapy: Secondary | ICD-10-CM | POA: Diagnosis not present

## 2014-06-21 ENCOUNTER — Ambulatory Visit
Admission: RE | Admit: 2014-06-21 | Discharge: 2014-06-21 | Disposition: A | Discharge status: Home / Self Care | Payer: Medicaid Other | Source: Ambulatory Visit | Attending: Radiation Oncology | Admitting: Radiation Oncology

## 2014-06-21 ENCOUNTER — Ambulatory Visit: Payer: Medicaid Other

## 2014-06-21 DIAGNOSIS — Z510 Encounter for antineoplastic radiation therapy: Secondary | ICD-10-CM | POA: Diagnosis not present

## 2014-06-22 ENCOUNTER — Ambulatory Visit
Admission: RE | Admit: 2014-06-22 | Discharge: 2014-06-22 | Disposition: A | Discharge status: Home / Self Care | Payer: Medicaid Other | Source: Ambulatory Visit | Attending: Radiation Oncology | Admitting: Radiation Oncology

## 2014-06-22 ENCOUNTER — Encounter: Payer: Self-pay | Admitting: *Deleted

## 2014-06-22 DIAGNOSIS — Z510 Encounter for antineoplastic radiation therapy: Secondary | ICD-10-CM | POA: Diagnosis not present

## 2014-06-22 NOTE — Progress Notes (Signed)
[place] Work  Clinical Social Work was referred by nurse for assessment of psychosocial needs due to financial concerns.  Clinical Social Worker attempted to contact patient via phone to offer support and assess for needs.  Pt did not have vm set up to date. CSW will try to catch pt at radiation to address these concerns.   Loren Racer, [place] Worker [address]  [place] Phone: [phone] Fax: [phone]

## 2014-06-23 ENCOUNTER — Ambulatory Visit
Admission: RE | Admit: 2014-06-23 | Discharge: 2014-06-23 | Disposition: A | Discharge status: Home / Self Care | Payer: Medicaid Other | Source: Ambulatory Visit | Attending: Radiation Oncology | Admitting: Radiation Oncology

## 2014-06-23 DIAGNOSIS — Z510 Encounter for antineoplastic radiation therapy: Secondary | ICD-10-CM | POA: Diagnosis not present

## 2014-06-26 ENCOUNTER — Encounter: Payer: Self-pay | Admitting: Radiation Oncology

## 2014-06-26 ENCOUNTER — Telehealth: Payer: Self-pay | Admitting: *Deleted

## 2014-06-26 ENCOUNTER — Ambulatory Visit
Admission: RE | Admit: 2014-06-26 | Discharge: 2014-06-26 | Disposition: A | Discharge status: Home / Self Care | Payer: Medicaid Other | Source: Ambulatory Visit | Attending: Radiation Oncology | Admitting: Radiation Oncology

## 2014-06-26 VITALS — BP 124/72 | HR 60 | Temp 98.4°F | Resp 12 | Wt 209.7 lb

## 2014-06-26 DIAGNOSIS — C50312 Malignant neoplasm of lower-inner quadrant of left female breast: Secondary | ICD-10-CM

## 2014-06-26 DIAGNOSIS — Z510 Encounter for antineoplastic radiation therapy: Secondary | ICD-10-CM | POA: Diagnosis not present

## 2014-06-26 NOTE — Progress Notes (Signed)
She rates her pain as a 3 on a scale of 0-10. intermittent and tender over breast. Pt complains of loss of sleep. Pt left breast- warm dry and intact. Reports Pruritus and breast tenderness.  Pt denies edema. Pt continues to apply Radiaplex as directed. BP 124/72 mmHg  Pulse 60  Temp(Src) 98.4 F (36.9 C) (Oral)  Resp 12  Wt 209 lb 11.2 oz (95.119 kg)  SpO2 99%

## 2014-06-26 NOTE — Progress Notes (Signed)
Weekly Management Note:  Site: Left breast Current Dose:  2340  cGy Projected Dose: 4500  cGy followed by boost  Narrative: The patient is seen today for routine under treatment assessment. CBCT/MVCT images/port films were reviewed. The chart was reviewed.   Her major complaint is that of fatigue.  She does have mild pruritus.  She uses Radioplex gel.  Physical Examination:  Filed Vitals:   06/26/14 1134  BP: 124/72  Pulse: 60  Temp: 98.4 F (36.9 C)  Resp: 12  .  Weight: 209 lb 11.2 oz (95.119 kg).  There is hyperpigmentation the skin along the left breast with no areas of desquamation.  Impression: Tolerating radiation therapy well.  Plan: Continue radiation therapy as planned.

## 2014-06-26 NOTE — Telephone Encounter (Signed)
Called patient home phone  Which was disconnected,changed or no longer in service per message, then called patient friend Jeralyn Bennett, at [phone]  She is on hippa list ,asking if patient was ok, missed 10:15 am appt today  called and asked if she could call us at [phone] MD and Patric Dykes, RN

## 2014-06-27 ENCOUNTER — Ambulatory Visit
Admission: RE | Admit: 2014-06-27 | Discharge: 2014-06-27 | Disposition: A | Discharge status: Home / Self Care | Payer: Medicaid Other | Source: Ambulatory Visit | Attending: Radiation Oncology | Admitting: Radiation Oncology

## 2014-06-27 ENCOUNTER — Ambulatory Visit: Payer: Medicaid Other | Admitting: Radiation Oncology

## 2014-06-27 VITALS — BP 133/90 | HR 68 | Temp 98.3°F | Resp 12 | Wt 209.4 lb

## 2014-06-27 DIAGNOSIS — Z510 Encounter for antineoplastic radiation therapy: Secondary | ICD-10-CM | POA: Diagnosis not present

## 2014-06-27 DIAGNOSIS — C50312 Malignant neoplasm of lower-inner quadrant of left female breast: Secondary | ICD-10-CM

## 2014-06-27 NOTE — Progress Notes (Signed)
Clinic note:   The patient is seen today describing intermittent shooting right breast discomfort lasting just a few seconds, as severe as 4-5/10.  She is currently asymptomatic.  Physical examination: Unchanged from yesterday.  Impression: I explained to her that her pain is probably from nerve regeneration, and is normal.  Fortunately, this will become less intense and less frequent.  Plan: Continue with radiation therapy as prescribed.

## 2014-06-27 NOTE — Progress Notes (Signed)
Pt is here today due to having continued Right breast pain rating it a 4-5 on pain scale, pain is sharp- recurrent. Pt is having LEFT breast treated. There are no changes to the breast being treated since yesterday.

## 2014-06-28 ENCOUNTER — Ambulatory Visit
Admission: RE | Admit: 2014-06-28 | Discharge: 2014-06-28 | Disposition: A | Discharge status: Home / Self Care | Payer: Medicaid Other | Source: Ambulatory Visit | Attending: Radiation Oncology | Admitting: Radiation Oncology

## 2014-06-28 DIAGNOSIS — Z510 Encounter for antineoplastic radiation therapy: Secondary | ICD-10-CM | POA: Diagnosis not present

## 2014-06-29 ENCOUNTER — Ambulatory Visit
Admission: RE | Admit: 2014-06-29 | Discharge: 2014-06-29 | Disposition: A | Discharge status: Home / Self Care | Payer: Medicaid Other | Source: Ambulatory Visit | Attending: Radiation Oncology | Admitting: Radiation Oncology

## 2014-06-29 DIAGNOSIS — Z510 Encounter for antineoplastic radiation therapy: Secondary | ICD-10-CM | POA: Diagnosis not present

## 2014-06-30 ENCOUNTER — Ambulatory Visit
Admission: RE | Admit: 2014-06-30 | Discharge: 2014-06-30 | Disposition: A | Discharge status: Home / Self Care | Payer: Medicaid Other | Source: Ambulatory Visit | Attending: Radiation Oncology | Admitting: Radiation Oncology

## 2014-06-30 DIAGNOSIS — Z510 Encounter for antineoplastic radiation therapy: Secondary | ICD-10-CM | POA: Diagnosis not present

## 2014-07-03 ENCOUNTER — Encounter: Payer: Self-pay | Admitting: Radiation Oncology

## 2014-07-03 ENCOUNTER — Ambulatory Visit: Payer: Medicaid Other | Admitting: Radiation Oncology

## 2014-07-03 ENCOUNTER — Ambulatory Visit: Payer: Medicaid Other

## 2014-07-03 DIAGNOSIS — Z510 Encounter for antineoplastic radiation therapy: Secondary | ICD-10-CM | POA: Diagnosis not present

## 2014-07-03 NOTE — Progress Notes (Signed)
Virtual simulation note: The patient underwent virtual simulation for her left breast boost.  We decided to proceed with a photon boost to minimize the dose to her heart.  She set up to 3 field technique.  3 separate and unique MLCs were designed to conform the field.  She will be treated with mixed 6 MV/10 MV photons.  I am prescribing 1800 cGy 9 sessions.  An isodose plan is reviewed and accepted.

## 2014-07-04 ENCOUNTER — Encounter: Payer: Self-pay | Admitting: Radiation Oncology

## 2014-07-04 ENCOUNTER — Encounter: Payer: Medicaid Other | Admitting: Radiation Oncology

## 2014-07-04 ENCOUNTER — Ambulatory Visit
Admission: RE | Admit: 2014-07-04 | Discharge: 2014-07-04 | Disposition: A | Discharge status: Home / Self Care | Payer: Medicaid Other | Source: Ambulatory Visit | Attending: Radiation Oncology | Admitting: Radiation Oncology

## 2014-07-04 ENCOUNTER — Ambulatory Visit: Admission: RE | Admit: 2014-07-04 | Payer: Medicaid Other | Source: Ambulatory Visit | Admitting: Radiation Oncology

## 2014-07-04 VITALS — BP 121/91 | HR 62 | Temp 98.7°F | Ht 60.4 in | Wt 206.2 lb

## 2014-07-04 DIAGNOSIS — C50312 Malignant neoplasm of lower-inner quadrant of left female breast: Secondary | ICD-10-CM

## 2014-07-04 DIAGNOSIS — Z510 Encounter for antineoplastic radiation therapy: Secondary | ICD-10-CM | POA: Diagnosis not present

## 2014-07-04 NOTE — Progress Notes (Signed)
Weekly Management Note:  Site: Left breast Current Dose:  3240  cGy Projected Dose: 4500  cGy followed by 9 fraction boost  Narrative: The patient is seen today for routine under treatment assessment. CBCT/MVCT images/port films were reviewed. The chart was reviewed.   She missed treatment yesterday because of transportation difficulties.  She is without new complaints today.  She continues to have discomfort along her breast and nipple with occasional shooting pains.  She uses Radioplex gel.  Physical Examination:  Filed Vitals:   07/04/14 1157  BP: 121/91  Pulse: 62  Temp: 98.7 F (37.1 C)  .  Weight: 206 lb 3.2 oz (93.532 kg).  There is hyperpigmentation the skin with no areas of desquamation.  Impression: Tolerating radiation therapy well.  Plan: Continue radiation therapy as planned.

## 2014-07-04 NOTE — Progress Notes (Signed)
Janice Crawford has received 18 fractions to her left breast.  She reports tenderness of breast at the nipple area.  Skin intact with hyperpigmentation

## 2014-07-05 ENCOUNTER — Ambulatory Visit
Admission: RE | Admit: 2014-07-05 | Discharge: 2014-07-05 | Disposition: A | Discharge status: Home / Self Care | Payer: Medicaid Other | Source: Ambulatory Visit | Attending: Radiation Oncology | Admitting: Radiation Oncology

## 2014-07-05 DIAGNOSIS — Z510 Encounter for antineoplastic radiation therapy: Secondary | ICD-10-CM | POA: Diagnosis not present

## 2014-07-06 ENCOUNTER — Ambulatory Visit
Admission: RE | Admit: 2014-07-06 | Discharge: 2014-07-06 | Disposition: A | Discharge status: Home / Self Care | Payer: Medicaid Other | Source: Ambulatory Visit | Attending: Radiation Oncology | Admitting: Radiation Oncology

## 2014-07-06 DIAGNOSIS — Z510 Encounter for antineoplastic radiation therapy: Secondary | ICD-10-CM | POA: Diagnosis not present

## 2014-07-07 ENCOUNTER — Ambulatory Visit
Admission: RE | Admit: 2014-07-07 | Discharge: 2014-07-07 | Disposition: A | Discharge status: Home / Self Care | Payer: Medicaid Other | Source: Ambulatory Visit | Attending: Radiation Oncology | Admitting: Radiation Oncology

## 2014-07-07 DIAGNOSIS — Z510 Encounter for antineoplastic radiation therapy: Secondary | ICD-10-CM | POA: Diagnosis not present

## 2014-07-10 ENCOUNTER — Encounter: Payer: Self-pay | Admitting: *Deleted

## 2014-07-10 ENCOUNTER — Ambulatory Visit: Admission: RE | Admit: 2014-07-10 | Payer: Medicaid Other | Source: Ambulatory Visit | Admitting: Radiation Oncology

## 2014-07-10 ENCOUNTER — Ambulatory Visit: Payer: Medicaid Other | Admitting: Radiation Oncology

## 2014-07-10 ENCOUNTER — Ambulatory Visit: Payer: Medicaid Other

## 2014-07-10 ENCOUNTER — Ambulatory Visit
Admission: RE | Admit: 2014-07-10 | Discharge: 2014-07-10 | Disposition: A | Discharge status: Home / Self Care | Payer: Medicaid Other | Source: Ambulatory Visit | Attending: Radiation Oncology | Admitting: Radiation Oncology

## 2014-07-10 NOTE — Progress Notes (Signed)
[place] Clinical Social Work  [facility] representative received referral from Radiation Oncology for transportation concerns.  Patient usually drives herself to treatment but reported she was having car trouble and unable to drive.  CSW attempted to contact patient multiple times.  CSW was unable to reach patient or leave a message due to patients voicemail box being full.  CSW will continue attempting to contact patient.    Johnnye Lana, MSW, LCSW, OSW-C Clinical Social Worker [facility] [phone]
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
## 2014-07-11 ENCOUNTER — Ambulatory Visit
Admission: RE | Admit: 2014-07-11 | Discharge: 2014-07-11 | Disposition: A | Discharge status: Home / Self Care | Payer: Medicaid Other | Source: Ambulatory Visit | Attending: Radiation Oncology | Admitting: Radiation Oncology

## 2014-07-11 ENCOUNTER — Ambulatory Visit: Admission: RE | Admit: 2014-07-11 | Payer: Medicaid Other | Source: Ambulatory Visit

## 2014-07-11 ENCOUNTER — Encounter: Payer: Self-pay | Admitting: *Deleted

## 2014-07-11 ENCOUNTER — Ambulatory Visit: Payer: Medicaid Other | Admitting: Radiation Oncology

## 2014-07-11 ENCOUNTER — Ambulatory Visit: Payer: Medicaid Other

## 2014-07-11 DIAGNOSIS — Z510 Encounter for antineoplastic radiation therapy: Secondary | ICD-10-CM | POA: Diagnosis not present

## 2014-07-11 NOTE — Progress Notes (Signed)
Janice Crawford has arranged transportation for Janice Crawford today and Janice Crawford is arranging a 30 day bus pass, which was acceptable to Janice Crawford, via [employer] to ensure that Janice Crawford does not miss any of her radiation therapy appointments.

## 2014-07-12 ENCOUNTER — Ambulatory Visit: Payer: Medicaid Other

## 2014-07-12 ENCOUNTER — Ambulatory Visit
Admission: RE | Admit: 2014-07-12 | Discharge: 2014-07-12 | Disposition: A | Discharge status: Home / Self Care | Payer: Medicaid Other | Source: Ambulatory Visit | Attending: Radiation Oncology | Admitting: Radiation Oncology

## 2014-07-12 VITALS — BP 123/79 | HR 78 | Temp 98.1°F | Resp 12 | Wt 204.3 lb

## 2014-07-12 DIAGNOSIS — Z510 Encounter for antineoplastic radiation therapy: Secondary | ICD-10-CM | POA: Diagnosis not present

## 2014-07-12 DIAGNOSIS — C50312 Malignant neoplasm of lower-inner quadrant of left female breast: Secondary | ICD-10-CM

## 2014-07-12 NOTE — Progress Notes (Signed)
She is currently in no pain.  Pt left breast- positive for Hyperpigmentation.  Pt denies edema. Pt continues to apply Radiaplex as directed. BP 123/79 mmHg  Pulse 78  Temp(Src) 98.1 F (36.7 C) (Oral)  Resp 12  Wt 204 lb 4.8 oz (92.67 kg)  SpO2 100%

## 2014-07-12 NOTE — Progress Notes (Signed)
Weekly Management Note:  Site: Left breast Current Dose:  4140  cGy Projected Dose: 4500  CGy followed by 9 fraction boost  Narrative: The patient is seen today for routine under treatment assessment. CBCT/MVCT images/port films were reviewed. The chart was reviewed.   She missed 2 treatment days this week because of transportation difficulties.  Social work will attend to her transportation problems.  She is without complaints today.  She uses Radioplex gel.  Physical Examination:  Filed Vitals:   07/12/14 1323  BP: 123/79  Pulse: 78  Temp: 98.1 F (36.7 C)  Resp: 12  .  Weight: 204 lb 4.8 oz (92.67 kg).  There is mild to moderate hyperpigmentation the skin along the left breast with no areas of desquamation.  Impression: Tolerating radiation therapy well.  I emphasized to her the need that she needs to come in for her daily scheduled treatments.  Plan: Continue radiation therapy as planned.

## 2014-07-13 ENCOUNTER — Ambulatory Visit: Payer: Medicaid Other

## 2014-07-13 ENCOUNTER — Ambulatory Visit
Admission: RE | Admit: 2014-07-13 | Discharge: 2014-07-13 | Disposition: A | Discharge status: Home / Self Care | Payer: Medicaid Other | Source: Ambulatory Visit | Attending: Radiation Oncology | Admitting: Radiation Oncology

## 2014-07-13 DIAGNOSIS — Z510 Encounter for antineoplastic radiation therapy: Secondary | ICD-10-CM | POA: Diagnosis not present

## 2014-07-14 ENCOUNTER — Ambulatory Visit: Payer: Medicaid Other

## 2014-07-14 ENCOUNTER — Ambulatory Visit
Admission: RE | Admit: 2014-07-14 | Discharge: 2014-07-14 | Disposition: A | Discharge status: Home / Self Care | Payer: Medicaid Other | Source: Ambulatory Visit | Attending: Radiation Oncology | Admitting: Radiation Oncology

## 2014-07-14 DIAGNOSIS — Z510 Encounter for antineoplastic radiation therapy: Secondary | ICD-10-CM | POA: Diagnosis not present

## 2014-07-15 ENCOUNTER — Ambulatory Visit: Payer: Medicaid Other

## 2014-07-17 ENCOUNTER — Ambulatory Visit: Payer: Medicaid Other

## 2014-07-17 ENCOUNTER — Ambulatory Visit
Admission: RE | Admit: 2014-07-17 | Discharge: 2014-07-17 | Disposition: A | Discharge status: Home / Self Care | Payer: Medicaid Other | Source: Ambulatory Visit | Attending: Radiation Oncology | Admitting: Radiation Oncology

## 2014-07-17 ENCOUNTER — Encounter: Payer: Self-pay | Admitting: Radiation Oncology

## 2014-07-17 VITALS — BP 133/78 | HR 79 | Temp 98.7°F | Ht 60.4 in | Wt 208.1 lb

## 2014-07-17 DIAGNOSIS — Z510 Encounter for antineoplastic radiation therapy: Secondary | ICD-10-CM | POA: Diagnosis not present

## 2014-07-17 DIAGNOSIS — C50312 Malignant neoplasm of lower-inner quadrant of left female breast: Secondary | ICD-10-CM

## 2014-07-17 NOTE — Progress Notes (Signed)
Janice Crawford has received 26 fractions to her left breast.   Her skin remains intact with hyperpigmentation. She reports level 1/10 tenderness of the treated breast.

## 2014-07-17 NOTE — Progress Notes (Signed)
Weekly Management Note:  Site: Left breast Current Dose:  4700  cGy Projected Dose: 6300  cGy  Narrative: The patient is seen today for routine under treatment assessment. CBCT/MVCT images/port films were reviewed. The chart was reviewed.   She is without new complaints today.  She does have mild tenderness of the left breast.  She has a difficult living arrangement with her brother and social work is contacted.  Physical Examination:  Filed Vitals:   07/17/14 1125  BP: 133/78  Pulse: 79  Temp: 98.7 F (37.1 C)  .  Weight: 208 lb 1.6 oz (94.394 kg).  There is hyperpigmentation the skin along the left breast with patchy dry desquamation but no areas of moist desquamation.  Impression: Tolerating radiation therapy well.  Plan: Continue radiation therapy as planned.

## 2014-07-18 ENCOUNTER — Ambulatory Visit
Admission: RE | Admit: 2014-07-18 | Discharge: 2014-07-18 | Disposition: A | Discharge status: Home / Self Care | Payer: Medicaid Other | Source: Ambulatory Visit | Attending: Radiation Oncology | Admitting: Radiation Oncology

## 2014-07-18 ENCOUNTER — Encounter: Payer: Self-pay | Admitting: *Deleted

## 2014-07-18 DIAGNOSIS — Z510 Encounter for antineoplastic radiation therapy: Secondary | ICD-10-CM | POA: Diagnosis not present

## 2014-07-18 NOTE — Progress Notes (Signed)
[facility] Clinical Social Worker  Clinical Social Worker received referral from radiation oncology RN for housing and assessment of psychosocial needs.  CSW met with patient after her treatment in [place] office at [facility].  Patient informed CSW that she has not stable or permanent housing since before she was diagnosed with cancer.  She is currently living with her brother, but due to multiple family issues is looking for an alternative living situation.  CSW and patient discussed housing resources in the community.  Patient stated she had been to the housing authority to apply for assistance but was told the waiting list was currently "frozen" and they were not accepting new applications.  Patient was approved for housing assistance in 2009 but stated her approval letter was mailed to an old address, which then expired and she was unable to claim the benefit.  After identifying available housing resources patient and CSW reviewed and completed application for [employer] and [employer].  Both applications were faxed and patient planned to call and confirm they were received later today.  CSW also provided patient with information to [facility] and contact need to initiate the application process.  CSW also encouraged patient to contact the [employer] for emergency assistance resources.  Patient verbalized understanding and plans to call organizations. Patient was appreciative of contact and CSW support.   Contact information was provided and CSw encouraged patient to call with any questions or concerns.      Johnnye Lana, MSW, LCSW, OSW-C Clinical Social Worker [facility] [phone]

## 2014-07-19 ENCOUNTER — Ambulatory Visit
Admission: RE | Admit: 2014-07-19 | Discharge: 2014-07-19 | Disposition: A | Discharge status: Home / Self Care | Payer: Medicaid Other | Source: Ambulatory Visit | Attending: Radiation Oncology | Admitting: Radiation Oncology

## 2014-07-19 DIAGNOSIS — Z510 Encounter for antineoplastic radiation therapy: Secondary | ICD-10-CM | POA: Diagnosis not present

## 2014-07-20 ENCOUNTER — Ambulatory Visit: Payer: Medicaid Other

## 2014-07-20 ENCOUNTER — Ambulatory Visit
Admission: RE | Admit: 2014-07-20 | Discharge: 2014-07-20 | Disposition: A | Discharge status: Home / Self Care | Payer: Medicaid Other | Source: Ambulatory Visit | Attending: Radiation Oncology | Admitting: Radiation Oncology

## 2014-07-20 DIAGNOSIS — Z510 Encounter for antineoplastic radiation therapy: Secondary | ICD-10-CM | POA: Diagnosis not present

## 2014-07-21 ENCOUNTER — Ambulatory Visit
Admission: RE | Admit: 2014-07-21 | Discharge: 2014-07-21 | Disposition: A | Discharge status: Home / Self Care | Payer: Medicaid Other | Source: Ambulatory Visit | Attending: Radiation Oncology | Admitting: Radiation Oncology

## 2014-07-21 ENCOUNTER — Ambulatory Visit: Payer: Medicaid Other

## 2014-07-21 DIAGNOSIS — Z510 Encounter for antineoplastic radiation therapy: Secondary | ICD-10-CM | POA: Diagnosis not present

## 2014-07-24 ENCOUNTER — Ambulatory Visit: Payer: Medicaid Other

## 2014-07-25 ENCOUNTER — Ambulatory Visit: Payer: Medicaid Other

## 2014-07-25 ENCOUNTER — Ambulatory Visit
Admission: RE | Admit: 2014-07-25 | Discharge: 2014-07-25 | Disposition: A | Discharge status: Home / Self Care | Payer: Medicaid Other | Source: Ambulatory Visit | Attending: Radiation Oncology | Admitting: Radiation Oncology

## 2014-07-25 ENCOUNTER — Encounter: Payer: Self-pay | Admitting: Radiation Oncology

## 2014-07-25 VITALS — BP 131/87 | HR 64 | Temp 98.9°F | Ht 60.0 in | Wt 207.3 lb

## 2014-07-25 DIAGNOSIS — C50312 Malignant neoplasm of lower-inner quadrant of left female breast: Secondary | ICD-10-CM

## 2014-07-25 DIAGNOSIS — Z510 Encounter for antineoplastic radiation therapy: Secondary | ICD-10-CM | POA: Diagnosis not present

## 2014-07-25 NOTE — Progress Notes (Signed)
Janice Crawford has received 36 fractions to her left breast and has received 6 fractions to her boost field.  Will complete on Friday of this week.  Skin intact in treatment field and note hyperpigmentation with dry desquamation in the inframmary fold, the areola region, and in the right axilla.

## 2014-07-25 NOTE — Progress Notes (Signed)
Weekly Management Note:  Site: Left breast Current Dose:  5700  cGy Projected Dose: 6300  cGy  Narrative: The patient is seen today for routine under treatment assessment. CBCT/MVCT images/port films were reviewed. The chart was reviewed.   She is without complaints today.  She uses Biafine cream.  Physical Examination:  Filed Vitals:   07/25/14 1153  BP: 131/87  Pulse: 64  Temp: 98.9 F (37.2 C)  .  Weight: 207 lb 4.8 oz (94.031 kg).  There is marked hyperpigmentation the skin with dry desquamation along her axilla and inframammary area region as expected.  No areas of moist desquamation.  Impression: Tolerating radiation therapy well.  She will finish radiation therapy this Friday.  Plan: Continue radiation therapy as planned.  One-month follow-up visit after completion of radiation therapy.

## 2014-07-26 ENCOUNTER — Ambulatory Visit: Payer: Medicaid Other

## 2014-07-26 ENCOUNTER — Ambulatory Visit
Admission: RE | Admit: 2014-07-26 | Discharge: 2014-07-26 | Disposition: A | Discharge status: Home / Self Care | Payer: Medicaid Other | Source: Ambulatory Visit | Attending: Radiation Oncology | Admitting: Radiation Oncology

## 2014-07-26 DIAGNOSIS — Z510 Encounter for antineoplastic radiation therapy: Secondary | ICD-10-CM | POA: Diagnosis not present

## 2014-07-27 ENCOUNTER — Ambulatory Visit: Payer: Medicaid Other

## 2014-07-27 ENCOUNTER — Encounter: Payer: Self-pay | Admitting: Radiation Oncology

## 2014-07-27 ENCOUNTER — Ambulatory Visit
Admission: RE | Admit: 2014-07-27 | Discharge: 2014-07-27 | Disposition: A | Discharge status: Home / Self Care | Payer: Medicaid Other | Source: Ambulatory Visit | Attending: Radiation Oncology | Admitting: Radiation Oncology

## 2014-07-27 VITALS — BP 138/91 | HR 76 | Temp 98.5°F | Ht 60.0 in | Wt 205.2 lb

## 2014-07-27 DIAGNOSIS — Z510 Encounter for antineoplastic radiation therapy: Secondary | ICD-10-CM | POA: Diagnosis not present

## 2014-07-27 DIAGNOSIS — C50312 Malignant neoplasm of lower-inner quadrant of left female breast: Secondary | ICD-10-CM

## 2014-07-27 NOTE — Progress Notes (Signed)
Clinic note: The patient is seen today for a skin check.  She will finish her left breast radiation therapy tomorrow.  On examination there is hyperpigmentation the skin with patchy dry desquamation with no areas of moist desquamation.  Plan: She will finish her radiation therapy tomorrow and return to see me for a follow-up visit in one month.

## 2014-07-27 NOTE — Progress Notes (Addendum)
[name] has completed treamtent to her left breast. Denies any pain at this time. Dry desquamation and hyperpigmentation is evident on right breast, axilla and the inframmary fold.

## 2014-07-27 NOTE — Progress Notes (Signed)
See dictated note from earlier today. 

## 2014-07-28 ENCOUNTER — Ambulatory Visit: Payer: Medicaid Other

## 2014-07-28 ENCOUNTER — Ambulatory Visit
Admission: RE | Admit: 2014-07-28 | Discharge: 2014-07-28 | Disposition: A | Discharge status: Home / Self Care | Payer: Medicaid Other | Source: Ambulatory Visit | Attending: Radiation Oncology | Admitting: Radiation Oncology

## 2014-07-28 DIAGNOSIS — Z510 Encounter for antineoplastic radiation therapy: Secondary | ICD-10-CM | POA: Diagnosis not present

## 2014-07-31 ENCOUNTER — Encounter: Payer: Self-pay | Admitting: Radiation Oncology

## 2014-07-31 ENCOUNTER — Ambulatory Visit: Payer: Medicaid Other

## 2014-07-31 ENCOUNTER — Telehealth: Payer: Self-pay | Admitting: *Deleted

## 2014-07-31 NOTE — Telephone Encounter (Signed)
INFORMED PT. THAT THE PRINTED PRESCRIPTION FOR TAMOXIFEN GIVEN TO PT. ON 05/17/14 BY DR.GUDENA WAS TO BE STARTED AFTER SHE FINISHED RADIATION. PT. NEEDS TO TAKE THE PRESCRIPTION TO HER PHARMACY SO IT CAN BE FILLED. SHE VOICES UNDERSTANDING.

## 2014-07-31 NOTE — Progress Notes (Signed)
[place] Radiation Oncology End of Treatment Note  Name:Janice Crawford  Date: 07/31/2014 MBT:[id number] DOB:[birth year]   Status:outpatient    CC: Abby Potash, PA-C  Dr. [name]  REFERRING PHYSICIAN: Dr. 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  Left breast boost 3 field technique with mixed 6 MV/10 MV photons.           NARRATIVE:   She tolerated her treatment generally well with the expected degree of radiation dermatitis/hyperpigmentation and dry desquamation the skin.  No areas of moist desquamation by completion of therapy.  She used Biafine cream.                         PLAN: Routine followup in one month. Patient instructed to call if questions or worsening complaints in interim.

## 2014-08-01 ENCOUNTER — Other Ambulatory Visit: Payer: Self-pay | Admitting: *Deleted

## 2014-08-01 DIAGNOSIS — C50312 Malignant neoplasm of lower-inner quadrant of left female breast: Secondary | ICD-10-CM

## 2014-08-01 MED ORDER — TAMOXIFEN CITRATE 20 MG PO TABS: 20.0000 mg | ORAL_TABLET | Freq: Every day | ORAL | Status: DC

## 2014-08-01 NOTE — Telephone Encounter (Signed)
Pt. Cannot find the prescription given to her for Tamoxifen on 3.23/16. Escribed this prescription to [facility] @ [place]

## 2014-08-21 ENCOUNTER — Telehealth: Payer: Self-pay | Admitting: Hematology and Oncology

## 2014-08-21 ENCOUNTER — Other Ambulatory Visit: Payer: Self-pay

## 2014-08-21 NOTE — Telephone Encounter (Signed)
Spoke with patient and she is aware of her new schedule

## 2014-08-22 ENCOUNTER — Ambulatory Visit: Payer: Medicaid Other | Admitting: Hematology and Oncology

## 2014-09-06 ENCOUNTER — Encounter: Payer: Self-pay | Admitting: Radiation Oncology

## 2014-09-07 NOTE — Assessment & Plan Note (Signed)
Left lumpectomy 04/19/2014: IDC grade 2, 1.5 cm, intermediate grade DCIS, 0/2 lymph nodes, ER 90%, PR 90%, HER-2 negative ratio 1.33, Ki-67 20%, Oncotype DX recurrence score 23, 15% ROR Right lumpectomy: Fibrocystic changes no malignancy. S/P adjuvant XRT 07/28/14; Started Tamoxifen July 2016  Tamoxifen Toxicities:  RTC in 6 months

## 2014-09-08 ENCOUNTER — Telehealth: Payer: Self-pay | Admitting: Hematology and Oncology

## 2014-09-08 ENCOUNTER — Other Ambulatory Visit: Payer: Self-pay

## 2014-09-08 ENCOUNTER — Ambulatory Visit: Payer: Medicaid Other | Admitting: Hematology and Oncology

## 2014-09-08 NOTE — Telephone Encounter (Signed)
s.w. pt and advised on July appt....pt ok and aware °

## 2014-09-12 ENCOUNTER — Ambulatory Visit: Admission: RE | Admit: 2014-09-12 | Payer: Medicaid Other | Source: Ambulatory Visit | Admitting: Radiation Oncology

## 2014-09-12 ENCOUNTER — Telehealth: Payer: Self-pay | Admitting: *Deleted

## 2014-09-12 HISTORY — DX: Personal history of irradiation: Z92.3
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
## 2014-09-20 ENCOUNTER — Other Ambulatory Visit: Payer: Self-pay

## 2014-09-20 ENCOUNTER — Ambulatory Visit: Payer: Medicaid Other | Admitting: Hematology and Oncology

## 2014-09-20 NOTE — Assessment & Plan Note (Signed)
Left lumpectomy 04/19/2014: IDC grade 2, 1.5 cm, intermediate grade DCIS, 0/2 lymph nodes, ER 90%, PR 90%, HER-2 negative ratio 1.33, Ki-67 20%, Oncotype DX recurrence score 23, 15% ROR Right lumpectomy: Fibrocystic changes no malignancy; status post radiation therapy completed 07/28/14

## 2014-09-21 ENCOUNTER — Telehealth: Payer: Self-pay | Admitting: Hematology and Oncology

## 2014-09-21 NOTE — Telephone Encounter (Signed)
Called patient to reschedule her missed appointment and her mailbox is full,letter and calendar mailed with new appointment

## 2014-09-27 ENCOUNTER — Telehealth: Payer: Self-pay | Admitting: *Deleted

## 2014-09-28 ENCOUNTER — Telehealth: Payer: Self-pay | Admitting: Hematology and Oncology

## 2014-09-28 ENCOUNTER — Ambulatory Visit (HOSPITAL_BASED_OUTPATIENT_CLINIC_OR_DEPARTMENT_OTHER): Payer: Medicaid Other | Admitting: Hematology and Oncology

## 2014-09-28 ENCOUNTER — Encounter: Payer: Self-pay | Admitting: Hematology and Oncology

## 2014-09-28 VITALS — BP 135/78 | HR 76 | Temp 98.9°F | Resp 18 | Ht 60.0 in | Wt 203.4 lb

## 2014-09-28 DIAGNOSIS — C50312 Malignant neoplasm of lower-inner quadrant of left female breast: Secondary | ICD-10-CM

## 2014-09-28 DIAGNOSIS — Z801 Family history of malignant neoplasm of trachea, bronchus and lung: Secondary | ICD-10-CM | POA: Diagnosis not present

## 2014-09-28 NOTE — Assessment & Plan Note (Signed)
Left lumpectomy 04/19/2014: IDC grade 2, 1.5 cm, intermediate grade DCIS, 0/2 lymph nodes, ER 90%, PR 90%, HER-2 negative ratio 1.33, Ki-67 20%, Oncotype DX recurrence score 23, 15% ROR Right lumpectomy: Fibrocystic changes no malignancy. Completed adjuvant radiation 06/08/2014 to 07/28/2014 (Left breast 4500 cGy in 25 sessions, left breast boost 1800 cGy in 9 sessions)  Recommendation: 1.  Adjuvant antiestrogen therapy with tamoxifen 20 mg daily 10 years 2.  Patient fully understands the risks and benefits of tamoxifen therapy.   Return to clinic in 3 months for follow-up to assess tolerability of tamoxifen.

## 2014-09-28 NOTE — Progress Notes (Signed)
Patient Care Team: Abby Potash, PA-C as PCP - General (Physician Assistant) [name], MD as Consulting Physician (General Surgery) Nicholas Lose, MD as Consulting Physician (Hematology and Oncology) Arloa Koh, MD as Consulting Physician (Radiation Oncology)  DIAGNOSIS: Cancer of lower-inner quadrant of left female breast   Staging form: Breast, AJCC 7th Edition     Clinical stage from 12/28/2013: Stage IA (T1c, N0, M0) - Unsigned     Pathologic stage from 04/24/2014: Stage IA (T1c, N0, cM0) - Signed by Enid Cutter, MD on 05/04/2014       Staging comments: Staged on final lumpectomy specimen by Dr. 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   04/19/2014 Surgery Left lumpectomy: IDC grade 2, 1.5 cm, intermediate grade DCIS, 0/2 lymph nodes, ER 90%, PR 90%, HER-2 negative ratio 1.33, Ki-67 20%,  Oncotype DX recurrence score 23, 15% ROR   06/08/2014 - 07/28/2014 Radiation Therapy Left breast4500 cGy in 25 sessions, left breast boost 1800 cGy in 9 sessions    CHIEF COMPLIANT:   INTERVAL HISTORY: Janice Crawford is a     REVIEW OF SYSTEMS:   Constitutional: Denies fevers, chills or abnormal weight loss Eyes: Denies blurriness of vision Ears, nose, mouth, throat, and face: Denies mucositis or sore throat Respiratory: Denies cough, dyspnea or wheezes Cardiovascular: Denies palpitation, chest discomfort or lower extremity  swelling Gastrointestinal:  Denies nausea, heartburn or change in bowel habits Skin: Denies abnormal skin rashes Lymphatics: Denies new lymphadenopathy or easy bruising Neurological:Denies numbness, tingling or new weaknesses Behavioral/Psych: Mood is stable, no new changes  Breast:  denies any pain or lumps or nodules in either breasts All other systems were reviewed with the patient and are negative.  I have reviewed the past medical history, past surgical history, social history and family history with the patient and they are unchanged from previous note.  ALLERGIES:  is allergic to latex.  MEDICATIONS:  Current Outpatient Prescriptions  Medication Sig Dispense Refill  . hyaluronate sodium (RADIAPLEXRX) GEL Apply 1 application topically once.    . Multiple Vitamin (MULTIVITAMIN) tablet Take 1 tablet by mouth daily.    . non-metallic deodorant Jethro Poling) MISC Apply 1 application topically daily as needed.    Marland Kitchen oxyCODONE-acetaminophen (ROXICET) 5-325 MG per tablet Take 1-2 tablets by mouth every 4 (four) hours as needed for severe pain. 30 tablet 0  . tamoxifen (NOLVADEX) 20 MG tablet Take 1 tablet (20 mg total) by mouth daily. Start after radiation is complete 90 tablet 3  . vitamin B-12 (CYANOCOBALAMIN) 100 MCG tablet Take 100 mcg by mouth daily.     No current facility-administered medications for this visit.    PHYSICAL EXAMINATION: ECOG PERFORMANCE STATUS: 1 - Symptomatic but completely ambulatory  Filed Vitals:   09/28/14 1426  BP: 135/78  Pulse: 76  Temp: 98.9 F (37.2 C)  Resp: 18   Filed Weights   09/28/14 1426  Weight: 203 lb 6.4 oz (92.262 kg)  GENERAL:alert, no distress and comfortable SKIN: skin color, texture, turgor are normal, no rashes or significant lesions EYES: normal, Conjunctiva are pink and non-injected, sclera clear OROPHARYNX:no exudate, no erythema and lips, buccal mucosa, and tongue normal  NECK: supple, thyroid normal size, non-tender, without  nodularity LYMPH:  no palpable lymphadenopathy in the cervical, axillary or inguinal LUNGS: clear to auscultation and percussion with normal breathing effort HEART: regular rate & rhythm and no murmurs and no lower extremity edema ABDOMEN:abdomen soft, non-tender and normal bowel sounds Musculoskeletal:no cyanosis of digits and no clubbing  NEURO: alert & oriented x 3 with fluent speech, no focal motor/sensory deficits  LABORATORY DATA:  I have reviewed the data as listed   Chemistry      Component Value Date/Time   NA 138 12/28/2013 0902   NA 140 07/06/2011 1455   K 4.0 12/28/2013 0902   K 3.7 07/06/2011 1455   CL 103 07/06/2011 1455   CO2 25 12/28/2013 0902   BUN 10.7 12/28/2013 0902   BUN 10 07/06/2011 1455   CREATININE 0.9 12/28/2013 0902   CREATININE 0.90 07/06/2011 1455      Component Value Date/Time   CALCIUM 9.2 12/28/2013 0902   ALKPHOS 39* 12/28/2013 0902   AST 15 12/28/2013 0902   ALT 12 12/28/2013 0902   BILITOT 0.47 12/28/2013 0902       Lab Results  Component Value Date   WBC 8.2 12/28/2013   HGB 12.0 04/19/2014   HCT 37.0 12/28/2013   MCV 91.3 12/28/2013   PLT 309 12/28/2013   NEUTROABS 4.3 12/28/2013   ASSESSMENT & PLAN:  Cancer of lower-inner quadrant of left female breast Left lumpectomy 04/19/2014: IDC grade 2, 1.5 cm, intermediate grade DCIS, 0/2 lymph nodes, ER 90%, PR 90%, HER-2 negative ratio 1.33, Ki-67 20%, Oncotype DX recurrence score 23, 15% ROR Right lumpectomy: Fibrocystic changes no malignancy. Completed adjuvant radiation 06/08/2014 to 07/28/2014 (Left breast 4500 cGy in 25 sessions, left breast boost 1800 cGy in 9 sessions), started tamoxifen in June 2016 20 mg daily 10 years  Tamoxifen toxicities: 1.  Itching of the skin 2.  Patient fully understands the risks and benefits of tamoxifen therapy. Denies any hot flashes Patient reported to me that her father is dying from stage IV lung cancer.  Survivorship: 1. I discussed with  her the importance of physical exercise. She has been trying to walk but she feels short of breath easily. 2. Recommended participation the [employer] live strong program. 3. Recommended participation in the [facility] program. 4. Encouraged her to eat less pastas and breads and eat more fruits and vegetables.   Return to clinic in 6 months for follow-up to assess tolerability of tamoxifen.   No orders of the defined types were placed in this encounter.   The patient has a good understanding of the overall plan. she agrees with it. she will call with any problems that may develop before the next visit here.   Rulon Eisenmenger, MD

## 2014-09-28 NOTE — Telephone Encounter (Signed)
Patient did not stop by scheduling,appointment made and avs mailed to patient

## 2014-10-17 ENCOUNTER — Telehealth: Payer: Self-pay | Admitting: Adult Health

## 2014-10-17 NOTE — Telephone Encounter (Signed)
I spoke with Janice Crawford this morning about her eligibility to come see [place] in the survivorship clinic now that she has completed treatment for breast cancer.  She tells me that she is taking the Tamoxifen, as prescribed by Dr. Lindi Adie and is overall tolerating it pretty well.    She is experiencing some shortness of breath with walking and some decreased stamina & strength since completing radiation in June.  I offered her reassurance that some deconditioning and fatigue is common after completing cancer treatments.  Dr. Lindi Adie recommended that she try [employer], but Janice Crawford has not had a chance to go by the [facility] to get signed up.  I offered to contact the [place] coordinator on her behalf, if that would be helpful, and Janice Crawford agreed.  I have contacted Elaina Hoops at the [facility] to help get Janice Crawford enrolled.  She stated that she would like to come in for a survivorship appt, but needs to wait until her children return to school and she is able to get back in their routine before she can commit to any additional appointments at this time.  She stated that she would like [place] to contact her in mid to late September for her survivorship visit, and we will do that.    We look forward to participating in her care.   Mike Craze, NP [place] [phone]

## 2014-10-20 ENCOUNTER — Other Ambulatory Visit: Payer: Self-pay | Admitting: Adult Health

## 2014-10-20 DIAGNOSIS — C50312 Malignant neoplasm of lower-inner quadrant of left female breast: Secondary | ICD-10-CM

## 2014-11-02 ENCOUNTER — Encounter: Payer: Self-pay | Admitting: Genetic Counselor

## 2014-11-15 ENCOUNTER — Ambulatory Visit: Payer: Medicaid Other | Admitting: Physical Therapy

## 2014-11-17 ENCOUNTER — Ambulatory Visit (HOSPITAL_BASED_OUTPATIENT_CLINIC_OR_DEPARTMENT_OTHER): Payer: Medicaid Other | Admitting: Nurse Practitioner

## 2014-11-17 ENCOUNTER — Encounter: Payer: Self-pay | Admitting: Nurse Practitioner

## 2014-11-17 ENCOUNTER — Encounter: Payer: Medicaid Other | Admitting: Nurse Practitioner

## 2014-11-17 VITALS — BP 145/87 | HR 97 | Temp 98.5°F | Resp 18 | Ht 60.0 in | Wt 205.1 lb

## 2014-11-17 DIAGNOSIS — C50312 Malignant neoplasm of lower-inner quadrant of left female breast: Secondary | ICD-10-CM
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
## 2014-11-23 ENCOUNTER — Ambulatory Visit: Payer: Medicaid Other | Attending: General Surgery | Admitting: Physical Therapy

## 2014-12-07 ENCOUNTER — Encounter: Payer: Self-pay | Admitting: *Deleted

## 2014-12-07 NOTE — Progress Notes (Signed)
Received mammo report from [facility], sent to scan. 

## 2015-01-12 ENCOUNTER — Emergency Department (HOSPITAL_COMMUNITY): Payer: Medicaid Other

## 2015-01-12 ENCOUNTER — Encounter (HOSPITAL_COMMUNITY): Payer: Self-pay | Admitting: Nurse Practitioner

## 2015-01-12 ENCOUNTER — Inpatient Hospital Stay (HOSPITAL_COMMUNITY)
Admission: EM | Admit: 2015-01-12 | Discharge: 2015-01-15 | DRG: 175 | Disposition: A | Discharge status: Home / Self Care | Payer: Medicaid Other | Attending: Student in an Organized Health Care Education/Training Program | Admitting: Student in an Organized Health Care Education/Training Program

## 2015-01-12 DIAGNOSIS — K219 Gastro-esophageal reflux disease without esophagitis: Secondary | ICD-10-CM | POA: Diagnosis present

## 2015-01-12 DIAGNOSIS — I2609 Other pulmonary embolism with acute cor pulmonale: Secondary | ICD-10-CM | POA: Diagnosis present

## 2015-01-12 DIAGNOSIS — Z91040 Latex allergy status: Secondary | ICD-10-CM

## 2015-01-12 DIAGNOSIS — C50312 Malignant neoplasm of lower-inner quadrant of left female breast: Secondary | ICD-10-CM | POA: Diagnosis not present

## 2015-01-12 DIAGNOSIS — I82432 Acute embolism and thrombosis of left popliteal vein: Secondary | ICD-10-CM | POA: Diagnosis present

## 2015-01-12 DIAGNOSIS — Z923 Personal history of irradiation: Secondary | ICD-10-CM

## 2015-01-12 DIAGNOSIS — R0902 Hypoxemia: Secondary | ICD-10-CM

## 2015-01-12 DIAGNOSIS — Z87891 Personal history of nicotine dependence: Secondary | ICD-10-CM | POA: Diagnosis not present

## 2015-01-12 DIAGNOSIS — I82442 Acute embolism and thrombosis of left tibial vein: Secondary | ICD-10-CM | POA: Diagnosis present

## 2015-01-12 DIAGNOSIS — Z803 Family history of malignant neoplasm of breast: Secondary | ICD-10-CM | POA: Diagnosis not present

## 2015-01-12 DIAGNOSIS — I2699 Other pulmonary embolism without acute cor pulmonale: Secondary | ICD-10-CM

## 2015-01-12 DIAGNOSIS — Z23 Encounter for immunization: Secondary | ICD-10-CM | POA: Diagnosis not present

## 2015-01-12 DIAGNOSIS — Z79810 Long term (current) use of selective estrogen receptor modulators (SERMs): Secondary | ICD-10-CM | POA: Diagnosis not present

## 2015-01-12 DIAGNOSIS — R7303 Prediabetes: Secondary | ICD-10-CM | POA: Diagnosis present

## 2015-01-12 DIAGNOSIS — C50212 Malignant neoplasm of upper-inner quadrant of left female breast: Secondary | ICD-10-CM | POA: Diagnosis present

## 2015-01-12 LAB — CBC
HCT: 34.9 % — ABNORMAL LOW (ref 36.0–46.0)
Hemoglobin: 11.7 g/dL — ABNORMAL LOW (ref 12.0–15.0)
MCH: 29.6 pg (ref 26.0–34.0)
MCHC: 33.5 g/dL (ref 30.0–36.0)
MCV: 88.4 fL (ref 78.0–100.0)
PLATELETS: 184 10*3/uL (ref 150–400)
RBC: 3.95 MIL/uL (ref 3.87–5.11)
RDW: 13 % (ref 11.5–15.5)
WBC: 9.7 10*3/uL (ref 4.0–10.5)

## 2015-01-12 LAB — BASIC METABOLIC PANEL
Anion gap: 7 (ref 5–15)
BUN: 10 mg/dL (ref 6–20)
CHLORIDE: 108 mmol/L (ref 101–111)
CO2: 22 mmol/L (ref 22–32)
CREATININE: 0.95 mg/dL (ref 0.44–1.00)
Calcium: 8.9 mg/dL (ref 8.9–10.3)
GFR calc Af Amer: >60 mL/min (ref >60)
GFR calc non Af Amer: >60 mL/min (ref >60)
Glucose, Bld: 127 mg/dL — ABNORMAL HIGH (ref 65–99)
Potassium: 3.3 mmol/L — ABNORMAL LOW (ref 3.5–5.1)
Sodium: 137 mmol/L (ref 135–145)

## 2015-01-12 LAB — I-STAT TROPONIN, ED: Troponin i, poc: 0.03 ng/mL (ref 0.00–0.08)

## 2015-01-12 MED ORDER — HEPARIN BOLUS VIA INFUSION
5000.0000 [IU] | Freq: Once | INTRAVENOUS | Status: AC
  Administered 2015-01-12: 5000 [IU] via INTRAVENOUS
  Filled 2015-01-12: qty 5000

## 2015-01-12 MED ORDER — IOHEXOL 350 MG/ML SOLN
100.0000 mL | Freq: Once | INTRAVENOUS | Status: AC | PRN
  Administered 2015-01-12: 100 mL via INTRAVENOUS

## 2015-01-12 MED ORDER — HEPARIN (PORCINE) IN NACL 100-0.45 UNIT/ML-% IJ SOLN
1350.0000 [IU]/h | INTRAMUSCULAR | Status: DC
  Administered 2015-01-12: 1350 [IU]/h via INTRAVENOUS
  Filled 2015-01-12: qty 250
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
## 2015-01-12 NOTE — ED Notes (Signed)
Pt to CT
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
## 2015-01-12 NOTE — ED Notes (Addendum)
She c/o 1 week history sob, feeling tight in her chest and lightheadedness with exertion. shes had episodes of spotty, blurred vision that resolve. Denies fevers, cough, n/v, loc she is A&Ox4, resp e/u. Shes also noticed multiple similar episodes like this since starting tamoxifen post-lumpectomy.

## 2015-01-13 ENCOUNTER — Inpatient Hospital Stay (HOSPITAL_COMMUNITY): Payer: Medicaid Other

## 2015-01-13 ENCOUNTER — Encounter (HOSPITAL_COMMUNITY): Payer: Self-pay

## 2015-01-13 DIAGNOSIS — I2699 Other pulmonary embolism without acute cor pulmonale: Secondary | ICD-10-CM | POA: Diagnosis present

## 2015-01-13 LAB — CBC
HEMATOCRIT: 33.3 % — AB (ref 36.0–46.0)
Hemoglobin: 11.2 g/dL — ABNORMAL LOW (ref 12.0–15.0)
MCH: 29.7 pg (ref 26.0–34.0)
MCHC: 33.6 g/dL (ref 30.0–36.0)
MCV: 88.3 fL (ref 78.0–100.0)
Platelets: 173 10*3/uL (ref 150–400)
RBC: 3.77 MIL/uL — ABNORMAL LOW (ref 3.87–5.11)
RDW: 13 % (ref 11.5–15.5)
WBC: 10.4 10*3/uL (ref 4.0–10.5)

## 2015-01-13 LAB — HEPARIN LEVEL (UNFRACTIONATED)
HEPARIN UNFRACTIONATED: 0.76 [IU]/mL — AB (ref 0.30–0.70)
Heparin Unfractionated: 0.77 IU/mL — ABNORMAL HIGH (ref 0.30–0.70)

## 2015-01-13 LAB — TROPONIN I
Troponin I: 0.03 ng/mL (ref <0.031)
Troponin I: 0.08 ng/mL — ABNORMAL HIGH (ref <0.031)

## 2015-01-13 LAB — MRSA PCR SCREENING: MRSA BY PCR: NEGATIVE

## 2015-01-13 LAB — HIV ANTIBODY (ROUTINE TESTING W REFLEX): HIV SCREEN 4TH GENERATION: NONREACTIVE

## 2015-01-13 MED ORDER — ACETAMINOPHEN 325 MG PO TABS
650.0000 mg | ORAL_TABLET | Freq: Four times a day (QID) | ORAL | Status: DC | PRN
  Administered 2015-01-14 – 2015-01-15 (×3): 650 mg via ORAL
  Filled 2015-01-13 (×3): qty 2

## 2015-01-13 MED ORDER — ACETAMINOPHEN 650 MG RE SUPP: 650.0000 mg | Freq: Four times a day (QID) | RECTAL | Status: DC | PRN

## 2015-01-13 MED ORDER — TAMOXIFEN CITRATE 20 MG PO TABS
20.0000 mg | ORAL_TABLET | Freq: Every day | ORAL | Status: DC
  Filled 2015-01-13: qty 1

## 2015-01-13 MED ORDER — MORPHINE SULFATE (PF) 2 MG/ML IV SOLN: 1.0000 mg | INTRAVENOUS | Status: DC | PRN

## 2015-01-13 MED ORDER — ENOXAPARIN SODIUM 100 MG/ML ~~LOC~~ SOLN
90.0000 mg | Freq: Two times a day (BID) | SUBCUTANEOUS | Status: DC
  Administered 2015-01-13 – 2015-01-15 (×5): 90 mg via SUBCUTANEOUS
  Filled 2015-01-13 (×5): qty 1

## 2015-01-13 MED ORDER — POTASSIUM CHLORIDE CRYS ER 20 MEQ PO TBCR
40.0000 meq | EXTENDED_RELEASE_TABLET | Freq: Once | ORAL | Status: AC
  Administered 2015-01-13: 40 meq via ORAL
  Filled 2015-01-13: qty 2

## 2015-01-13 MED ORDER — PANTOPRAZOLE SODIUM 40 MG PO TBEC
40.0000 mg | DELAYED_RELEASE_TABLET | Freq: Every day | ORAL | Status: DC
  Administered 2015-01-13 – 2015-01-15 (×3): 40 mg via ORAL
  Filled 2015-01-13 (×3): qty 1

## 2015-01-13 NOTE — Progress Notes (Signed)
Subjective: Patient seen and examined this morning on rounds.  No acute events overnight.  States she is not short of breath at rest but does have dyspnea on minimal exertion.  No other complaints.  Objective: Vital signs in last 24 hours: Filed Vitals:   01/13/15 0014 01/13/15 0359 01/13/15 0400 01/13/15 0800  BP: 144/99  134/102 124/87  Pulse: 97  96 96  Temp: 98.8 F (37.1 C) 99.6 F (37.6 C)  99.3 F (37.4 C)  TempSrc: Oral Oral  Oral  Resp: 11  17 20   Height: 5\' 4"  (1.626 m)     Weight: 204 lb 2.3 oz (92.6 kg)     SpO2: 97%  97% 99%   Weight change:   Intake/Output Summary (Last 24 hours) at 01/13/15 0942 Last data filed at 01/13/15 0400  Gross per 24 hour  Intake     54 ml  Output    800 ml  Net   -746 ml   General: resting in bed, no distress, receiving supplemental O2 via [place] HEENT: EOMI, no scleral icterus Cardiac: RRR, no rubs, murmurs or gallops Pulm: clear to auscultation bilaterally, moving normal volumes of air, no signs of labored breathing Abd: soft, nontender, nondistended, BS present Ext: warm and well perfused, no pedal edema, no calf tenderness Neuro: alert and oriented X3, no focal deficits  Lab Results: Basic Metabolic Panel:  Recent Labs Lab 01/12/15 1747  NA 137  K 3.3*  CL 108  CO2 22  GLUCOSE 127*  BUN 10  CREATININE 0.95  CALCIUM 8.9   Liver Function Tests: No results for input(s): AST, ALT, ALKPHOS, BILITOT, PROT, ALBUMIN in the last 168 hours. No results for input(s): LIPASE, AMYLASE in the last 168 hours. No results for input(s): AMMONIA in the last 168 hours. CBC:  Recent Labs Lab 01/12/15 1747 01/13/15 0242  WBC 9.7 10.4  HGB 11.7* 11.2*  HCT 34.9* 33.3*  MCV 88.4 88.3  PLT 184 173   Cardiac Enzymes:  Recent Labs Lab 01/13/15 0057 01/13/15 0601  TROPONINI 0.03 0.08*   BNP: No results for input(s): PROBNP in the last 168 hours. D-Dimer: No results for input(s): DDIMER in the last 168 hours. CBG: No  results for input(s): GLUCAP in the last 168 hours. Hemoglobin A1C: No results for input(s): HGBA1C in the last 168 hours. Fasting Lipid Panel: No results for input(s): CHOL, HDL, LDLCALC, TRIG, CHOLHDL, LDLDIRECT in the last 168 hours. Thyroid Function Tests: No results for input(s): TSH, T4TOTAL, FREET4, T3FREE, THYROIDAB in the last 168 hours. Coagulation: No results for input(s): LABPROT, INR in the last 168 hours. Anemia Panel: No results for input(s): VITAMINB12, FOLATE, FERRITIN, TIBC, IRON, RETICCTPCT in the last 168 hours. Urine Drug Screen: Drugs of Abuse  No results found for: LABOPIA, COCAINSCRNUR, LABBENZ, AMPHETMU, THCU, LABBARB  Alcohol Level: No results for input(s): ETH in the last 168 hours. Urinalysis: No results for input(s): COLORURINE, LABSPEC, PHURINE, GLUCOSEU, HGBUR, BILIRUBINUR, KETONESUR, PROTEINUR, UROBILINOGEN, NITRITE, LEUKOCYTESUR in the last 168 hours.  Invalid input(s): APPERANCEUR   Micro Results: Recent Results (from the past 240 hour(s))  MRSA PCR Screening     Status: None   Collection Time: 01/13/15 12:13 AM  Result Value Ref Range Status   MRSA by PCR NEGATIVE NEGATIVE Final    Comment:        The GeneXpert MRSA Assay (FDA approved for NASAL specimens only), is one component of a comprehensive MRSA colonization surveillance program. It is not intended to diagnose  MRSA infection nor to guide or monitor treatment for MRSA infections.    Studies/Results: Dg Chest 2 View  01/12/2015  CLINICAL DATA:  Shortness breath with chest pain and lightheadedness. History of pre diabetes. EXAM: CHEST  2 VIEW COMPARISON:  12/26/2007 radiographs. FINDINGS: Mild interval increase in the heart size, likely in part secondary to lower lung volumes. The heart size is at the upper limits of normal. Bilateral hilar prominence appears vascular. The lungs are clear. There is no pleural effusion or pneumothorax. There are surgical clips within the left breast and  left axilla. No worrisome osseous findings. IMPRESSION: No acute cardiopulmonary process identified. Lower lung volumes with interval increase in the heart size, now at the upper limits of normal. Electronically Signed   By: Richardean Sale M.D.   On: 01/12/2015 18:12   Ct Angio Chest Pe W/cm &/or Wo Cm  01/12/2015  CLINICAL DATA:  Progressive chest pain and shortness of breath for past 5 days. Breast carcinoma. EXAM: CT ANGIOGRAPHY CHEST WITH CONTRAST TECHNIQUE: Multidetector CT imaging of the chest was performed using the standard protocol during bolus administration of intravenous contrast. Multiplanar CT image reconstructions and MIPs were obtained to evaluate the vascular anatomy. CONTRAST:  139mL OMNIPAQUE IOHEXOL 350 MG/ML SOLN COMPARISON:  None. FINDINGS: Mediastinum/Lymph Nodes: Acute pulmonary embolism is seen in the distal right and left pulmonary arteries and all lobar branches. RV/LV ratio equals 1 .38, consistent with right heart strain. Abnormal soft tissue density is seen within the anterior mediastinum which has a somewhat triangular configuration. This appearance favors rebound thymic hyperplasia, however mediastinal lymphadenopathy cannot definitely be excluded. No axillary lymphadenopathy identified. Surgical clips seen in left breast and axilla. Lungs/Pleura: No pulmonary mass, infiltrate, or effusion. Mild radiation changes seen within lingula. Upper abdomen: No acute findings. Musculoskeletal: No chest wall mass or suspicious bone lesions identified. Review of the MIP images confirms the above findings. IMPRESSION: Positive for acute PE with CT evidence of right heart strain (RV/LV Ratio = 1.38) consistent with at least submassive (intermediate risk) PE. The presence of right heart strain has been associated with an increased risk of morbidity and mortality. Please activate Code PE by paging [phone]. Abnormal anterior mediastinal soft tissue density. The appearance favors rebound  thymic hyperplasia, however mediastinal lymphadenopathy cannot definitely be excluded. Critical Value/emergent results were called by telephone at the time of interpretation on 01/12/2015 at 9:48 pm to Dr. Daleen Bo , who verbally acknowledged these results. Electronically Signed   By: Earle Gell M.D.   On: 01/12/2015 21:51   Medications:  Scheduled Meds: . enoxaparin (LOVENOX) injection  90 mg Subcutaneous Q12H  . pantoprazole  40 mg Oral Daily   Continuous Infusions:  PRN Meds:.acetaminophen **OR** acetaminophen, morphine injection Assessment/Plan: Principal Problem:   Acute pulmonary embolism (HCC) Active Problems:   Cancer of lower-inner quadrant of left female breast ([place])   Acute Bilateral Submassive PE:  Patient presents with reported 6 days of worsening chest tightness, SOB, and lightheadedness.  All these symptoms were on exertion.  However, patient did report DOE as far back as 11/17/14 per chart review.  She denies any recent long travel, period of immobilization, current smoking, recent surgery, OCP use, or history of spontaneous abortions.  She was treated this year for Stage 1 breast cancer s/p lumpectomy and radiation.  Currently she is on Tamoxifen.  According to one [place] study Jerilee Hoh, [place], et al. Cancer. 2009), it is suggested that Tamoxifen therapy may increase the risk for DVT/PE and that  the first 2 years after initiation of therapy may be the most crucial time for monitoring.  - Discontinue Heparin drip - Start Lovenox BID per pharmacy with potential transition to [place] at discharge - Supplemental O2 - Ambulate patient with O2 monitoring to determine home O2 needs given her clot burden - ECHO today - Monitor on telemetry in Step Down - Hold Tamoxifen  Stage 1a Breast Cancer:  Patient followed by Dr. Lindi Adie.  Currently on Tamoxifen daily prior to admission.  She recently completed lumpectomy and radiation.  Her CT showed abnormal anterior mediastinal soft tissue  density. The appearance favors rebound thymic hyperplasia, however mediastinal lymphadenopathy cannot definitely be excluded.  Will note this at discharge summary for her follow up with Dr. Lindi Adie. - Hold Tamoxifen as above   FEN Fluids: none Electrolytes: replete prn Diet: Heart healthy  Dispo: Disposition is deferred at this time, awaiting improvement of current medical problems.    The patient does have a current PCP Abby Potash, PA-C) and does not need an [facility] hospital follow-up appointment after discharge.  The patient does not have transportation limitations that hinder transportation to clinic appointments.  .Services Needed at time of discharge: Y = Yes, Blank = No PT:   OT:   RN:   Equipment:   Other:     LOS: 1 day   Jule Ser, DO 01/13/2015, 9:42 AM

## 2015-01-13 NOTE — Progress Notes (Signed)
Pt ambulated in the hall, pt HR in 130's, resp rate 40', and O2 sat 89 on room air while ambulating, pt endorses "im having a hard time catching my breath". Will continue to monitor.

## 2015-01-13 NOTE — Progress Notes (Signed)
ANTICOAGULATION CONSULT NOTE - Follow Up Consult  Pharmacy Consult for heparin Indication: pulmonary embolus   Labs:  Recent Labs  01/12/15 1747 01/13/15 0057 01/13/15 0242  HGB 11.7*  --  11.2*  HCT 34.9*  --  33.3*  PLT 184  --  173  HEPARINUNFRC  --   --  0.76*  CREATININE 0.95  --   --   TROPONINI  --  0.03  --     Assessment/Plan:  39yo female slightly supratherapeutic on heparin with initial dosing for PE though lab was drawn just 4hr after large bolus, suspect level will trend down.  Will continue gtt at current rate for now and check level w/ next lab draw.  Wynona Neat, PharmD, BCPS  01/13/2015,4:15 AM

## 2015-01-13 NOTE — Progress Notes (Signed)
*  PRELIMINARY RESULTS* Vascular Ultrasound Lower extremity venous duplex has been completed.  Preliminary findings: DVT noted in the left popliteal vein and proximal left posterior tibial veins. No DVT RLE.   Landry Mellow, RDMS, RVT  01/13/2015, 3:42 PM

## 2015-01-13 NOTE — Progress Notes (Signed)
ANTICOAGULATION CONSULT NOTE - Follow Up Consult  Pharmacy Consult for Lovenox Indication: pulmonary embolus  Allergies  Allergen Reactions  . Latex Hives    Patient Measurements: Height: 5\' 4"  (162.6 cm) Weight: 204 lb 2.3 oz (92.6 kg) IBW/kg (Calculated) : 54.7  Vital Signs: Temp: 99.6 F (37.6 C) (11/19 0359) Temp Source: Oral (11/19 0359) BP: 134/102 mmHg (11/19 0400) Pulse Rate: 96 (11/19 0400)  Labs:  Recent Labs  01/12/15 1747 01/13/15 0057 01/13/15 0242 01/13/15 0601  HGB 11.7*  --  11.2*  --   HCT 34.9*  --  33.3*  --   PLT 184  --  173  --   HEPARINUNFRC  --   --  0.76*  --   CREATININE 0.95  --   --   --   TROPONINI  --  0.03  --  0.08*    Estimated Creatinine Clearance: 89.5 mL/min (by C-G formula based on Cr of 0.95).  Assessment: 39 yo f with a history of breast CA who presented to the ED last night with SOB and chest tightness.  Pharmacy is consulted to switch from heparin infusion to Lovenox for bilateral submassive PEs. Hgb 11.2, plts 173, SCr 0.95, CrCl ~ 90 ml/min.  Goal of Therapy:  Anti-Xa level 0.6-1 units/ml 4hrs after LMWH dose given Monitor platelets by anticoagulation protocol: Yes   Plan:  Discontinue heparin infusion Start Lovenox 90 mg SQ q12h Monitor for s/sx of bleeding  Sabriya Yono L. Nicole Kindred, PharmD Clinical Pharmacy Resident Pager: [phone] 01/13/2015 7:26 AM

## 2015-01-13 NOTE — Progress Notes (Signed)
PULMONARY / CRITICAL CARE MEDICINE CONSULT NOTE   Name: Janice Crawford MRN: [id number] DOB: 1975-10-21    ADMISSION DATE:  01/12/2015 CONSULTATION DATE:  01/12/15  REFERRING MD :  Dr. Eulis Crawford, EDP   CHIEF COMPLAINT:  SOB, DOE, chest tightness, PE  INITIAL PRESENTATION: . We is a 39 y/o with a hx of breast cancer ( s/p lumpectomy /radiation 03/2014 )  on tamoxifen presenting to the [facility] ED with SOB, DOE, and chest tightness for approximately 1 week. She was found to have submassive PEs on CTA with evidence of right heart strain. She was started on a heparin drip and PCCM was consulted.    STUDIES:  CXR 11/18>> negative  CTA 11/18 >> Acute submassive PEs in the distal right and left pulm arteries with RV/LV ratio of 1.38 concerning for right heart strain.  SIGNIFICANT EVENTS: 11/18: admitted with bilateral submassive PEs with evidence of right heart strain.    SUBJECTIVE:  Resting in bed, less dyspnea.  O2 sats nml   VITAL SIGNS: Temp:  [98.6 F (37 C)-99.6 F (37.6 C)] 99.3 F (37.4 C) (11/19 0800) Pulse Rate:  [96-106] 96 (11/19 0800) Resp:  [11-24] 20 (11/19 0800) BP: (124-162)/(86-117) 124/87 mmHg (11/19 0800) SpO2:  [93 %-100 %] 99 % (11/19 0800) Weight:  [92.6 kg (204 lb 2.3 oz)-92.67 kg (204 lb 4.8 oz)] 92.6 kg (204 lb 2.3 oz) (11/19 0014) HEMODYNAMICS:   VENTILATOR SETTINGS:   INTAKE / OUTPUT:  Intake/Output Summary (Last 24 hours) at 01/13/15 1055 Last data filed at 01/13/15 0400  Gross per 24 hour  Intake     54 ml  Output    800 ml  Net   -746 ml    PHYSICAL EXAMINATION: General: Lying in bed in NAD. Non-toxic Eyes: Conjunctivae non-injected.  ENTM: Moist mucous membranes. Oropharynx clear. No nasal discharge.  Neck: Supple, no LAD. No JVD noted.  Cardiovascular: 100s, regular rhythm. No murmurs, rubs, or gallops noted. No pitting edema noted. Respiratory: No increased WOB. CTAB without wheezing, rhonchi, or crackles noted.   Abdomen:  +BS, soft, non-distended, non-tender.  MSK: Normal bulk and noted. No gross deformities noted. No calf size discrepancy, no cords palpated. Negative Homan's sign.   Skin: No rashes noted  Neuro: A&O x4. No gross neurologic deficits  Psych:  Appropriate mood and affect.   LABS:  CBC  Recent Labs Lab 01/12/15 1747 01/13/15 0242  WBC 9.7 10.4  HGB 11.7* 11.2*  HCT 34.9* 33.3*  PLT 184 173   Coag's No results for input(s): APTT, INR in the last 168 hours. BMET  Recent Labs Lab 01/12/15 1747  NA 137  K 3.3*  CL 108  CO2 22  BUN 10  CREATININE 0.95  GLUCOSE 127*   Electrolytes  Recent Labs Lab 01/12/15 1747  CALCIUM 8.9   Sepsis Markers No results for input(s): LATICACIDVEN, PROCALCITON, O2SATVEN in the last 168 hours. ABG No results for input(s): PHART, PCO2ART, PO2ART in the last 168 hours. Liver Enzymes No results for input(s): AST, ALT, ALKPHOS, BILITOT, ALBUMIN in the last 168 hours. Cardiac Enzymes  Recent Labs Lab 01/13/15 0057 01/13/15 0601  TROPONINI 0.03 0.08*   Glucose No results for input(s): GLUCAP in the last 168 hours.  Imaging Dg Chest 2 View  01/12/2015  CLINICAL DATA:  Shortness breath with chest pain and lightheadedness. History of pre diabetes. EXAM: CHEST  2 VIEW COMPARISON:  12/26/2007 radiographs. FINDINGS: Mild interval increase in the heart size, likely in part secondary  to lower lung volumes. The heart size is at the upper limits of normal. Bilateral hilar prominence appears vascular. The lungs are clear. There is no pleural effusion or pneumothorax. There are surgical clips within the left breast and left axilla. No worrisome osseous findings. IMPRESSION: No acute cardiopulmonary process identified. Lower lung volumes with interval increase in the heart size, now at the upper limits of normal. Electronically Signed   By: Janice Crawford M.D.   On: 01/12/2015 18:12   Ct Angio Chest Pe W/cm &/or Wo Cm  01/12/2015  CLINICAL DATA:   Progressive chest pain and shortness of breath for past 5 days. Breast carcinoma. EXAM: CT ANGIOGRAPHY CHEST WITH CONTRAST TECHNIQUE: Multidetector CT imaging of the chest was performed using the standard protocol during bolus administration of intravenous contrast. Multiplanar CT image reconstructions and MIPs were obtained to evaluate the vascular anatomy. CONTRAST:  168mL OMNIPAQUE IOHEXOL 350 MG/ML SOLN COMPARISON:  None. FINDINGS: Mediastinum/Lymph Nodes: Acute pulmonary embolism is seen in the distal right and left pulmonary arteries and all lobar branches. RV/LV ratio equals 1 .38, consistent with right heart strain. Abnormal soft tissue density is seen within the anterior mediastinum which has a somewhat triangular configuration. This appearance favors rebound thymic hyperplasia, however mediastinal lymphadenopathy cannot definitely be excluded. No axillary lymphadenopathy identified. Surgical clips seen in left breast and axilla. Lungs/Pleura: No pulmonary mass, infiltrate, or effusion. Mild radiation changes seen within lingula. Upper abdomen: No acute findings. Musculoskeletal: No chest wall mass or suspicious bone lesions identified. Review of the MIP images confirms the above findings. IMPRESSION: Positive for acute PE with CT evidence of right heart strain (RV/LV Ratio = 1.38) consistent with at least submassive (intermediate risk) PE. The presence of right heart strain has been associated with an increased risk of morbidity and mortality. Please activate Code PE by paging [phone]. Abnormal anterior mediastinal soft tissue density. The appearance favors rebound thymic hyperplasia, however mediastinal lymphadenopathy cannot definitely be excluded. Critical Value/emergent results were called by telephone at the time of interpretation on 01/12/2015 at 9:48 pm to Dr. Daleen Crawford , who verbally acknowledged these results. Electronically Signed   By: Janice Crawford M.D.   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 She is hemodynamically stable . Has transitioned from Hep infusion to therapeutic LMWH.  - Continue lovenox  - Monitor for BP instability or increased O2 requirement  - Supplemental O2 as needed - Lower extremity venous dopplers pending  - Echo pending  - Once out of the acute phase, could consider hypercoagulable work-up; discussed this with patient, her sister, and her daughter -on tamoxifen -increase VTE risk    Tammy Parrett NP-C  [facility]  [phone]

## 2015-01-13 NOTE — Progress Notes (Signed)
  Echocardiogram 2D Echocardiogram has been performed.  Janice Crawford 01/13/2015, 12:45 PM

## 2015-01-14 DIAGNOSIS — C50312 Malignant neoplasm of lower-inner quadrant of left female breast: Secondary | ICD-10-CM

## 2015-01-14 LAB — BASIC METABOLIC PANEL
Anion gap: 9 (ref 5–15)
BUN: 12 mg/dL (ref 6–20)
CHLORIDE: 109 mmol/L (ref 101–111)
CO2: 20 mmol/L — ABNORMAL LOW (ref 22–32)
Calcium: 8.6 mg/dL — ABNORMAL LOW (ref 8.9–10.3)
Creatinine, Ser: 0.89 mg/dL (ref 0.44–1.00)
GFR calc Af Amer: >60 mL/min (ref >60)
GFR calc non Af Amer: >60 mL/min (ref >60)
GLUCOSE: 102 mg/dL — AB (ref 65–99)
POTASSIUM: 3.7 mmol/L (ref 3.5–5.1)
SODIUM: 138 mmol/L (ref 135–145)

## 2015-01-14 LAB — CBC
HCT: 34.5 % — ABNORMAL LOW (ref 36.0–46.0)
HEMOGLOBIN: 12.1 g/dL (ref 12.0–15.0)
MCH: 30.5 pg (ref 26.0–34.0)
MCHC: 35.1 g/dL (ref 30.0–36.0)
MCV: 86.9 fL (ref 78.0–100.0)
Platelets: 183 10*3/uL (ref 150–400)
RBC: 3.97 MIL/uL (ref 3.87–5.11)
RDW: 13 % (ref 11.5–15.5)
WBC: 11.3 10*3/uL — ABNORMAL HIGH (ref 4.0–10.5)

## 2015-01-14 MED ORDER — INFLUENZA VAC SPLIT QUAD 0.5 ML IM SUSY
0.5000 mL | PREFILLED_SYRINGE | INTRAMUSCULAR | Status: AC
  Administered 2015-01-15: 0.5 mL via INTRAMUSCULAR
  Filled 2015-01-14: qty 0.5

## 2015-01-14 NOTE — Progress Notes (Signed)
Subjective: No acute events overnight. Patient reports her breathing has improved. She currently does not need oxygen while laying in bed, but does note getting SOB even if walking to the bathroom. She reports some pleuritic chest pain last night that was relieved by pain medication. She is motivated to walk this morning.   Objective: Vital signs in last 24 hours: Filed Vitals:   01/14/15 0344 01/14/15 0353 01/14/15 0400 01/14/15 0800  BP:   111/82 108/76  Pulse:  102 100 88  Temp: 99.4 F (37.4 C)   98.9 F (37.2 C)  TempSrc: Oral   Oral  Resp:  22 22 17   Height:      Weight:      SpO2:  91% 92% 92%   Weight change:   Intake/Output Summary (Last 24 hours) at 01/14/15 1011 Last data filed at 01/14/15 0330  Gross per 24 hour  Intake    600 ml  Output   1075 ml  Net   -475 ml   General: resting in bed, NAD HEENT: [place]/AT, EOMI, no scleral icterus, mucus membranes moist Cardiac: RRR, no m/g/r Pulm: CTA bilaterally, breathing comfortably on room air  Abd: BS+, soft, nontender, nondistended Ext: warm and well perfused, no pedal edema, no calf tenderness Neuro: alert and oriented X3, no focal deficits  Lab Results: Basic Metabolic Panel:  Recent Labs Lab 01/12/15 1747 01/14/15 0400  NA 137 138  K 3.3* 3.7  CL 108 109  CO2 22 20*  GLUCOSE 127* 102*  BUN 10 12  CREATININE 0.95 0.89  CALCIUM 8.9 8.6*   CBC:  Recent Labs Lab 01/13/15 0242 01/14/15 0400  WBC 10.4 11.3*  HGB 11.2* 12.1  HCT 33.3* 34.5*  MCV 88.3 86.9  PLT 173 183   Cardiac Enzymes:  Recent Labs Lab 01/13/15 0057 01/13/15 0601  TROPONINI 0.03 0.08*    Micro Results: Recent Results (from the past 240 hour(s))  MRSA PCR Screening     Status: None   Collection Time: 01/13/15 12:13 AM  Result Value Ref Range Status   MRSA by PCR NEGATIVE NEGATIVE Final    Comment:        The GeneXpert MRSA Assay (FDA approved for NASAL specimens only), is one component of a comprehensive MRSA  colonization surveillance program. It is not intended to diagnose MRSA infection nor to guide or monitor treatment for MRSA infections.    Studies/Results: Dg Chest 2 View  01/12/2015  CLINICAL DATA:  Shortness breath with chest pain and lightheadedness. History of pre diabetes. EXAM: CHEST  2 VIEW COMPARISON:  12/26/2007 radiographs. FINDINGS: Mild interval increase in the heart size, likely in part secondary to lower lung volumes. The heart size is at the upper limits of normal. Bilateral hilar prominence appears vascular. The lungs are clear. There is no pleural effusion or pneumothorax. There are surgical clips within the left breast and left axilla. No worrisome osseous findings. IMPRESSION: No acute cardiopulmonary process identified. Lower lung volumes with interval increase in the heart size, now at the upper limits of normal. Electronically Signed   By: Richardean Sale M.D.   On: 01/12/2015 18:12   Ct Angio Chest Pe W/cm &/or Wo Cm  01/12/2015  CLINICAL DATA:  Progressive chest pain and shortness of breath for past 5 days. Breast carcinoma. EXAM: CT ANGIOGRAPHY CHEST WITH CONTRAST TECHNIQUE: Multidetector CT imaging of the chest was performed using the standard protocol during bolus administration of intravenous contrast. Multiplanar CT image reconstructions and MIPs were  obtained to evaluate the vascular anatomy. CONTRAST:  152mL OMNIPAQUE IOHEXOL 350 MG/ML SOLN COMPARISON:  None. FINDINGS: Mediastinum/Lymph Nodes: Acute pulmonary embolism is seen in the distal right and left pulmonary arteries and all lobar branches. RV/LV ratio equals 1 .38, consistent with right heart strain. Abnormal soft tissue density is seen within the anterior mediastinum which has a somewhat triangular configuration. This appearance favors rebound thymic hyperplasia, however mediastinal lymphadenopathy cannot definitely be excluded. No axillary lymphadenopathy identified. Surgical clips seen in left breast and  axilla. Lungs/Pleura: No pulmonary mass, infiltrate, or effusion. Mild radiation changes seen within lingula. Upper abdomen: No acute findings. Musculoskeletal: No chest wall mass or suspicious bone lesions identified. Review of the MIP images confirms the above findings. IMPRESSION: Positive for acute PE with CT evidence of right heart strain (RV/LV Ratio = 1.38) consistent with at least submassive (intermediate risk) PE. The presence of right heart strain has been associated with an increased risk of morbidity and mortality. Please activate Code PE by paging [phone]. Abnormal anterior mediastinal soft tissue density. The appearance favors rebound thymic hyperplasia, however mediastinal lymphadenopathy cannot definitely be excluded. Critical Value/emergent results were called by telephone at the time of interpretation on 01/12/2015 at 9:48 pm to Dr. Daleen Bo , who verbally acknowledged these results. Electronically Signed   By: Earle Gell M.D.   On: 01/12/2015 21:51   Medications:  Scheduled Meds: . enoxaparin (LOVENOX) injection  90 mg Subcutaneous Q12H  . pantoprazole  40 mg Oral Daily   Continuous Infusions:  PRN Meds:.acetaminophen **OR** acetaminophen, morphine injection Assessment/Plan:  Acute Bilateral Submassive PE:  In setting of recently treated Stage 1 breast cancer s/p lumpectomy and radiation and currently on Tamoxifen. Tamoxifen has been discontinued as it acts as a procoagulant. LE dopplers showed a DVT in the left popliteal vein and proximal left posterior tibial veins. Echo showed normal LV systolic function, grade 1 diastolic dysfunction, septal flattening suggestive of pulmonary HTN, and mildly reduced RV function. She will need to be on anticoagulation indefinitely. Plan is to continue Lovenox inpatient and transition to Xarelto upon discharge.  - Continue Lovenox 90 mg BID  - Continue supplemental O2 - Ambulate patient with O2 monitoring to determine home O2 needs given  her clot burden - Monitor on telemetry in Step Down - Hold Tamoxifen  Stage 1a Breast Cancer:  Patient followed by Dr. Lindi Adie.  Currently on Tamoxifen daily prior to admission.  She recently completed lumpectomy and radiation.  Her CT showed abnormal anterior mediastinal soft tissue density. The appearance favors rebound thymic hyperplasia, however mediastinal lymphadenopathy cannot definitely be excluded.  Will note this at discharge summary for her follow up with Dr. Lindi Adie. - Hold Tamoxifen as above    Diet: Heart healthy VTE: Therapeutic Lovenox  Dispo: Discharge possibly tomorrow    The patient does have a current PCP Abby Potash, PA-C) and does not need an [facility] hospital follow-up appointment after discharge.  The patient does not have transportation limitations that hinder transportation to clinic appointments.  .Services Needed at time of discharge: Y = Yes, Blank = No PT:   OT:   RN:   Equipment:   Other:     LOS: 2 days  Albin Felling, MD, MPH Internal Medicine Resident, PGY-II Pager: [phone] 01/14/2015, 10:11 AM

## 2015-01-14 NOTE — Progress Notes (Signed)
Utilization Review Completed.Janice Crawford T11/20/2016  

## 2015-01-14 NOTE — Progress Notes (Signed)
Pt ambulated in hall with RN per pt request; tolerated well with some complaints of mild SHOB; no SHOB symptoms noted; RA with oxyged sats 98% on return to pt room; pt complains of intermittent SHOB when standing at bathroom sink to "wash up"; chair placed in bathroom per pt request; will continue to closely monitor

## 2015-01-14 NOTE — Progress Notes (Signed)
01/14/2015 7:57 PM Walked on room air 400 ft. No increased SOB. O2 sat prior was 98-100% with HR 99-102. O2 sat during the walk was 97-98% with HR 101-102.

## 2015-01-15 ENCOUNTER — Other Ambulatory Visit: Payer: Self-pay | Admitting: *Deleted

## 2015-01-15 ENCOUNTER — Telehealth: Payer: Self-pay | Admitting: Hematology and Oncology

## 2015-01-15 LAB — CBC
HCT: 33.9 % — ABNORMAL LOW (ref 36.0–46.0)
Hemoglobin: 11.5 g/dL — ABNORMAL LOW (ref 12.0–15.0)
MCH: 29.8 pg (ref 26.0–34.0)
MCHC: 33.9 g/dL (ref 30.0–36.0)
MCV: 87.8 fL (ref 78.0–100.0)
PLATELETS: 182 10*3/uL (ref 150–400)
RBC: 3.86 MIL/uL — AB (ref 3.87–5.11)
RDW: 13 % (ref 11.5–15.5)
WBC: 8.7 10*3/uL (ref 4.0–10.5)

## 2015-01-15 MED ORDER — RIVAROXABAN (XARELTO) VTE STARTER PACK (15 & 20 MG): ORAL_TABLET | ORAL | Status: DC

## 2015-01-15 NOTE — Discharge Instructions (Signed)
Information on my medicine - XARELTO (rivaroxaban)  This medication education was reviewed with me or my healthcare representative as part of my discharge preparation.  The pharmacist that spoke with me during my hospital stay was:  Wayland Salinas, [place]? Xarelto was prescribed to treat blood clots that may have been found in the veins of your legs (deep vein thrombosis) or in your lungs (pulmonary embolism) and to reduce the risk of them occurring again.  What do you need to know about Xarelto? The starting dose is one 15 mg tablet taken TWICE daily with food for the FIRST 21 DAYS then on (enter date)  12/12  the dose is changed to one 20 mg tablet taken ONCE A DAY with your evening meal.  DO NOT stop taking Xarelto without talking to the health care provider who prescribed the medication.  Refill your prescription for 20 mg tablets before you run out.  After discharge, you should have regular check-up appointments with your healthcare provider that is prescribing your Xarelto.  In the future your dose may need to be changed if your kidney function changes by a significant amount.  What do you do if you miss a dose? If you are taking Xarelto TWICE DAILY and you miss a dose, take it as soon as you remember. You may take two 15 mg tablets (total 30 mg) at the same time then resume your regularly scheduled 15 mg twice daily the next day.  If you are taking Xarelto ONCE DAILY and you miss a dose, take it as soon as you remember on the same day then continue your regularly scheduled once daily regimen the next day. Do not take two doses of Xarelto at the same time.   Important Safety Information Xarelto is a blood thinner medicine that can cause bleeding. You should call your healthcare provider right away if you experience any of the following: ? Bleeding from an injury or your nose that does not stop. ? Unusual colored urine (red or dark  brown) or unusual colored stools (red or black). ? Unusual bruising for unknown reasons. ? A serious fall or if you hit your head (even if there is no bleeding).  Some medicines may interact with Xarelto and might increase your risk of bleeding while on Xarelto. To help avoid this, consult your healthcare provider or pharmacist prior to using any new prescription or non-prescription medications, including herbals, vitamins, non-steroidal anti-inflammatory drugs (NSAIDs) and supplements.  This website has more information on Xarelto: [email or web address]  Your prescription for Xarelto has been sent to the pharmacy.  In addition to starting Xarelto for your blood clots, please stop taking Tamoxifen.  At your follow up visit with Dr. Lindi Adie, please discuss with him if he wants to restart this medication or start a new medicine.

## 2015-01-15 NOTE — Progress Notes (Signed)
Subjective: Patient seen and examined this morning.  No acute events overnight.  Patient reports breathing improved.  Still states has some SOB with exertion, but overall better.  Ambulated last night 400 feet on room air.  O2 sats before and during ambulation were greater than 97%.  Objective: Vital signs in last 24 hours: Filed Vitals:   01/15/15 0359 01/15/15 0400 01/15/15 0700 01/15/15 0754  BP:  113/81  132/90  Pulse:  93 89 85  Temp: 98.5 F (36.9 C)   98.5 F (36.9 C)  TempSrc: Oral   Oral  Resp:  17 18 21   Height:      Weight:      SpO2:  100% 94% 97%   Weight change:   Intake/Output Summary (Last 24 hours) at 01/15/15 0930 Last data filed at 01/14/15 1838  Gross per 24 hour  Intake    520 ml  Output    600 ml  Net    -80 ml   General: lying in bed, no distress, not on oxygen HEENT: normocephalic, atraumatic. EOMI, no scleral icterus, mucus membranes moist Cardiac: RRR, normal S1, S2 Pulm: CTA bilaterally, breathing on room air without increased work of breathing Abd: soft, nontender, nondistended Ext: warm and well perfused, no pedal edema, no calf tenderness Neuro: alert and oriented X3, no focal deficits  Lab Results: Basic Metabolic Panel:  Recent Labs Lab 01/12/15 1747 01/14/15 0400  NA 137 138  K 3.3* 3.7  CL 108 109  CO2 22 20*  GLUCOSE 127* 102*  BUN 10 12  CREATININE 0.95 0.89  CALCIUM 8.9 8.6*   CBC:  Recent Labs Lab 01/14/15 0400 01/15/15 0326  WBC 11.3* 8.7  HGB 12.1 11.5*  HCT 34.5* 33.9*  MCV 86.9 87.8  PLT 183 182   Cardiac Enzymes:  Recent Labs Lab 01/13/15 0057 01/13/15 0601  TROPONINI 0.03 0.08*    Micro Results: Recent Results (from the past 240 hour(s))  MRSA PCR Screening     Status: None   Collection Time: 01/13/15 12:13 AM  Result Value Ref Range Status   MRSA by PCR NEGATIVE NEGATIVE Final    Comment:        The GeneXpert MRSA Assay (FDA approved for NASAL specimens only), is one component of  a comprehensive MRSA colonization surveillance program. It is not intended to diagnose MRSA infection nor to guide or monitor treatment for MRSA infections.    Studies/Results: No results found. Medications:  Scheduled Meds: . enoxaparin (LOVENOX) injection  90 mg Subcutaneous Q12H  . Influenza vac split quadrivalent PF  0.5 mL Intramuscular Tomorrow-1000  . pantoprazole  40 mg Oral Daily   Continuous Infusions:  PRN Meds:.acetaminophen **OR** acetaminophen, morphine injection Assessment/Plan:  Acute Bilateral Submassive PE:  In setting of recently treated Stage 1 breast cancer s/p lumpectomy and radiation and currently on Tamoxifen prior to admission. Tamoxifen has been discontinued as it acts as a procoagulant.  - LE dopplers on 11/19 showed a DVT in the left popliteal vein and proximal left posterior tibial veins. Echo on 11/19 showed normal LV systolic function, grade 1 diastolic dysfunction, septal flattening suggestive of pulmonary HTN, and mildly reduced RV function. She will need to be on anticoagulation indefinitely.  - Discharge today with transition to Xarelto 15mg  BID x 21 days, then 20mg  daily after - No need for home oxygen with maintaining O2 sats greater than 97% while ambulating on room air - Hold Tamoxifen at discharge and will have patient discuss with  Dr. Lindi Adie moving forward  Stage 1a Breast Cancer:  Patient followed by Dr. Lindi Adie.  Currently on Tamoxifen daily prior to admission.  She recently completed lumpectomy and radiation.  Her CT showed abnormal anterior mediastinal soft tissue density. The appearance favors rebound thymic hyperplasia, however mediastinal lymphadenopathy cannot definitely be excluded.  Will note this at discharge summary for her follow up with Dr. Lindi Adie. - Hold Tamoxifen as above   Diet: Heart healthy  DVT PPx: Lovenox while inpatient --> discharging home on Xarelto  Dispo: Discharge today with close outpatient follow up with PCP and  Oncologist  The patient does have a current PCP Abby Potash, PA-C) and does not need an [facility] hospital follow-up appointment after discharge.  The patient does not have transportation limitations that hinder transportation to clinic appointments.  .Services Needed at time of discharge: Y = Yes, Blank = No PT:   OT:   RN:   Equipment:   Other:     LOS: 3 days   Jule Ser, DO 01/15/2015, 9:30 AM PGY-1, [facility] Pager: [phone]

## 2015-01-15 NOTE — Discharge Summary (Signed)
Name: Janice Crawford MRN: [id number] DOB: December 22, 1975 39 y.o. PCP: Abby Potash, PA-C  Date of Admission: 01/12/2015  7:03 PM Date of Discharge: 01/15/2015 Attending Physician: Axel Filler, MD  Discharge Diagnosis: Principal Problem:   Acute pulmonary embolism [facility]) Active Problems:   Cancer of lower-inner quadrant of left female breast [facility])  Discharge Medications:   Medication List    STOP taking these medications        tamoxifen 20 MG tablet  Commonly known as:  NOLVADEX      TAKE these medications        Rivaroxaban 15 & 20 MG Tbpk  Commonly known as:  XARELTO STARTER PACK  Take as directed on package: Start with one 15mg  tablet by mouth twice a day with food. On Day 22, switch to one 20mg  tablet once a day with food.        Disposition and follow-up:   Janice Crawford was discharged from [facility] in Stable condition.    1.  At the hospital follow up visit please address: - Patients compliance with Xarelto.  She was discharged with starter pack 15mg  BID x 21 days, then 20mg  daily after. - CT scan from admission that showed abnormal anterior mediastinal soft tissue density.  The appearance favored rebound thymic hyperplasia, however, mediastinal lymphadenopathy could not definitely be excluded. - Tamoxifen: we held this on admission as it can act as a procoagulant.  We asked patient to continue not taking at discharge until she is able to follow up with Dr. Lindi Adie.  Please address this with patient.  2.  Labs / imaging needed at time of follow-up: none  3.  Pending labs/ test needing follow-up: none  Follow-up Appointments:     Follow-up Information    Follow up with Abby Potash, PA-C. Schedule an appointment as soon as possible for a visit in 1 week.   Specialty:  Physician Assistant      Follow up with Rulon Eisenmenger, MD. Schedule an appointment as soon as possible for a visit in 1 week.   Specialty:  Hematology  and Oncology   Contact information:   [place] [id number] [phone]       Discharge Instructions:   Consultations:    Procedures Performed:  Dg Chest 2 View  01/12/2015  CLINICAL DATA:  Shortness breath with chest pain and lightheadedness. History of pre diabetes. EXAM: CHEST  2 VIEW COMPARISON:  12/26/2007 radiographs. FINDINGS: Mild interval increase in the heart size, likely in part secondary to lower lung volumes. The heart size is at the upper limits of normal. Bilateral hilar prominence appears vascular. The lungs are clear. There is no pleural effusion or pneumothorax. There are surgical clips within the left breast and left axilla. No worrisome osseous findings. IMPRESSION: No acute cardiopulmonary process identified. Lower lung volumes with interval increase in the heart size, now at the upper limits of normal. Electronically Signed   By: Richardean Sale M.D.   On: 01/12/2015 18:12   Ct Angio Chest Pe W/cm &/or Wo Cm  01/12/2015  CLINICAL DATA:  Progressive chest pain and shortness of breath for past 5 days. Breast carcinoma. EXAM: CT ANGIOGRAPHY CHEST WITH CONTRAST TECHNIQUE: Multidetector CT imaging of the chest was performed using the standard protocol during bolus administration of intravenous contrast. Multiplanar CT image reconstructions and MIPs were obtained to evaluate the vascular anatomy. CONTRAST:  169mL OMNIPAQUE IOHEXOL 350 MG/ML SOLN COMPARISON:  None. FINDINGS:  Mediastinum/Lymph Nodes: Acute pulmonary embolism is seen in the distal right and left pulmonary arteries and all lobar branches. RV/LV ratio equals 1 .38, consistent with right heart strain. Abnormal soft tissue density is seen within the anterior mediastinum which has a somewhat triangular configuration. This appearance favors rebound thymic hyperplasia, however mediastinal lymphadenopathy cannot definitely be excluded. No axillary lymphadenopathy identified. Surgical clips seen in left  breast and axilla. Lungs/Pleura: No pulmonary mass, infiltrate, or effusion. Mild radiation changes seen within lingula. Upper abdomen: No acute findings. Musculoskeletal: No chest wall mass or suspicious bone lesions identified. Review of the MIP images confirms the above findings. IMPRESSION: Positive for acute PE with CT evidence of right heart strain (RV/LV Ratio = 1.38) consistent with at least submassive (intermediate risk) PE. The presence of right heart strain has been associated with an increased risk of morbidity and mortality. Please activate Code PE by paging [phone]. Abnormal anterior mediastinal soft tissue density. The appearance favors rebound thymic hyperplasia, however mediastinal lymphadenopathy cannot definitely be excluded. Critical Value/emergent results were called by telephone at the time of interpretation on 01/12/2015 at 9:48 pm to Dr. Daleen Bo , who verbally acknowledged these results. Electronically Signed   By: Earle Gell M.D.   On: 01/12/2015 21:51    2D Echo:  - Left ventricle: The cavity size was normal. Wall thickness was normal. Systolic function was normal. The estimated ejection fraction was in the range of 50% to 55%. Wall motion was normal; there were no regional wall motion abnormalities. Doppler parameters are consistent with abnormal left ventricular relaxation (grade 1 diastolic dysfunction). - Ventricular septum: The contour showed diastolic flattening and systolic flattening. - Right ventricle: The cavity size was mildly dilated. Systolic function was mildly reduced. - Right atrium: The atrium was mildly dilated. - Pulmonary arteries: PA peak pressure: 39 mm Hg (S).  Cardiac Cath: none  Admission HPI: Janice Crawford is a 39 year old lady with history of stage 1 breast cancer status-post lumpectomy in April 2016 and recently completed radiation course, presenting with chest tightness, shortness of breath, and lightheadedness,  all on exertion, as well as pleuritic chest pain. All of her symptoms began suddenly 6 days ago while walking out of the grocery story and, and they have gotten progressively worse since that time; now she can only walk about 10 steps before feeling lightheaded. Her chest tightness is substernal and is only present when exerting herself, without radiation or associated diaphoresis or nausea. She denies any lower extremity pain or swelling, bilateral leg swelling, abdominal pain, or other complaints. She used to smoke black and milds but quit 10 years ago, and drinks alcohol socially, about 2 drinks per week. She had one elective abortion in the past but has not had a spontaneous abortion. Her mother and brother both had blood clots after being diagnosed with cancer; breast cancer in her mother, and an unknown cancer in her brother. Notably, her father recently died of lung cancer one month ago, and this has been very stressful for her.  In the emergency department, she was tachycardic to 105, blood pressure 130/100, satting 95% on room air. Her BMP showed only mild hypokalemia and her CBC was normal. A chest CTA showed acute pulmonary emboli in the distal right and left pulmonary arteries and all lobar branches, with an RV/LV ratio of 1.38, consistent with right heart strain. An EKG showed new TWI in the lateral precordial leads, new from an EKG in May, with slight left axis  deviation, without S1Q3T3 nor ischemic changes. She was heparinized and admitted to IMTS.  Hospital Course by problem list: Principal Problem:   Acute pulmonary embolism ([facility]) Active Problems:   Cancer of lower-inner quadrant of left female breast ([place])   Acute bilateral submassive pulmonary emboli: patient presented with reported 6 days of worsening chest tightness, SOB, and lightheadedness.  All these symptoms were on exertion.  In reviewing her chart, it appears that she may have been experiencing some DOE in September.  At  admission, she denied any recent long travel, period of immobilization, current smoking, recent surgery, OCP use or history of spontaneous abortion.  She was treated this year for Stage 1 breast cancer s/p lumpectomy and radiation.  She was on Tamoxifen prior to admission.  One [place] study Jerilee Hoh, [place], et al. Cancer. 2009) suggested that Tamoxifen therapy may increase the risk for DVT/PE and that the first 2 years after initiation of therapy may be the most crucial time for monitoring.  On admission she was started on heparin drip.  This was stopped the next day and patient was started on Lovenox BID per pharmacy.  She received a transthoracic echo that showed normal LV systolic function, grade 1 diastolic dysfunction, septal flattening suggestive of pulmonary HTN, and mildly reduced RV function.  Lower extremity dopplers showed a DVT in the left popliteal vein and proximal left posterior tibial veins.  We also ambulated the patient to determine any home oxygen needs given her clot burden.  She maintained O2 saturation greater than 97% on room air for a distance of 400 feet indicating she did not need home oxygen.  She monitored in [facility] the entire hospitalization and discharged with a prescription for Xarelto 15mg  BID x 21 days, then 20mg  daily there after.   Stage 1a breast cancer:  Patient followed by Dr. Lindi Adie. Currently on Tamoxifen daily prior to admission. She recently completed lumpectomy and radiation. Her CT showed abnormal anterior mediastinal soft tissue density. The appearance favors rebound thymic hyperplasia, however mediastinal lymphadenopathy cannot definitely be excluded. Will ask for this to be followed up by her oncologist.  We also held Tamoxifen as mentioned above.  Discharge Vitals:   BP 127/91 mmHg  Pulse 101  Temp(Src) 98.5 F (36.9 C) (Oral)  Resp 21  Ht 5\' 4"  (1.626 m)  Wt 204 lb 2.3 oz (92.6 kg)  BMI 35.02 kg/m2  SpO2 98%  LMP 01/05/2015  Discharge Labs:    Results for orders placed or performed during the hospital encounter of 01/12/15 (from the past 24 hour(s))  CBC     Status: Abnormal   Collection Time: 01/15/15  3:26 AM  Result Value Ref Range   WBC 8.7 4.0 - 10.5 K/uL   RBC 3.86 (L) 3.87 - 5.11 MIL/uL   Hemoglobin 11.5 (L) 12.0 - 15.0 g/dL   HCT 33.9 (L) 36.0 - 46.0 %   MCV 87.8 78.0 - 100.0 fL   MCH 29.8 26.0 - 34.0 pg   MCHC 33.9 30.0 - 36.0 g/dL   RDW 13.0 11.5 - 15.5 %   Platelets 182 150 - 400 K/uL    Signed: Jule Ser, DO 01/15/2015, 11:29 AM    Services Ordered on Discharge: none Equipment Ordered on Discharge: none

## 2015-01-15 NOTE — Telephone Encounter (Signed)
Spoke with tech (covering) on floor in hosp ([facility] - [phone]) re f/u for patient 01/22/15 - I was forwarded to patient's room and spoke with re appointment. Keisha from floor called requesting 1 week f/u and desk nurse entered pof.

## 2015-01-15 NOTE — Progress Notes (Signed)
Pt discharged to home with family. Discharge instructions provided.  Attempted to schedule f/u visit with physician -- Dr.'s office to call patient when scheduler available.

## 2015-01-22 ENCOUNTER — Encounter: Payer: Self-pay | Admitting: Hematology and Oncology

## 2015-01-22 ENCOUNTER — Ambulatory Visit: Payer: Medicaid Other

## 2015-01-22 ENCOUNTER — Ambulatory Visit (HOSPITAL_BASED_OUTPATIENT_CLINIC_OR_DEPARTMENT_OTHER): Payer: Medicaid Other | Admitting: Hematology and Oncology

## 2015-01-22 VITALS — BP 126/84 | HR 68 | Temp 98.6°F | Resp 18 | Ht 64.0 in | Wt 204.0 lb

## 2015-01-22 DIAGNOSIS — I2699 Other pulmonary embolism without acute cor pulmonale: Secondary | ICD-10-CM

## 2015-01-22 DIAGNOSIS — C50312 Malignant neoplasm of lower-inner quadrant of left female breast: Secondary | ICD-10-CM | POA: Diagnosis not present

## 2015-01-22 DIAGNOSIS — Z170 Estrogen receptor positive status [ER+]: Secondary | ICD-10-CM | POA: Diagnosis not present

## 2015-01-22 NOTE — Addendum Note (Signed)
Addended by: Prentiss Bells on: 01/22/2015 06:06 PM   Modules accepted: Medications

## 2015-01-22 NOTE — Assessment & Plan Note (Addendum)
Acute pulmonary embolism diagnosed 01/12/2015 when she presented with substernal chest pain and shortness of breath and dizziness walking (distal right and left pulmonary arteries and all lobar branches)  On xarelto   CT chest 01/12/2015: positive for pulmonary embolism with CT evidence of right heart strain , abnormal anterior mediastinal soft tissue density fevers thymic hyperplasia however mediastinal lymphadenopathy cannot be excluded definitively   Recommendation: 1.  Continue with xarelto , she will be taking 20 mg daily after the starter pack is complete 2.  Referral to CT surgery regarding the anterior mediastinal soft tissue abnormality 3.  I also recommended doing hypercoagulability workup because of her young age of diagnosis

## 2015-01-22 NOTE — Assessment & Plan Note (Signed)
Left lumpectomy 04/19/2014: IDC grade 2, 1.5 cm, intermediate grade DCIS, 0/2 lymph nodes, ER 90%, PR 90%, HER-2 negative ratio 1.33, Ki-67 20%, Oncotype DX recurrence score 23, 15% ROR Right lumpectomy: Fibrocystic changes no malignancy. Completed adjuvant radiation 06/08/2014 to 07/28/2014 (Left breast 4500 cGy in 25 sessions, left breast boost 1800 cGy in 9 sessions), started tamoxifen in June 2016  Discontinued 01/12/2015 when she presented with pulmonary embolism  Options: 1.  Ovarian suppression plus anastrozole 2.  Versus observation  I discussed with her that no further antiestrogen therapy would significantly increase the risk of breast cancer recurrence.  A limited any antiestrogen therapy would have a slight increased risk of blood clots. Although the risk is much smaller with aromatase inhibitor therapy.

## 2015-01-22 NOTE — Progress Notes (Signed)
Patient Care Team: Abby Potash, PA-C as PCP - General (Physician Assistant) [name], MD as Consulting Physician (General Surgery) Nicholas Lose, MD as Consulting Physician (Hematology and Oncology) Arloa Koh, MD as Consulting Physician (Radiation Oncology) Sylvan Cheese, NP as Nurse Practitioner (Nurse Practitioner)  DIAGNOSIS: Cancer of lower-inner quadrant of left female breast [facility])   Staging form: Breast, AJCC 7th Edition     Clinical stage from 12/28/2013: Stage IA (T1c, N0, M0) - Unsigned     Pathologic stage from 04/24/2014: Stage IA (T1c, N0, cM0) - Signed by Enid Cutter, MD on 05/04/2014       Staging comments: Staged on final lumpectomy specimen by Dr. 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    04/19/2014 Definitive Surgery Left lumpectomy [facility]): IDC grade 2, 1.5 cm, intermediate grade DCIS, 0/2 lymph nodes, ER 90%, PR 90%, HER-2 negative ratio 1.33, Ki-67 20%,     04/19/2014 Oncotype testing Recurrence score 23 (15% ROR). No chemotherapy Lindi Adie).   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  2 LN negative for malignancy (0/2).  RIGHT lumpectomy: fibrocystic changes   06/08/2014 - 07/28/2014 Radiation Therapy Adjuvant RT completed Valere Dross).  Left breast45 Gy over 25 fractions. Left breast boost 18 cGy over 9 fractions.  Total dose: 60 Gy.    Anti-estrogen oral therapy Tamoxifen 20 mg daily (Erline Siddoway).  Planned duration of therapy 10 years.   11/17/2014 Survivorship Survivorship visit completed and copy of survivorship care plan provided to patient.   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 She then noted acute pulmonary embolism and was hospitalized from 01/12/2015 to 01/15/2015. We discontinued tamoxifen therapy and is here today on xarelto to discuss the overall treatment plan. She is tolerating xarelto extremely well without any problems or concerns. Her chest pain and shortness of breath have all improved significantly.  REVIEW OF SYSTEMS:   Constitutional: Denies fevers, chills or abnormal weight loss Eyes: Denies blurriness of vision Ears, nose, mouth, throat, and face: Denies mucositis or sore throat Respiratory: Denies cough, dyspnea or wheezes Cardiovascular: Denies palpitation, chest discomfort or lower extremity swelling Gastrointestinal:  Denies nausea, heartburn or change in bowel habits Skin: Denies abnormal skin rashes Lymphatics: Denies new lymphadenopathy or easy bruising Neurological:Denies numbness, tingling or new weaknesses Behavioral/Psych: Mood is stable, no new changes  All other systems were reviewed with the patient and are negative.  I have reviewed  the past medical history, past surgical history, social history  and family history with the patient and they are unchanged from previous note.  ALLERGIES:  is allergic to latex.  MEDICATIONS:  Current Outpatient Prescriptions  Medication Sig Dispense Refill  . Rivaroxaban (XARELTO STARTER PACK) 15 & 20 MG TBPK Take as directed on package: Start with one $Remove'15mg'fajINRE$  tablet by mouth twice a day with food. On Day 22, switch to one $Remo'20mg'vYtUW$  tablet once a day with food. 51 each 0   No current facility-administered medications for this visit.    PHYSICAL EXAMINATION: ECOG PERFORMANCE STATUS: 1 - Symptomatic but completely ambulatory  Filed Vitals:   01/22/15 1032  BP: 126/84  Pulse: 68  Temp: 98.6 F (37 C)  Resp: 18   Filed Weights   01/22/15 1032  Weight: 204 lb (92.534 kg)    GENERAL:alert, no distress and comfortable SKIN: skin color, texture, turgor are normal, no rashes or significant lesions EYES: normal, Conjunctiva are pink and non-injected, sclera clear OROPHARYNX:no exudate, no erythema and lips, buccal mucosa, and tongue normal  NECK: supple, thyroid normal size, non-tender, without nodularity LYMPH:  no palpable lymphadenopathy in the cervical, axillary or inguinal LUNGS: clear to auscultation and percussion with normal breathing effort HEART: regular rate & rhythm and no murmurs and no lower extremity edema ABDOMEN:abdomen soft, non-tender and normal bowel sounds Musculoskeletal:no cyanosis of digits and no clubbing  NEURO: alert & oriented x 3 with fluent speech, no focal motor/sensory deficits   LABORATORY DATA:  I have reviewed the data as listed   Chemistry      Component Value Date/Time   NA 138 01/14/2015 0400   NA 138 12/28/2013 0902   K 3.7 01/14/2015 0400   K 4.0 12/28/2013 0902   CL 109 01/14/2015 0400   CO2 20* 01/14/2015 0400   CO2 25 12/28/2013 0902   BUN 12 01/14/2015 0400   BUN 10.7 12/28/2013 0902   CREATININE 0.89 01/14/2015 0400   CREATININE 0.9 12/28/2013 0902      Component Value Date/Time   CALCIUM 8.6*  01/14/2015 0400   CALCIUM 9.2 12/28/2013 0902   ALKPHOS 39* 12/28/2013 0902   AST 15 12/28/2013 0902   ALT 12 12/28/2013 0902   BILITOT 0.47 12/28/2013 0902       Lab Results  Component Value Date   WBC 8.7 01/15/2015   HGB 11.5* 01/15/2015   HCT 33.9* 01/15/2015   MCV 87.8 01/15/2015   PLT 182 01/15/2015   NEUTROABS 4.3 12/28/2013   ASSESSMENT & PLAN:  Acute pulmonary embolism ([place])  Acute pulmonary embolism diagnosed 01/12/2015 when she presented with substernal chest pain and shortness of breath and dizziness walking (distal right and left pulmonary arteries and all lobar branches)  On xarelto   CT chest 01/12/2015: positive for pulmonary embolism with CT evidence of right heart strain , abnormal anterior mediastinal soft tissue density fevers thymic hyperplasia however mediastinal lymphadenopathy cannot be excluded definitively   Recommendation: 1.  Continue with xarelto , she will be taking 20 mg daily after the starter pack is complete 2.  Referral to CT surgery regarding the anterior mediastinal soft tissue abnormality (I missed discussing this result with her. When she comes back in 3 weeks I will discuss this.) 3.  I also recommended doing hypercoagulability workup because of her young age of diagnosis  Cancer of lower-inner quadrant of left female breast ([place]) Left lumpectomy 04/19/2014: IDC grade 2, 1.5 cm, intermediate grade DCIS, 0/2 lymph nodes, ER  90%, PR 90%, HER-2 negative ratio 1.33, Ki-67 20%, Oncotype DX recurrence score 23, 15% ROR Right lumpectomy: Fibrocystic changes no malignancy. Completed adjuvant radiation 06/08/2014 to 07/28/2014 (Left breast 4500 cGy in 25 sessions, left breast boost 1800 cGy in 9 sessions), started tamoxifen in June 2016  Discontinued 01/12/2015 when she presented with pulmonary embolism  We will order hypercoagulability workup. If the workup does not reveal any clear-cut underlying etiology for her blood clot, then we will have to  resume the blood clot was related to tamoxifen.  If it is tamoxifen related, her Options are: 1. Tamoxifen along with xarelto 2.  Ovarian suppression plus anastrozole 3.  Versus observation  I discussed with her that no further antiestrogen therapy would significantly increase the risk of breast cancer recurrence.  A limited any antiestrogen therapy would have a slight increased risk of blood clots. Although the risk is much smaller with aromatase inhibitor therapy.   No orders of the defined types were placed in this encounter.   The patient has a good understanding of the overall plan. she agrees with it. she will call with any problems that may develop before the next visit here.   Rulon Eisenmenger, MD 01/22/2015

## 2015-01-26 LAB — HYPERCOAGULABLE PANEL, COMPREHENSIVE
ANTITHROMB III FUNC: 93 %{activity} (ref 80–120)
Anticardiolipin IgA: <11 [APL'U]
Anticardiolipin IgG: <14 [GPL'U]
Beta-2-Glycoprotein I IgA: 20 SAU (ref <=20)
Beta-2-Glycoprotein I IgM: 9 SMU (ref <=20)
PROTEIN C ANTIGEN: 84 % (ref 70–140)
PROTEIN S ANTIGEN, TOTAL: 113 % (ref 70–140)
Protein C Activity: 69 % — ABNORMAL LOW (ref 70–180)
Protein S Activity: 114 % (ref 60–140)

## 2015-01-26 LAB — RFX DRVVT SCR W/RFLX CONF 1:1 MIX: dRVVT Screen: 60 s — ABNORMAL HIGH (ref <=45)

## 2015-01-26 LAB — FACTOR 8 ASSAY: COAGULATION FACTOR VIII: 179 % (ref 50–180)

## 2015-01-26 LAB — RFX PTT-LA W/RFX TO HEX PHASE CONF: PTT-LA SCREEN: 35 s (ref <=40)

## 2015-01-26 LAB — RFLX DRVVT CONFRIM: DRVVT CONFIRMATION: NEGATIVE

## 2015-02-06 ENCOUNTER — Other Ambulatory Visit: Payer: Self-pay

## 2015-02-06 DIAGNOSIS — I2699 Other pulmonary embolism without acute cor pulmonale: Secondary | ICD-10-CM | POA: Insufficient documentation

## 2015-02-06 MED ORDER — RIVAROXABAN 20 MG PO TABS: 20.0000 mg | ORAL_TABLET | Freq: Every day | ORAL | Status: DC

## 2015-02-06 NOTE — Telephone Encounter (Signed)
Advised pt refill would be sent immediately and to contact pharmacy directly for future refills.  Pt voiced understanding.

## 2015-02-16 ENCOUNTER — Telehealth: Payer: Self-pay | Admitting: Hematology and Oncology

## 2015-02-16 NOTE — Telephone Encounter (Signed)
Returned patients call as she should have been scheduled 3wks from 11/28,she is now coming in 12/30

## 2015-02-22 NOTE — Assessment & Plan Note (Signed)
Left lumpectomy 04/19/2014: IDC grade 2, 1.5 cm, intermediate grade DCIS, 0/2 lymph nodes, ER 90%, PR 90%, HER-2 negative ratio 1.33, Ki-67 20%, Oncotype DX recurrence score 23, 15% ROR Right lumpectomy: Fibrocystic changes no malignancy. Completed adjuvant radiation 06/08/2014 to 07/28/2014 (Left breast 4500 cGy in 25 sessions, left breast boost 1800 cGy in 9 sessions), started tamoxifen in June 2016 Discontinued 01/12/2015 when she presented with pulmonary embolism  Since her hypercoagulability panel is negative, we would have to attribute her pulmonary embolism to tamoxifen therapy. And hence her options for antiestrogen therapy ovarian suppression plus anastrozole versus no antiestrogen therapy.

## 2015-02-22 NOTE — Assessment & Plan Note (Addendum)
Acute pulmonary embolism diagnosed 01/12/2015 when she presented with substernal chest pain and shortness of breath and dizziness walking (distal right and left pulmonary arteries and all lobar branches) On xarelto  CT chest 01/12/2015: positive for pulmonary embolism with CT evidence of right heart strain , abnormal anterior mediastinal soft tissue density fevers thymic hyperplasia however mediastinal lymphadenopathy cannot be excluded definitively  Hypercoagulability panel did not reveal any abnormalities involving protein C, protein S, antithrombin III, 2 glycoprotein antibodies, anti-cardiolipin antibodies, factor V Leiden, factor VIII  Recommendation: Continue with xarelto , she will be taking 20 mg daily  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

## 2015-02-23 ENCOUNTER — Telehealth: Payer: Self-pay | Admitting: Hematology and Oncology

## 2015-02-23 ENCOUNTER — Encounter: Payer: Self-pay | Admitting: Hematology and Oncology

## 2015-02-23 ENCOUNTER — Ambulatory Visit (HOSPITAL_BASED_OUTPATIENT_CLINIC_OR_DEPARTMENT_OTHER): Payer: Medicaid Other | Admitting: Hematology and Oncology

## 2015-02-23 VITALS — BP 131/93 | HR 76 | Temp 98.7°F | Resp 18 | Ht 64.0 in | Wt 207.6 lb

## 2015-02-23 DIAGNOSIS — C50312 Malignant neoplasm of lower-inner quadrant of left female breast: Secondary | ICD-10-CM

## 2015-02-23 DIAGNOSIS — I2699 Other pulmonary embolism without acute cor pulmonale: Secondary | ICD-10-CM

## 2015-02-23 NOTE — Telephone Encounter (Signed)
Voicemail is full,letter and calendar mailed

## 2015-02-23 NOTE — Progress Notes (Signed)
Patient Care Team: Janice Potash, PA-C as PCP - General (Physician Assistant) [name], MD as Consulting Physician (General Surgery) Nicholas Lose, MD as Consulting Physician (Hematology and Oncology) Arloa Koh, MD as Consulting Physician (Radiation Oncology) Sylvan Cheese, NP as Nurse Practitioner (Nurse Practitioner)  DIAGNOSIS: Cancer of lower-inner quadrant of left female breast [facility])   Staging form: Breast, AJCC 7th Edition     Clinical stage from 12/28/2013: Stage IA (T1c, N0, M0) - Unsigned     Pathologic stage from 04/24/2014: Stage IA (T1c, N0, cM0) - Signed by Enid Cutter, MD on 05/04/2014       Staging comments: Staged on final lumpectomy specimen by Dr. 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    04/19/2014 Definitive Surgery Left lumpectomy [facility]): IDC grade 2, 1.5 cm, intermediate grade DCIS, 0/2 lymph nodes, ER 90%, PR 90%, HER-2 negative ratio 1.33, Ki-67 20%,     04/19/2014 Oncotype testing Recurrence score 23 (15% ROR). No chemotherapy Janice Crawford).   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  2 LN negative for malignancy (0/2).  RIGHT lumpectomy: fibrocystic changes   06/08/2014 - 07/28/2014 Radiation Therapy Adjuvant RT completed Janice Crawford).  Left breast45 Gy over 25 fractions. Left breast boost 18 cGy over 9 fractions.  Total dose: 60 Gy.    Anti-estrogen oral therapy Tamoxifen 20 mg daily (Janice Crawford).  Planned duration of therapy 10 years.   11/17/2014 Survivorship Survivorship visit completed and copy of survivorship care plan provided to patient.   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 She had hypercoagulability workup done and is here today to discuss the results. She does not have any problems with xarelto. She is tolerating extremely well. Denies any bruising or bleeding symptoms.  REVIEW OF SYSTEMS:   Constitutional: Denies fevers, chills or abnormal weight loss Eyes: Denies blurriness of vision Ears, nose, mouth, throat, and face: Denies mucositis or sore throat Respiratory: Denies cough, dyspnea or wheezes Cardiovascular: Denies palpitation, chest discomfort Gastrointestinal:  Denies nausea, heartburn or change in bowel habits Skin: Denies abnormal skin rashes Lymphatics: Denies new lymphadenopathy or easy bruising Neurological:Denies numbness, tingling or new weaknesses Behavioral/Psych: Mood is stable, no new changes  Extremities: No lower extremity edema Breast:  denies any pain or lumps or nodules in either breasts All other systems were reviewed with the patient and are negative.  I have reviewed the past medical history, past surgical history, social history and family  history with the patient and they are unchanged from previous note.  ALLERGIES:  is allergic to  latex.  MEDICATIONS:  Current Outpatient Prescriptions  Medication Sig Dispense Refill  . Rivaroxaban (XARELTO STARTER PACK) 15 & 20 MG TBPK Take as directed on package: Start with one '15mg'$  tablet by mouth twice a day with food. On Day 22, switch to one '20mg'$  tablet once a day with food. 51 each 0  . rivaroxaban (XARELTO) 20 MG TABS tablet Take 1 tablet (20 mg total) by mouth daily with supper. 30 tablet 0   No current facility-administered medications for this visit.    PHYSICAL EXAMINATION: ECOG PERFORMANCE STATUS: 0 - Asymptomatic  Filed Vitals:   02/23/15 1128  BP: 131/93  Pulse: 76  Temp: 98.7 F (37.1 C)  Resp: 18   Filed Weights   02/23/15 1128  Weight: 207 lb 9.6 oz (94.167 kg)    GENERAL:alert, no distress and comfortable SKIN: skin color, texture, turgor are normal, no rashes or significant lesions EYES: normal, Conjunctiva are pink and non-injected, sclera clear OROPHARYNX:no exudate, no erythema and lips, buccal mucosa, and tongue normal  NECK: supple, thyroid normal size, non-tender, without nodularity LYMPH:  no palpable lymphadenopathy in the cervical, axillary or inguinal LUNGS: clear to auscultation and percussion with normal breathing effort HEART: regular rate & rhythm and no murmurs and no lower extremity edema ABDOMEN:abdomen soft, non-tender and normal bowel sounds MUSCULOSKELETAL:no cyanosis of digits and no clubbing  NEURO: alert & oriented x 3 with fluent speech, no focal motor/sensory deficits EXTREMITIES: No lower extremity edema  LABORATORY DATA:  I have reviewed the data as listed   Chemistry      Component Value Date/Time   NA 138 01/14/2015 0400   NA 138 12/28/2013 0902   K 3.7 01/14/2015 0400   K 4.0 12/28/2013 0902   CL 109 01/14/2015 0400   CO2 20* 01/14/2015 0400   CO2 25 12/28/2013 0902   BUN 12 01/14/2015 0400   BUN 10.7 12/28/2013 0902   CREATININE 0.89 01/14/2015 0400   CREATININE 0.9 12/28/2013 0902      Component Value  Date/Time   CALCIUM 8.6* 01/14/2015 0400   CALCIUM 9.2 12/28/2013 0902   ALKPHOS 39* 12/28/2013 0902   AST 15 12/28/2013 0902   ALT 12 12/28/2013 0902   BILITOT 0.47 12/28/2013 0902       Lab Results  Component Value Date   WBC 8.7 01/15/2015   HGB 11.5* 01/15/2015   HCT 33.9* 01/15/2015   MCV 87.8 01/15/2015   PLT 182 01/15/2015   NEUTROABS 4.3 12/28/2013     ASSESSMENT & PLAN:  Acute pulmonary embolism (HCC) Acute pulmonary embolism diagnosed 01/12/2015 when she presented with substernal chest pain and shortness of breath and dizziness walking (distal right and left pulmonary arteries and all lobar branches) On xarelto  CT chest 01/12/2015: positive for pulmonary embolism with CT evidence of right heart strain , abnormal anterior mediastinal soft tissue density fevers thymic hyperplasia however mediastinal lymphadenopathy cannot be excluded definitively  Hypercoagulability panel did not reveal any abnormalities involving protein C, protein S, antithrombin III, 2 glycoprotein antibodies, anti-cardiolipin antibodies, factor V Leiden, factor VIII  Recommendation: Continue with xarelto , she will be taking 20 mg daily  Plan of treatment is 6 months, which will complete and of May 2017  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   Cancer of lower-inner quadrant of left female breast ([place]) Left lumpectomy 04/19/2014: IDC grade 2,  1.5 cm, intermediate grade DCIS, 0/2 lymph nodes, ER 90%, PR 90%, HER-2 negative ratio 1.33, Ki-67 20%, Oncotype DX recurrence score 23, 15% ROR Right lumpectomy: Fibrocystic changes no malignancy. 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  Since her hypercoagulability panel is negative, we would have to  attribute her pulmonary embolism to tamoxifen therapy. As long as the patient is on blood thinners, there is no concern for recurrent blood clot from tamoxifen. So I recommended that she remain on tamoxifen.   No orders of the defined types were placed in this encounter.   The patient has a good understanding of the overall plan. she agrees with it. she will call with any problems that may develop before the next visit here.   Rulon Eisenmenger, MD 02/23/2015

## 2015-02-27 ENCOUNTER — Other Ambulatory Visit: Payer: Self-pay | Admitting: *Deleted

## 2015-02-27 DIAGNOSIS — C50312 Malignant neoplasm of lower-inner quadrant of left female breast: Secondary | ICD-10-CM

## 2015-02-28 ENCOUNTER — Telehealth: Payer: Self-pay | Admitting: Hematology and Oncology

## 2015-02-28 NOTE — Telephone Encounter (Signed)
Spoke with patient re mammo for 1/9 at the [facility]. Patient given information to call [facility] for mri - breast. No other orders per 1/4 pof.

## 2015-03-05 ENCOUNTER — Inpatient Hospital Stay: Admission: RE | Admit: 2015-03-05 | Payer: Medicaid Other | Source: Ambulatory Visit

## 2015-03-16 ENCOUNTER — Ambulatory Visit
Admission: RE | Admit: 2015-03-16 | Discharge: 2015-03-16 | Disposition: A | Discharge status: Home / Self Care | Payer: Medicaid Other | Source: Ambulatory Visit | Attending: Hematology and Oncology | Admitting: Hematology and Oncology

## 2015-03-16 ENCOUNTER — Telehealth: Payer: Self-pay | Admitting: *Deleted

## 2015-03-16 ENCOUNTER — Other Ambulatory Visit: Payer: Self-pay | Admitting: *Deleted

## 2015-03-16 DIAGNOSIS — C50312 Malignant neoplasm of lower-inner quadrant of left female breast: Secondary | ICD-10-CM

## 2015-03-16 NOTE — Telephone Encounter (Signed)
Patient called requesting phone number to schedule mammogram and MRI. [facility] number to [facility] given to patient.

## 2015-03-23 ENCOUNTER — Telehealth: Payer: Self-pay | Admitting: *Deleted

## 2015-03-23 ENCOUNTER — Other Ambulatory Visit: Payer: Medicaid Other

## 2015-03-23 NOTE — Telephone Encounter (Signed)
Called [employer] and received the authorization for MRI [id number] Effective 03/22/15-04/21/15

## 2015-03-28 ENCOUNTER — Encounter: Payer: Self-pay | Admitting: Adult Health

## 2015-03-28 NOTE — Progress Notes (Signed)
A birthday card was mailed to the patient today on behalf of the Survivorship Program at [facility].   Dontray Haberland, NP Survivorship Program [facility] [phone]  

## 2015-03-29 ENCOUNTER — Ambulatory Visit: Payer: Medicaid Other | Admitting: Hematology and Oncology

## 2015-03-30 ENCOUNTER — Inpatient Hospital Stay: Admission: RE | Admit: 2015-03-30 | Payer: Medicaid Other | Source: Ambulatory Visit

## 2015-05-10 ENCOUNTER — Other Ambulatory Visit: Payer: Self-pay | Admitting: Internal Medicine

## 2015-05-15 ENCOUNTER — Other Ambulatory Visit: Payer: Self-pay | Admitting: Internal Medicine

## 2015-06-06 ENCOUNTER — Other Ambulatory Visit: Payer: Medicaid Other

## 2015-07-25 ENCOUNTER — Ambulatory Visit: Payer: Medicaid Other | Admitting: Hematology and Oncology

## 2015-07-25 ENCOUNTER — Other Ambulatory Visit: Payer: Medicaid Other

## 2015-07-25 NOTE — Assessment & Plan Note (Signed)
Left lumpectomy 04/19/2014: IDC grade 2, 1.5 cm, intermediate grade DCIS, 0/2 lymph nodes, ER 90%, PR 90%, HER-2 negative ratio 1.33, Ki-67 20%, Oncotype DX recurrence score 23, 15% ROR Right lumpectomy: Fibrocystic changes no malignancy. Completed adjuvant radiation 06/08/2014 to 07/28/2014 (Left breast 4500 cGy in 25 sessions, left breast boost 1800 cGy in 9 sessions), started tamoxifen in June 2016   PALB2 mutation: patient will need annual mammograms and breast MRIs for surveillance

## 2015-07-25 NOTE — Assessment & Plan Note (Signed)
Acute pulmonary embolism diagnosed 01/12/2015 when she presented with substernal chest pain and shortness of breath and dizziness walking (distal right and left pulmonary arteries and all lobar branches) On xarelto  CT chest 01/12/2015: positive for pulmonary embolism with CT evidence of right heart strain , abnormal anterior mediastinal soft tissue density fevers thymic hyperplasia however mediastinal lymphadenopathy cannot be excluded definitively  Hypercoagulability panel did not reveal any abnormalities involving protein C, protein S, antithrombin III, 2 glycoprotein antibodies, anti-cardiolipin antibodies, factor V Leiden, factor VIII  Recommendation: Continue with xarelto , she will be taking 20 mg daily  Plan of treatment is 6 months, which will complete and of May 2017  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

## 2015-08-23 ENCOUNTER — Emergency Department (HOSPITAL_COMMUNITY)
Admission: EM | Admit: 2015-08-23 | Discharge: 2015-08-24 | Disposition: A | Discharge status: Home / Self Care | Payer: Medicaid Other | Attending: Emergency Medicine | Admitting: Emergency Medicine

## 2015-08-23 ENCOUNTER — Encounter (HOSPITAL_COMMUNITY): Payer: Self-pay | Admitting: *Deleted

## 2015-08-23 DIAGNOSIS — R079 Chest pain, unspecified: Secondary | ICD-10-CM | POA: Diagnosis not present

## 2015-08-23 DIAGNOSIS — R531 Weakness: Secondary | ICD-10-CM | POA: Diagnosis present

## 2015-08-23 DIAGNOSIS — Z7901 Long term (current) use of anticoagulants: Secondary | ICD-10-CM | POA: Insufficient documentation

## 2015-08-23 DIAGNOSIS — Z91040 Latex allergy status: Secondary | ICD-10-CM | POA: Insufficient documentation

## 2015-08-23 DIAGNOSIS — Z87891 Personal history of nicotine dependence: Secondary | ICD-10-CM | POA: Diagnosis not present

## 2015-08-23 DIAGNOSIS — Z853 Personal history of malignant neoplasm of breast: Secondary | ICD-10-CM | POA: Insufficient documentation

## 2015-08-23 LAB — CBC
HCT: 37 % (ref 36.0–46.0)
HEMOGLOBIN: 12.3 g/dL (ref 12.0–15.0)
MCH: 29.8 pg (ref 26.0–34.0)
MCHC: 33.2 g/dL (ref 30.0–36.0)
MCV: 89.6 fL (ref 78.0–100.0)
Platelets: 263 10*3/uL (ref 150–400)
RBC: 4.13 MIL/uL (ref 3.87–5.11)
RDW: 13.3 % (ref 11.5–15.5)
WBC: 8.2 10*3/uL (ref 4.0–10.5)

## 2015-08-23 LAB — URINALYSIS, ROUTINE W REFLEX MICROSCOPIC
Bilirubin Urine: NEGATIVE
Glucose, UA: NEGATIVE mg/dL
Hgb urine dipstick: NEGATIVE
Ketones, ur: 15 mg/dL — AB
LEUKOCYTES UA: NEGATIVE
NITRITE: NEGATIVE
PROTEIN: NEGATIVE mg/dL
SPECIFIC GRAVITY, URINE: 1.036 — AB (ref 1.005–1.030)
pH: 5.5 (ref 5.0–8.0)

## 2015-08-23 LAB — URINE MICROSCOPIC-ADD ON
BACTERIA UA: NONE SEEN
RBC / HPF: NONE SEEN RBC/hpf (ref 0–5)
SQUAMOUS EPITHELIAL / LPF: NONE SEEN

## 2015-08-23 LAB — BASIC METABOLIC PANEL
ANION GAP: 6 (ref 5–15)
BUN: 12 mg/dL (ref 6–20)
CALCIUM: 9.3 mg/dL (ref 8.9–10.3)
CO2: 23 mmol/L (ref 22–32)
Chloride: 105 mmol/L (ref 101–111)
Creatinine, Ser: 0.91 mg/dL (ref 0.44–1.00)
Glucose, Bld: 92 mg/dL (ref 65–99)
Potassium: 3.6 mmol/L (ref 3.5–5.1)
SODIUM: 134 mmol/L — AB (ref 135–145)

## 2015-08-23 LAB — CBG MONITORING, ED: GLUCOSE-CAPILLARY: 88 mg/dL (ref 65–99)

## 2015-08-23 LAB — D-DIMER, QUANTITATIVE (NOT AT ARMC): D DIMER QUANT: 0.62 ug{FEU}/mL — AB (ref 0.00–0.50)

## 2015-08-23 LAB — I-STAT BETA HCG BLOOD, ED (MC, WL, AP ONLY): I-stat hCG, quantitative: <5 m[IU]/mL (ref <5)

## 2015-08-23 NOTE — ED Notes (Signed)
CBG is 88.
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
## 2015-08-23 NOTE — ED Notes (Signed)
Spoke to Dr Rogene Houston regarding pt, received verbal order for ddimer.

## 2015-08-23 NOTE — ED Notes (Signed)
Pt states that she has been experiencing fever/chills and weakness x 3 days. States she had a blood clot in her lunch last year in November. States she was on blood thinners until the end pofApril/beginning of May. States she has similar symptoms to the ones she had with the the blood clot.

## 2015-08-24 ENCOUNTER — Emergency Department (HOSPITAL_COMMUNITY): Payer: Medicaid Other

## 2015-08-24 ENCOUNTER — Encounter (HOSPITAL_COMMUNITY): Payer: Self-pay | Admitting: *Deleted

## 2015-08-24 ENCOUNTER — Other Ambulatory Visit: Payer: Self-pay | Admitting: *Deleted

## 2015-08-24 ENCOUNTER — Emergency Department (HOSPITAL_COMMUNITY)
Admission: EM | Admit: 2015-08-24 | Discharge: 2015-08-24 | Disposition: A | Discharge status: Home / Self Care | Payer: Medicaid Other | Source: Home / Self Care | Attending: Emergency Medicine | Admitting: Emergency Medicine

## 2015-08-24 DIAGNOSIS — Z91040 Latex allergy status: Secondary | ICD-10-CM | POA: Insufficient documentation

## 2015-08-24 DIAGNOSIS — Z87891 Personal history of nicotine dependence: Secondary | ICD-10-CM | POA: Insufficient documentation

## 2015-08-24 DIAGNOSIS — Z853 Personal history of malignant neoplasm of breast: Secondary | ICD-10-CM | POA: Insufficient documentation

## 2015-08-24 DIAGNOSIS — R079 Chest pain, unspecified: Secondary | ICD-10-CM | POA: Insufficient documentation

## 2015-08-24 MED ORDER — IOPAMIDOL (ISOVUE-370) INJECTION 76%
INTRAVENOUS | Status: AC
  Administered 2015-08-24: 100 mL
  Filled 2015-08-24: qty 100
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
## 2015-08-24 NOTE — ED Notes (Signed)
Pt back from radiology 

## 2015-08-24 NOTE — ED Notes (Signed)
The pt was here last pm and had a [place] placed by the edp in her rt a-c  It blew and infltrated in c-t  She was told to come back for another test but she does not remember what  Type of test.  She imnitially came in for symptoms of  Weakness and headaches  Hx of pe  No blood thinners since april

## 2015-08-24 NOTE — Discharge Instructions (Signed)
Please come to the ER tomorrow morning at 8:30 for your V/Q scan.   You will need to check-in at the ER and ask to be taken to the Radiology Department where a technician will be expecting you.  Nonspecific Chest Pain  Chest pain can be caused by many different conditions. There is always a chance that your pain could be related to something serious, such as a heart attack or a blood clot in your lungs. Chest pain can also be caused by conditions that are not life-threatening. If you have chest pain, it is very important to follow up with your health care provider. CAUSES  Chest pain can be caused by:  Heartburn.  Pneumonia or bronchitis.  Anxiety or stress.  Inflammation around your heart (pericarditis) or lung (pleuritis or pleurisy).  A blood clot in your lung.  A collapsed lung (pneumothorax). It can develop suddenly on its own (spontaneous pneumothorax) or from trauma to the chest.  Shingles infection (varicella-zoster virus).  Heart attack.  Damage to the bones, muscles, and cartilage that make up your chest wall. This can include:  Bruised bones due to injury.  Strained muscles or cartilage due to frequent or repeated coughing or overwork.  Fracture to one or more ribs.  Sore cartilage due to inflammation (costochondritis). RISK FACTORS  Risk factors for chest pain may include:  Activities that increase your risk for trauma or injury to your chest.  Respiratory infections or conditions that cause frequent coughing.  Medical conditions or overeating that can cause heartburn.  Heart disease or family history of heart disease.  Conditions or health behaviors that increase your risk of developing a blood clot.  Having had chicken pox (varicella zoster). SIGNS AND SYMPTOMS Chest pain can feel like:  Burning or tingling on the surface of your chest or deep in your chest.  Crushing, pressure, aching, or squeezing pain.  Dull or sharp pain that is worse when you  move, cough, or take a deep breath.  Pain that is also felt in your back, neck, shoulder, or arm, or pain that spreads to any of these areas. Your chest pain may come and go, or it may stay constant. DIAGNOSIS Lab tests or other studies may be needed to find the cause of your pain. Your health care provider may have you take a test called an ambulatory ECG (electrocardiogram). An ECG records your heartbeat patterns at the time the test is performed. You may also have other tests, such as:  Transthoracic echocardiogram (TTE). During echocardiography, sound waves are used to create a picture of all of the heart structures and to look at how blood flows through your heart.  Transesophageal echocardiogram (TEE).This is a more advanced imaging test that obtains images from inside your body. It allows your health care provider to see your heart in finer detail.  Cardiac monitoring. This allows your health care provider to monitor your heart rate and rhythm in real time.  Holter monitor. This is a portable device that records your heartbeat and can help to diagnose abnormal heartbeats. It allows your health care provider to track your heart activity for several days, if needed.  Stress tests. These can be done through exercise or by taking medicine that makes your heart beat more quickly.  Blood tests.  Imaging tests. TREATMENT  Your treatment depends on what is causing your chest pain. Treatment may include:  Medicines. These may include:  Acid blockers for heartburn.  Anti-inflammatory medicine.  Pain medicine for inflammatory  conditions.  Antibiotic medicine, if an infection is present.  Medicines to dissolve blood clots.  Medicines to treat coronary artery disease.  Supportive care for conditions that do not require medicines. This may include:  Resting.  Applying heat or cold packs to injured areas.  Limiting activities until pain decreases. HOME CARE INSTRUCTIONS  If you  were prescribed an antibiotic medicine, finish it all even if you start to feel better.  Avoid any activities that bring on chest pain.  Do not use any tobacco products, including cigarettes, chewing tobacco, or electronic cigarettes. If you need help quitting, ask your health care provider.  Do not drink alcohol.  Take medicines only as directed by your health care provider.  Keep all follow-up visits as directed by your health care provider. This is important. This includes any further testing if your chest pain does not go away.  If heartburn is the cause for your chest pain, you may be told to keep your head raised (elevated) while sleeping. This reduces the chance that acid will go from your stomach into your esophagus.  Make lifestyle changes as directed by your health care provider. These may include:  Getting regular exercise. Ask your health care provider to suggest some activities that are safe for you.  Eating a heart-healthy diet. A registered dietitian can help you to learn healthy eating options.  Maintaining a healthy weight.  Managing diabetes, if necessary.  Reducing stress. SEEK MEDICAL CARE IF:  Your chest pain does not go away after treatment.  You have a rash with blisters on your chest.  You have a fever. SEEK IMMEDIATE MEDICAL CARE IF:   Your chest pain is worse.  You have an increasing cough, or you cough up blood.  You have severe abdominal pain.  You have severe weakness.  You faint.  You have chills.  You have sudden, unexplained chest discomfort.  You have sudden, unexplained discomfort in your arms, back, neck, or jaw.  You have shortness of breath at any time.  You suddenly start to sweat, or your skin gets clammy.  You feel nauseous or you vomit.  You suddenly feel light-headed or dizzy.  Your heart begins to beat quickly, or it feels like it is skipping beats. These symptoms may represent a serious problem that is an  emergency. Do not wait to see if the symptoms will go away. Get medical help right away. Call your local emergency services (911 in the [place] Do not drive yourself to the hospital.   This information is not intended to replace advice given to you by your health care provider. Make sure you discuss any questions you have with your health care provider.   Document Released: 11/20/2004 Document Revised: 03/03/2014 Document Reviewed: 09/16/2013 Elsevier Interactive Patient Education [employer].

## 2015-08-24 NOTE — Discharge Instructions (Signed)

## 2015-08-25 ENCOUNTER — Encounter (HOSPITAL_COMMUNITY): Payer: Self-pay | Admitting: Emergency Medicine

## 2015-08-25 ENCOUNTER — Ambulatory Visit (HOSPITAL_COMMUNITY): Payer: Medicaid Other

## 2015-08-25 ENCOUNTER — Ambulatory Visit (HOSPITAL_COMMUNITY)
Admission: AD | Admit: 2015-08-25 | Discharge: 2015-08-25 | Disposition: A | Discharge status: Home / Self Care | Payer: Medicaid Other | Source: Ambulatory Visit | Attending: Emergency Medicine | Admitting: Emergency Medicine

## 2015-08-25 ENCOUNTER — Observation Stay (HOSPITAL_COMMUNITY)
Admission: EM | Admit: 2015-08-25 | Discharge: 2015-08-27 | Disposition: A | Discharge status: Home / Self Care | Payer: Medicaid Other | Attending: Internal Medicine | Admitting: Internal Medicine

## 2015-08-25 ENCOUNTER — Emergency Department (HOSPITAL_COMMUNITY)
Admission: EM | Admit: 2015-08-25 | Discharge: 2015-08-25 | Discharge status: Absent without leave | Payer: Medicaid Other

## 2015-08-25 DIAGNOSIS — Z86711 Personal history of pulmonary embolism: Secondary | ICD-10-CM | POA: Diagnosis not present

## 2015-08-25 DIAGNOSIS — Z87891 Personal history of nicotine dependence: Secondary | ICD-10-CM | POA: Insufficient documentation

## 2015-08-25 DIAGNOSIS — G8929 Other chronic pain: Secondary | ICD-10-CM | POA: Insufficient documentation

## 2015-08-25 DIAGNOSIS — J9859 Other diseases of mediastinum, not elsewhere classified: Secondary | ICD-10-CM

## 2015-08-25 DIAGNOSIS — D649 Anemia, unspecified: Secondary | ICD-10-CM | POA: Diagnosis not present

## 2015-08-25 DIAGNOSIS — M79661 Pain in right lower leg: Secondary | ICD-10-CM

## 2015-08-25 DIAGNOSIS — Z853 Personal history of malignant neoplasm of breast: Secondary | ICD-10-CM | POA: Insufficient documentation

## 2015-08-25 DIAGNOSIS — K219 Gastro-esophageal reflux disease without esophagitis: Secondary | ICD-10-CM | POA: Insufficient documentation

## 2015-08-25 DIAGNOSIS — R079 Chest pain, unspecified: Secondary | ICD-10-CM

## 2015-08-25 DIAGNOSIS — I82402 Acute embolism and thrombosis of unspecified deep veins of left lower extremity: Secondary | ICD-10-CM | POA: Diagnosis not present

## 2015-08-25 DIAGNOSIS — E876 Hypokalemia: Secondary | ICD-10-CM | POA: Diagnosis not present

## 2015-08-25 DIAGNOSIS — I2699 Other pulmonary embolism without acute cor pulmonale: Principal | ICD-10-CM | POA: Insufficient documentation

## 2015-08-25 DIAGNOSIS — M79662 Pain in left lower leg: Secondary | ICD-10-CM

## 2015-08-25 DIAGNOSIS — Z7901 Long term (current) use of anticoagulants: Secondary | ICD-10-CM | POA: Insufficient documentation

## 2015-08-25 DIAGNOSIS — E320 Persistent hyperplasia of thymus: Secondary | ICD-10-CM | POA: Insufficient documentation

## 2015-08-25 DIAGNOSIS — Z923 Personal history of irradiation: Secondary | ICD-10-CM | POA: Insufficient documentation

## 2015-08-25 DIAGNOSIS — R7303 Prediabetes: Secondary | ICD-10-CM | POA: Insufficient documentation

## 2015-08-25 DIAGNOSIS — C50312 Malignant neoplasm of lower-inner quadrant of left female breast: Secondary | ICD-10-CM | POA: Diagnosis present

## 2015-08-25 DIAGNOSIS — I82409 Acute embolism and thrombosis of unspecified deep veins of unspecified lower extremity: Secondary | ICD-10-CM

## 2015-08-25 LAB — PROTIME-INR
INR: 1.06 (ref 0.00–1.49)
Prothrombin Time: 14 seconds (ref 11.6–15.2)

## 2015-08-25 MED ORDER — TECHNETIUM TO 99M ALBUMIN AGGREGATED
4.2700 | Freq: Once | INTRAVENOUS | Status: AC | PRN
  Administered 2015-08-25: 4 via INTRAVENOUS

## 2015-08-25 MED ORDER — ONDANSETRON HCL 4 MG PO TABS: 4.0000 mg | ORAL_TABLET | Freq: Four times a day (QID) | ORAL | Status: DC | PRN

## 2015-08-25 MED ORDER — KETOROLAC TROMETHAMINE 30 MG/ML IJ SOLN
30.0000 mg | Freq: Once | INTRAMUSCULAR | Status: AC
  Administered 2015-08-25: 30 mg via INTRAVENOUS
  Filled 2015-08-25: qty 1

## 2015-08-25 MED ORDER — ONDANSETRON HCL 4 MG/2ML IJ SOLN: 4.0000 mg | Freq: Four times a day (QID) | INTRAMUSCULAR | Status: DC | PRN

## 2015-08-25 MED ORDER — ENOXAPARIN SODIUM 100 MG/ML ~~LOC~~ SOLN
100.0000 mg | Freq: Once | SUBCUTANEOUS | Status: AC
  Administered 2015-08-25: 100 mg via SUBCUTANEOUS
  Filled 2015-08-25: qty 1

## 2015-08-25 MED ORDER — ACETAMINOPHEN 325 MG PO TABS
650.0000 mg | ORAL_TABLET | Freq: Four times a day (QID) | ORAL | Status: DC | PRN
Start: 2015-08-25 — End: 2015-08-27

## 2015-08-25 MED ORDER — HYDROCODONE-ACETAMINOPHEN 5-325 MG PO TABS
1.0000 | ORAL_TABLET | ORAL | Status: DC | PRN
  Administered 2015-08-25: 2 via ORAL
  Administered 2015-08-25: 1 via ORAL
  Administered 2015-08-26 – 2015-08-27 (×4): 2 via ORAL
  Filled 2015-08-25: qty 2
  Filled 2015-08-25: qty 1
  Filled 2015-08-25 (×4): qty 2

## 2015-08-25 MED ORDER — IBUPROFEN 400 MG PO TABS
400.0000 mg | ORAL_TABLET | Freq: Four times a day (QID) | ORAL | Status: DC | PRN
  Administered 2015-08-26: 400 mg via ORAL
  Filled 2015-08-25: qty 1

## 2015-08-25 MED ORDER — MORPHINE SULFATE (PF) 2 MG/ML IV SOLN
1.0000 mg | INTRAVENOUS | Status: DC | PRN
  Administered 2015-08-25: 1 mg via INTRAVENOUS
  Filled 2015-08-25: qty 1

## 2015-08-25 MED ORDER — SENNOSIDES-DOCUSATE SODIUM 8.6-50 MG PO TABS: 1.0000 | ORAL_TABLET | Freq: Every evening | ORAL | Status: DC | PRN

## 2015-08-25 MED ORDER — TRAZODONE HCL 50 MG PO TABS
25.0000 mg | ORAL_TABLET | Freq: Every evening | ORAL | Status: DC | PRN
  Filled 2015-08-25 (×2): qty 1

## 2015-08-25 MED ORDER — MAGNESIUM CITRATE PO SOLN: 1.0000 | Freq: Once | ORAL | Status: DC | PRN

## 2015-08-25 MED ORDER — TECHNETIUM TC 99M DIETHYLENETRIAME-PENTAACETIC ACID: 32.7000 | Freq: Once | INTRAVENOUS | Status: DC | PRN

## 2015-08-25 MED ORDER — ENOXAPARIN SODIUM 100 MG/ML ~~LOC~~ SOLN
100.0000 mg | Freq: Two times a day (BID) | SUBCUTANEOUS | Status: DC
  Administered 2015-08-26: 100 mg via SUBCUTANEOUS
  Filled 2015-08-25: qty 1

## 2015-08-25 MED ORDER — ACETAMINOPHEN 650 MG RE SUPP: 650.0000 mg | Freq: Four times a day (QID) | RECTAL | Status: DC | PRN

## 2015-08-25 MED ORDER — BISACODYL 10 MG RE SUPP: 10.0000 mg | Freq: Every day | RECTAL | Status: DC | PRN

## 2015-08-25 NOTE — Progress Notes (Signed)
Pt admitted from ED this pm for medical management. Alert, oriented and able to voice needs. No concerns expressed at this time
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
## 2015-08-25 NOTE — ED Notes (Signed)
Patient had to be re-entered to order radiology studies that ordered by Kathrynn Humble last pm before patient discharged home
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
## 2015-08-25 NOTE — ED Notes (Signed)
The patient has been here three days in a row.  She was here on the 29th had an elevated D-dimer and left she said "because they blew her vein  in CT" but Dr.'s notes say she needed to leave due to family obligations.  She was asked to return and have VQ scan today.  She returned today, had VQ scan done and was sent down here due to VQ scan being positive.  The patient denies any pain.

## 2015-08-26 ENCOUNTER — Telehealth: Payer: Self-pay | Admitting: Hematology and Oncology

## 2015-08-26 ENCOUNTER — Encounter (HOSPITAL_COMMUNITY): Payer: Self-pay | Admitting: Radiology

## 2015-08-26 ENCOUNTER — Observation Stay (HOSPITAL_COMMUNITY): Payer: Medicaid Other

## 2015-08-26 DIAGNOSIS — I2699 Other pulmonary embolism without acute cor pulmonale: Secondary | ICD-10-CM

## 2015-08-26 DIAGNOSIS — C50312 Malignant neoplasm of lower-inner quadrant of left female breast: Secondary | ICD-10-CM

## 2015-08-26 DIAGNOSIS — E876 Hypokalemia: Secondary | ICD-10-CM | POA: Diagnosis not present

## 2015-08-26 LAB — COMPREHENSIVE METABOLIC PANEL
ALBUMIN: 3 g/dL — AB (ref 3.5–5.0)
ALK PHOS: 43 U/L (ref 38–126)
ALT: 11 U/L — ABNORMAL LOW (ref 14–54)
ANION GAP: 8 (ref 5–15)
AST: 15 U/L (ref 15–41)
BILIRUBIN TOTAL: 0.7 mg/dL (ref 0.3–1.2)
BUN: 8 mg/dL (ref 6–20)
CALCIUM: 8.7 mg/dL — AB (ref 8.9–10.3)
CO2: 24 mmol/L (ref 22–32)
Chloride: 103 mmol/L (ref 101–111)
Creatinine, Ser: 0.79 mg/dL (ref 0.44–1.00)
GLUCOSE: 97 mg/dL (ref 65–99)
Potassium: 3.3 mmol/L — ABNORMAL LOW (ref 3.5–5.1)
Sodium: 135 mmol/L (ref 135–145)
TOTAL PROTEIN: 6.6 g/dL (ref 6.5–8.1)

## 2015-08-26 LAB — CBC
HCT: 33.7 % — ABNORMAL LOW (ref 36.0–46.0)
HEMOGLOBIN: 11 g/dL — AB (ref 12.0–15.0)
MCH: 29.2 pg (ref 26.0–34.0)
MCHC: 32.6 g/dL (ref 30.0–36.0)
MCV: 89.4 fL (ref 78.0–100.0)
Platelets: 261 10*3/uL (ref 150–400)
RBC: 3.77 MIL/uL — ABNORMAL LOW (ref 3.87–5.11)
RDW: 13.2 % (ref 11.5–15.5)
WBC: 5.9 10*3/uL (ref 4.0–10.5)

## 2015-08-26 LAB — LIPID PANEL
CHOLESTEROL: 170 mg/dL (ref 0–200)
HDL: 37 mg/dL — ABNORMAL LOW (ref >40)
LDL Cholesterol: 116 mg/dL — ABNORMAL HIGH (ref 0–99)
TRIGLYCERIDES: 86 mg/dL (ref <150)
Total CHOL/HDL Ratio: 4.6 RATIO
VLDL: 17 mg/dL (ref 0–40)

## 2015-08-26 LAB — BRAIN NATRIURETIC PEPTIDE: B Natriuretic Peptide: 30.4 pg/mL (ref 0.0–100.0)

## 2015-08-26 LAB — TROPONIN I: Troponin I: <0.03 ng/mL (ref <0.03)

## 2015-08-26 MED ORDER — NONFORMULARY OR COMPOUNDED ITEM: Status: DC

## 2015-08-26 MED ORDER — IOPAMIDOL (ISOVUE-300) INJECTION 61%
INTRAVENOUS | Status: AC
  Filled 2015-08-26: qty 75

## 2015-08-26 MED ORDER — IOPAMIDOL (ISOVUE-300) INJECTION 61%
75.0000 mL | Freq: Once | INTRAVENOUS | Status: AC | PRN
  Administered 2015-08-26: 75 mL via INTRAVENOUS

## 2015-08-26 MED ORDER — FERROUS SULFATE 325 (65 FE) MG PO TABS
325.0000 mg | ORAL_TABLET | Freq: Every day | ORAL | Status: DC
  Administered 2015-08-27: 325 mg via ORAL
  Filled 2015-08-26: qty 1

## 2015-08-26 MED ORDER — RIVAROXABAN 20 MG PO TABS: 20.0000 mg | ORAL_TABLET | Freq: Every day | ORAL | Status: DC

## 2015-08-26 MED ORDER — RIVAROXABAN 15 MG PO TABS
15.0000 mg | ORAL_TABLET | Freq: Two times a day (BID) | ORAL | Status: DC
  Administered 2015-08-26 – 2015-08-27 (×3): 15 mg via ORAL
  Filled 2015-08-26 (×3): qty 1

## 2015-08-26 NOTE — Discharge Instructions (Addendum)
Information on my medicine - XARELTO (rivaroxaban)  This medication education was reviewed with me or my healthcare representative as part of my discharge preparation.  The pharmacist that spoke with me during my hospital stay was:  Lawson Radar, [place]? Xarelto was prescribed to treat blood clots that may have been found in the veins of your legs (deep vein thrombosis) or in your lungs (pulmonary embolism) and to reduce the risk of them occurring again.  What do you need to know about Xarelto? The starting dose is one 15 mg tablet taken TWICE daily with food for the FIRST 21 DAYS then on MONDAY, JULY 24TH  the dose is changed to one 20 mg tablet taken ONCE A DAY with your evening meal.  DO NOT stop taking Xarelto without talking to the health care provider who prescribed the medication.  Refill your prescription for 20 mg tablets before you run out.  After discharge, you should have regular check-up appointments with your healthcare provider that is prescribing your Xarelto.  In the future your dose may need to be changed if your kidney function changes by a significant amount.  What do you do if you miss a dose? If you are taking Xarelto TWICE DAILY and you miss a dose, take it as soon as you remember. You may take two 15 mg tablets (total 30 mg) at the same time then resume your regularly scheduled 15 mg twice daily the next day.  If you are taking Xarelto ONCE DAILY and you miss a dose, take it as soon as you remember on the same day then continue your regularly scheduled once daily regimen the next day. Do not take two doses of Xarelto at the same time.   Important Safety Information Xarelto is a blood thinner medicine that can cause bleeding. You should call your healthcare provider right away if you experience any of the following: ? Bleeding from an injury or your nose that does not stop. ? Unusual colored urine (red or dark brown) or  unusual colored stools (red or black). ? Unusual bruising for unknown reasons. ? A serious fall or if you hit your head (even if there is no bleeding).  Some medicines may interact with Xarelto and might increase your risk of bleeding while on Xarelto. To help avoid this, consult your healthcare provider or pharmacist prior to using any new prescription or non-prescription medications, including herbals, vitamins, non-steroidal anti-inflammatory drugs (NSAIDs) and supplements.  This website has more information on Xarelto: [email or web address].[email or web address]

## 2015-08-26 NOTE — Progress Notes (Deleted)
Pt's wife mentioned that his medicare insurance took effect today and says that if anyone has questions about his insurance they can contact her

## 2015-08-26 NOTE — Progress Notes (Signed)
ANTICOAGULATION CONSULT NOTE - Follow Up Consult  Pharmacy Consult for Lovenox >> Xarelto Indication: pulmonary embolus  Allergies  Allergen Reactions  . Latex Hives    Patient Measurements:    Vital Signs: Temp: 98.7 F (37.1 C) (07/02 0543) BP: 118/78 mmHg (07/02 0543) Pulse Rate: 70 (07/02 0543)  Labs:  Recent Labs  08/23/15 1640 08/25/15 1441 08/26/15 0536  HGB 12.3  --  11.0*  HCT 37.0  --  33.7*  PLT 263  --  261  LABPROT  --  14.0  --   INR  --  1.06  --   CREATININE 0.91  --  0.79    Estimated Creatinine Clearance: 108.4 mL/min (by C-G formula based on Cr of 0.79).   Assessment: 69 YOF with hx prior PE - recently stopped Xarelto in April '17 who presented to the [facility] on 7/1 with dyspnea. VQ scan confirms new PE and pharmacy was consulted to start lovenox for anticoagulation >> now to transition to Xarelto on 7/2.   The patient received Lovenox 100 mg SQ at 0545 this AM - will start Xarelto dosing this evening with supper. SCr 0.79, CrCl~100 ml/min.   Goal of Therapy:  Appropriate anticoagulation for indication and hepatic/renal function   Plan:  1. Start Xarelto 15 mg bid with food this evening and continue thru 7/23 2. On 7/24, start Xarelto 20 mg once daily with supper 3. Will plan to educate prior to discharge 4. Will monitor for signs/symptoms of bleeding and renal function for any necessary dose adjustments.   Alycia Rossetti, PharmD, BCPS Clinical Pharmacist Pager: [phone] 08/26/2015 10:20 AM

## 2015-08-26 NOTE — Progress Notes (Signed)
PROGRESS NOTE  Janice Crawford  [id number] DOB: [birth year] DOA: 08/25/2015 PCP: Abby Potash, PA-C  Brief Narrative:   Janice Crawford is a 40 y.o. 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 She "knew" these symptoms were similar to her prior PE. At the time, a CT angiogram was planned, but was not successful, as the IV catheter extravasated. A V/Q study was recommended, but as the patient had family obligations, this had to be postponed until today. V/Q scan shows intermediate probability for PE, for which the patient is to be admitted for the management of presumed PE. Denies pleuritic chest pain, fever, chills or night sweats. Reports L>R lower extremity calf pain,without swelling or erythema. Denies pre-syncopal episodes, palpitations or hemoptysis. Denies any bleeding issues such as epistaxis, hematemesis, hematuria or hematochezia. Denies tobacco use.She reports family history of PE in mother and brother. Sedentary lifestyle. She denies taking NSAIDs. No recent long distance trips. Denies any recent infections or sick contacts.No recent surgeries. She admits to eat high fat foods having gained 60 lbs over the last year. Denies pregnancy (tubes tied) She does not take ASA. Demies sickle cell disease or trait  Assessment & Plan:   Principal Problem:   Acute pulmonary embolism (HCC) Active Problems:   Cancer of lower-inner quadrant of left female breast ([place])   Acute, presumed recurrent Pulmonary embolus as evidenced by intermidiate probability  VQ scan. CXR negative for nodule, effusion consolidation. EKG without ACS changes. D dimer 0.62 VSS. Afebrile   Risk factors include h/o PE in 12/2014 treated until 05/2015 w Xarelto, h/o Breast Cancer treated w Tamoxifen until 05/2015, Obesity, sedentary lifestyle and family history of clotting disorder. Poss mediastinal soft tissue abnormality whaich she did not follow up with CVTS. Prior hypercoagulable panel in 12/2014 was negative for mutation. PLatelets normal at 263k - d/c lovenox - resume xarelto -Doppler LE pending.  If positive, patient will obviously need anticoagulation. If negative, then we will discuss with radiology the discrepancy between the VQ scan in the CT of the chest and whether or not patient needs anticoagulation. 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 Lindi Adie after discharge for anticoagulation issues  -  Troponin negative and BNP marginally elevated but not consistent with right heart strain  "Pre"Diabetes Current blood sugar level is 92 Hgb A1C Heart healthy carb modified diet. No other intervention is indicated at this time    DVT prophylaxis:  Xarelto Code Status:  Full code Family Communication:  Patient alone Disposition Plan:  Pending duplex lower extremities, discussion with radiology regarding probability of pulmonary embolism   Consultants:   None  Procedures:  VQ scan CT chest  Antimicrobials:   None    Subjective: Patient states that she has had chronic pain in her calves for many years which has not changed recently. She denies acute worsening of pain, swelling, redness or weakness of her calves. The chest discomfort that she had seem similar to when she had her previous pulmonary embolism however this is resolving.  Objective: Filed Vitals:   08/25/15 1444 08/25/15 2126 08/26/15 0543 08/26/15 1359  BP: 125/86 121/72 118/78  111/62  Pulse: 71 77 70 73  Temp: 99.1 F (37.3 C) 98.9 F (37.2 C) 98.7 F (37.1 C) 98.3 F (36.8 C)  TempSrc: Oral Oral  Oral  Resp: 17 17 16 16   SpO2: 100% 100% 100% 100%    Intake/Output Summary (Last 24 hours) at 08/26/15 1837 Last data filed at 08/26/15 1430  Gross per 24 hour  Intake    720 ml  Output      0 ml  Net    720 ml   There were no vitals filed for this visit.  Examination:  General exam:  Adult Female.  No acute distress.  HEENT:  NCAT, MMM Respiratory system: Clear to auscultation bilaterally Cardiovascular system: Regular rate and rhythm, normal S1/S2. No murmurs, rubs, gallops or clicks.  Warm extremities Gastrointestinal system: Normal active bowel sounds, soft, nondistended, nontender. MSK:  Normal tone and bulk, no lower extremity edema Neuro:  Grossly intact    Data Reviewed: I have personally reviewed following labs and imaging studies  CBC:  Recent Labs Lab 08/23/15 1640 08/26/15 0536  WBC 8.2 5.9  HGB 12.3 11.0*  HCT 37.0 33.7*  MCV 89.6 89.4  PLT 263 0000000   Basic Metabolic Panel:  Recent Labs Lab 08/23/15 1640 08/26/15 0536  NA 134* 135  K 3.6 3.3*  CL 105 103  CO2 23 24  GLUCOSE 92 97  BUN 12 8  CREATININE 0.91 0.79  CALCIUM 9.3 8.7*   GFR: Estimated Creatinine Clearance: 108.4 mL/min (by C-G formula based on Cr of 0.79). Liver Function Tests:  Recent Labs Lab 08/26/15 0536  AST 15  ALT 11*  ALKPHOS 43  BILITOT 0.7  PROT 6.6  ALBUMIN 3.0*   No results for input(s): LIPASE, AMYLASE in the last 168 hours. No results for input(s): AMMONIA in the last 168 hours. Coagulation Profile:  Recent Labs Lab 08/25/15 1441  INR 1.06   Cardiac Enzymes:  Recent Labs Lab 08/26/15 1045  TROPONINI <0.03   BNP (last 3 results) No results for input(s): PROBNP in the last 8760 hours. HbA1C: No results for input(s): HGBA1C in the last 72 hours. CBG:  Recent Labs Lab 08/23/15 1623  GLUCAP 88   Lipid  Profile:  Recent Labs  08/26/15 0536  CHOL 170  HDL 37*  LDLCALC 116*  TRIG 86  CHOLHDL 4.6   Thyroid Function Tests: No results for input(s): TSH, T4TOTAL, FREET4, T3FREE, THYROIDAB in the last 72 hours. Anemia Panel: No results for input(s): VITAMINB12, FOLATE, FERRITIN, TIBC, IRON, RETICCTPCT in the last 72 hours. Urine analysis:    Component Value Date/Time   COLORURINE YELLOW 08/23/2015 1801   APPEARANCEUR TURBID* 08/23/2015 1801   LABSPEC 1.036* 08/23/2015 1801   PHURINE 5.5 08/23/2015 1801   GLUCOSEU NEGATIVE 08/23/2015 1801   HGBUR NEGATIVE 08/23/2015 1801   BILIRUBINUR NEGATIVE 08/23/2015 1801   KETONESUR 15* 08/23/2015 1801   PROTEINUR NEGATIVE 08/23/2015 1801   UROBILINOGEN 0.2 10/05/2013 1124   NITRITE NEGATIVE 08/23/2015 1801   LEUKOCYTESUR NEGATIVE 08/23/2015 1801   Sepsis Labs: @LABRCNTIP (procalcitonin:4,lacticidven:4)  )No results found for this or any previous visit (from the past 240 hour(s)).    Radiology Studies: Dg Chest 2 View  08/25/2015  CLINICAL DATA:  40 year old female with right-sided chest pain and shortness of breath. Past history of pulmonary emboli. EXAM: CHEST  2 VIEW COMPARISON:  Prior CTA of the chest  01/12/2015; prior chest x-ray also 01/12/2015 FINDINGS: The lungs are clear and negative for focal airspace consolidation, pulmonary edema or suspicious pulmonary nodule. No pleural effusion or pneumothorax. Cardiac and mediastinal contours are within normal limits. No acute fracture or lytic or blastic osseous lesions. The visualized upper abdominal bowel gas pattern is unremarkable. Surgical clips in the left breast and left axilla suggest prior lumpectomy and sentinel node dissection. IMPRESSION: No active cardiopulmonary disease. Electronically Signed   By: Jacqulynn Cadet M.D.   On: 08/25/2015 09:32   Ct Chest W Contrast  08/26/2015  CLINICAL DATA:  Followup mediastinal soft tissue density. Personal history of breast carcinoma and recent  pulmonary embolism. EXAM: CT CHEST WITH CONTRAST TECHNIQUE: Multidetector CT imaging of the chest was performed during intravenous contrast administration. CONTRAST:  39mL ISOVUE-300 IOPAMIDOL (ISOVUE-300) INJECTION 61% COMPARISON:  Chest CTA on 01/12/2015 FINDINGS: Cardiovascular: Previously seen bilateral pulmonary emboli are no longer visualized. Normal heart size. Mediastinum/Lymph Nodes: Persistent soft tissue density in the anterior mediastinum maintains a triangular configuration and is stable since previous study. This is consistent with thymic hyperplasia. No other masses or lymphadenopathy identified. Surgical clips again seen within the left axilla. No evidence of axillary lymphadenopathy. Lungs/Pleura: Both lungs are clear. No evidence of pulmonary mass or infiltrate. No evidence of pleural effusion. Upper abdomen: Both adrenal glands are normal in appearance. Left renal cyst again noted. Musculoskeletal: No suspicious bone lesions identified. IMPRESSION: Stable anterior mediastinal soft tissue density, most consistent with thymic hyperplasia. No definite lymphadenopathy or other metastatic disease identified within the thorax. Electronically Signed   By: Earle Gell M.D.   On: 08/26/2015 11:26   Nm Pulmonary Perf And Vent  08/25/2015  CLINICAL DATA:  40 year old female with a history of pulmonary embolism presents with chest pain. EXAM: NUCLEAR MEDICINE VENTILATION - PERFUSION LUNG SCAN TECHNIQUE: Ventilation images were obtained in multiple projections using inhaled aerosol Tc-22m DTPA. Perfusion images were obtained in multiple projections after intravenous injection of Tc-11m MAA. RADIOPHARMACEUTICALS:  31.3 mCi Technetium-62m DTPA aerosol inhalation and 4.1 mCi Technetium-39m MAA IV COMPARISON:  01/12/2015 chest CT angiogram and 08/25/2015 chest radiograph. FINDINGS: Ventilation: No focal ventilation defect. Perfusion: Large wedge-shaped unmatched perfusion defect in the mid to upper anterior  right lung. No additional perfusion defects. IMPRESSION: Large unmatched perfusion defect in the mid to upper anterior right lung. Intermediate probability for pulmonary embolism (20-79%). These results were called by telephone at the time of interpretation on 08/25/2015 at 11:52 am to Dr. Winfred Leeds in the [facility] ED, who verbally acknowledged these results. The patient was asked to proceed to the [facility] ED for further management. Electronically Signed   By: Ilona Sorrel M.D.   On: 08/25/2015 11:54     Scheduled Meds: . rivaroxaban  15 mg Oral BID WC   Followed by  . [START ON 09/17/2015] rivaroxaban  20 mg Oral Q supper   Continuous Infusions:       Time spent: 30 min    Janece Canterbury, MD [facility] Pager [phone]  If 7PM-7AM, please contact night-coverage [email or web address] Password [facility] 08/26/2015, 6:37 PM

## 2015-08-26 NOTE — Telephone Encounter (Signed)
no answer....mailed pt appt sched and letter °

## 2015-08-27 ENCOUNTER — Observation Stay (HOSPITAL_BASED_OUTPATIENT_CLINIC_OR_DEPARTMENT_OTHER): Payer: Medicaid Other

## 2015-08-27 DIAGNOSIS — E876 Hypokalemia: Secondary | ICD-10-CM

## 2015-08-27 DIAGNOSIS — I2699 Other pulmonary embolism without acute cor pulmonale: Secondary | ICD-10-CM

## 2015-08-27 DIAGNOSIS — C50312 Malignant neoplasm of lower-inner quadrant of left female breast: Secondary | ICD-10-CM | POA: Diagnosis not present

## 2015-08-27 DIAGNOSIS — I82409 Acute embolism and thrombosis of unspecified deep veins of unspecified lower extremity: Secondary | ICD-10-CM

## 2015-08-27 LAB — HEMOGLOBIN A1C
Hgb A1c MFr Bld: 5.6 % (ref 4.8–5.6)
MEAN PLASMA GLUCOSE: 114 mg/dL

## 2015-08-27 MED ORDER — HYDROCODONE-ACETAMINOPHEN 5-325 MG PO TABS: 1.0000 | ORAL_TABLET | Freq: Four times a day (QID) | ORAL | Status: DC | PRN

## 2015-08-27 MED ORDER — RIVAROXABAN (XARELTO) VTE STARTER PACK (15 & 20 MG): ORAL_TABLET | ORAL | Status: DC

## 2015-08-27 MED ORDER — FERROUS SULFATE 325 (65 FE) MG PO TABS: 325.0000 mg | ORAL_TABLET | Freq: Every day | ORAL | Status: DC

## 2015-08-27 NOTE — Care Management Note (Addendum)
Case Management Note  Patient Details  Name: Janice Crawford MRN: [id number] Date of Birth: 11-13-1975  Subjective/Objective:    Pt admitted on 08/25/15 with acute PE.  PTA, pt independent of ADLS.   CM Referral for Xarelto assistance.                 Action/Plan: Met with pt to discuss Xarelto.  Pt has Medicaid of [place]; will be able to obtain Xarelto with $3-4 copay.  30 day free trial card for Xarelto given to pt for first Rx.  Pt states she has many questions for her physician.  Bedside nurse to notify MD.    Expected Discharge Date:    08/27/2015           Expected Discharge Plan:  Home/Self Care  In-House Referral:     Discharge planning Services  CM Consult, Medication Assistance  Post Acute Care Choice:    Choice offered to:     DME Arranged:    DME Agency:     HH Arranged:    HH Agency:     Status of Service:  Completed, signed off  If discussed at [employer] of Stay Meetings, dates discussed:    Additional Comments:  Reinaldo Raddle, RN, BSN  Trauma/Neuro ICU Case Manager [phone]

## 2015-08-27 NOTE — Progress Notes (Signed)
Janice Crawford to be D/C'd  per MD order. Discussed with the patient and all questions fully answered.  VSS, Skin clean, dry and intact without evidence of skin break down, no evidence of skin tears noted.  IV catheter discontinued intact. Site without signs and symptoms of complications. Dressing and pressure applied.  An After Visit Summary was printed and given to the patient. Patient received prescription.  D/c education completed with patient/family including follow up instructions, medication list, d/c activities limitations if indicated, with other d/c instructions as indicated by MD - patient able to verbalize understanding, all questions fully answered.   Patient instructed to return to ED, call 911, or call MD for any changes in condition.   Patient to be escorted via [place], and D/C home via private auto.

## 2015-08-27 NOTE — Discharge Summary (Addendum)
Physician Discharge Summary  Janice Crawford TDD:[id number] DOB: [birth year] DOA: 08/25/2015  PCP: Abby Potash, PA-C  Admit date: 08/25/2015 Discharge date: 08/27/2015  Admitted From: home  Disposition:  home  Recommendations for Outpatient Follow-up:  1. Follow up with PCP in 1-2 weeks 2. Please obtain BMP/CBC in one week 3. F/u with Dr. Lindi Adie in 2 weeks for acute pulmonary embolism 4. Xarelto dose to reduce on 09/17/2015  Home Health:  none  Equipment/Devices:  none  Discharge Condition:  Stable, improved CODE STATUS:  full  Diet recommendation:  regular   Brief/Interim Summary:  Janice Crawford is a 40 y.o. 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 She "knew" these symptoms were similar to her prior PE. At the time, a CT angiogram was planned, but was not successful, as the IV catheter extravasated.  VQ study demonstrated intermediate risk for RUL pulmonary embolism and she was started on anticoagulation with lovenox pending further testing.  Pro-BNP was only minimally elevated and troponin was negative. she remained on room air and did not have tachycardia. Duplex of the lower extremities demonstrated a non occlusive age indeterminate DVT in the left popliteal vein.  Because of her previous mediastinal mass, a repeat CT chest was performed that demonstrated stability of the mass suggestive of thymic hyperplasia and is less concerning for malignancy.  CT chest also did not see evidence of thrombus per the report.  I discussed the findings with the radiologist  on call who stated that because this was nto a CT angio protocol exam and there was some artifact, he could only exclude thrombus in the main pulmonary artery and segmental branches.  It was not a good enough study to exclude PE.  Patient met with the case manager and was given a 30-day card for xarelto and a prescription for a starter pack.   Discharge Diagnoses:  Principal Problem:   Acute pulmonary embolism ([place]) Active Problems:   Cancer of lower-inner quadrant of left female breast ([place])   Hypokalemia   DVT, lower extremity ([facility])  Acute pulmonary embolus.  Risk factors include h/o PE in 12/2014 treated until 05/2015 w Xarelto, h/o Breast Cancer treated w Tamoxifen until 05/2015, obesity, sedentary lifestyle and family history of clotting disorder.   Prior hypercoagulable panel in 12/2014 was negative for mutation.  CXR was negative for nodule, effusion, or consolidation. D dimer 0.62.  VQ was indeterminate probability.  EKG without ACS changes. Troponin was negative.  She was discharged with a xarelto starter pack.  Follow up with Heme Onc, Dr. Lindi Adie after discharge for anticoagulation management.  Left lower extremity DVT, age indeterminate.  Xarelto as above.  Thymic hyperplasia.  Mediastinal soft tissue abnormality was identified in the fall and she was referred to CT surgery but she did not follow up.  CT chest during this admission demonstrated a stable anterior mediastinal soft tissue density.    Normocytic anemia, likely anemia of chronic disease, defer to oncologist.   "Pre"Diabetes A1c 5.6.     Hx of breast cancer, no longer on tamoxifen.  Follow up with Dr. Lindi Adie for surveillance  Hypokalemia, given oral potassium repletion.  Discharge Instructions      Discharge Instructions    Call MD for:  difficulty breathing, headache or visual disturbances    Complete by:  As directed      Call MD for:  extreme fatigue    Complete by:  As directed      Call MD for:  hives     Complete by:  As directed      Call MD for:  persistant dizziness or light-headedness    Complete by:  As directed      Call MD for:  persistant nausea and vomiting    Complete by:  As directed      Call MD for:  severe uncontrolled pain    Complete by:  As directed      Call MD for:  temperature >100.4    Complete by:  As directed      Diet - low sodium heart healthy    Complete by:  As directed      Discharge instructions    Complete by:  As directed   If you are unable to obtain your blood thinner, please call the nursing station at [phone] or call the office at [phone] for assistance.  If you are still unable to obtain an anticoagulant, please return to the emergency department.  If you develop worsening chest pains, shortness of breath or have fainting spells, seek immediate medical attention.     Driving Restrictions    Complete by:  As directed   No driving or operating heavy machinery while taking hydrocodone.     Increase activity slowly    Complete by:  As directed             Medication List    STOP taking these medications        rivaroxaban 20 MG Tabs tablet  Commonly known as:  XARELTO  Replaced by:  Rivaroxaban 15 & 20 MG Tbpk      TAKE these medications        ferrous sulfate 325 (65 FE) MG tablet  Take 1 tablet (325 mg total) by mouth daily with breakfast.     HYDROcodone-acetaminophen 5-325 MG tablet  Commonly known as:  NORCO/VICODIN  Take 1 tablet by mouth every 6 (six) hours as needed for severe pain.     NONFORMULARY OR COMPOUNDED ITEM  Outpatient physical therapy, eval and treat Ind:  Generalized weakness, deconditioning, breast cancer, pulmonary embolism     Rivaroxaban 15 & 20 MG Tbpk  Take as directed on package: Start with one '15mg'$  tablet by mouth twice a day with food. On Day 22, July 24th, switch to one '20mg'$  tablet once a day with food.       Follow-up Information    Follow up with Abby Potash, PA-C. Schedule an appointment  as soon as possible for a visit in 1 week.   Specialty:  Physician Assistant   Why:  As needed      Follow up with Rulon Eisenmenger, MD. Schedule an appointment as soon as possible for a visit in 2 weeks.   Specialty:  Hematology and Oncology   Contact information:   [place] [address] [phone]      Allergies  Allergen Reactions  . Latex Hives    Consultations: none    Procedures/Studies: Dg Chest 2 View  08/25/2015  CLINICAL DATA:  40 year old female with right-sided chest pain and shortness of breath. Past history of pulmonary emboli. EXAM: CHEST  2 VIEW COMPARISON:  Prior CTA of the chest 01/12/2015; prior chest x-ray also 01/12/2015 FINDINGS: The lungs are clear and negative for focal airspace consolidation, pulmonary edema or suspicious pulmonary nodule. No pleural effusion or pneumothorax. Cardiac and mediastinal contours are within normal limits. No acute fracture or lytic or blastic osseous lesions. The visualized upper abdominal bowel gas pattern is unremarkable. Surgical clips in the left breast and left axilla suggest prior lumpectomy and sentinel node dissection. IMPRESSION: No active cardiopulmonary disease. Electronically Signed   By: Jacqulynn Cadet M.D.   On: 08/25/2015 09:32   Ct Chest W Contrast  08/26/2015  CLINICAL DATA:  Followup mediastinal soft tissue density. Personal history of breast carcinoma and recent pulmonary embolism. EXAM: CT CHEST WITH CONTRAST TECHNIQUE: Multidetector CT imaging of the chest was performed during intravenous contrast administration. CONTRAST:  32m ISOVUE-300 IOPAMIDOL (ISOVUE-300) INJECTION 61% COMPARISON:  Chest CTA on 01/12/2015 FINDINGS: Cardiovascular: Previously seen bilateral pulmonary emboli are no longer visualized. Normal heart size. Mediastinum/Lymph Nodes: Persistent soft tissue density in the anterior mediastinum maintains a triangular configuration and is stable since previous study. This is consistent  with thymic hyperplasia. No other masses or lymphadenopathy identified. Surgical clips again seen within the left axilla. No evidence of axillary lymphadenopathy. Lungs/Pleura: Both lungs are clear. No evidence of pulmonary mass or infiltrate. No evidence of pleural effusion. Upper abdomen: Both adrenal glands are normal in appearance. Left renal cyst again noted. Musculoskeletal: No suspicious bone lesions identified. IMPRESSION: Stable anterior mediastinal soft tissue density, most consistent with thymic hyperplasia. No definite lymphadenopathy or other metastatic disease identified within the thorax. Electronically Signed   By: JEarle GellM.D.   On: 08/26/2015 11:26   Nm Pulmonary Perf And Vent  08/25/2015  CLINICAL DATA:  40year old female with a history of pulmonary embolism presents with chest pain. EXAM: NUCLEAR MEDICINE VENTILATION - PERFUSION LUNG SCAN TECHNIQUE: Ventilation images were obtained in multiple projections using inhaled aerosol Tc-970mTPA. Perfusion images were obtained in multiple projections after intravenous injection of Tc-9979mA. RADIOPHARMACEUTICALS:  31.3 mCi Technetium-1m83mA aerosol inhalation and 4.1 mCi Technetium-1m 93mIV COMPARISON:  01/12/2015 chest CT angiogram and 08/25/2015 chest radiograph. FINDINGS: Ventilation: No focal ventilation defect. Perfusion: Large wedge-shaped unmatched perfusion defect in the mid to upper anterior right lung. No additional perfusion defects. IMPRESSION: Large unmatched perfusion defect in the mid to upper anterior right lung. Intermediate probability for pulmonary embolism (20-79%). These results were called by telephone at the time of interpretation on 08/25/2015 at 11:52 am to Dr. JACUBWinfred Leedshe [facility] verbally acknowledged these results. The patient was [facility]. Electronically Signed   By: JasonIlona Sorrel   On: 08/25/2015 11:54    Duplex lower extremities, nonocclusive, age  indeterminate DVT in the left popliteal vein.   Subjective: Feeling well and would like to go home today.  Has used xarelto in the past and was able to afford the medication.    Discharge Exam: Filed Vitals:   08/27/15 0600 08/27/15 1425  BP: 124/81 120/77  Pulse: 73 80  Temp: 98.7 F (37.1 C) 98.6 F (37 C)  Resp: 16 16   Filed Vitals:   08/26/15 2059 08/27/15 0600 08/27/15 0824 08/27/15 1425  BP: 122/90 124/81  120/77  Pulse: 90 73  80  Temp: 98.7 F (37.1 C) 98.7 F (37.1 C)  98.6 F (37 C)  TempSrc: Oral Oral  Oral  Resp: 16 16  16  Height:   '5\' 4"'$  (1.626 m)   Weight:   97 kg (213 lb 13.5 oz)   SpO2: 100% 100%  100%   General exam: Adult Female. No acute distress.  HEENT: NCAT, MMM Respiratory system: Clear to auscultation bilaterally Cardiovascular system: Regular rate and rhythm, normal S1/S2. No murmurs, rubs, gallops or clicks. Warm extremities Gastrointestinal system: Normal active bowel sounds, soft, nondistended, nontender. MSK: Normal tone and bulk, no lower extremity edema Neuro: Grossly intact    The results of significant diagnostics from this hospitalization (including imaging, microbiology, ancillary and laboratory) are listed below for reference.     Microbiology: No results found for this or any previous visit (from the past 240 hour(s)).   Labs: BNP (last 3 results)  Recent Labs  08/26/15 1045  BNP 66.5   Basic Metabolic Panel:  Recent Labs Lab 08/23/15 1640 08/26/15 0536  NA 134* 135  K 3.6 3.3*  CL 105 103  CO2 23 24  GLUCOSE 92 97  BUN 12 8  CREATININE 0.91 0.79  CALCIUM 9.3 8.7*   Liver Function Tests:  Recent Labs Lab 08/26/15 0536  AST 15  ALT 11*  ALKPHOS 43  BILITOT 0.7  PROT 6.6  ALBUMIN 3.0*   No results for input(s): LIPASE, AMYLASE in the last 168 hours. No results for input(s): AMMONIA in the last 168 hours. CBC:  Recent Labs Lab 08/23/15 1640 08/26/15 0536  WBC 8.2 5.9  HGB 12.3 11.0*   HCT 37.0 33.7*  MCV 89.6 89.4  PLT 263 261   Cardiac Enzymes:  Recent Labs Lab 08/26/15 1045  TROPONINI <0.03   BNP: Invalid input(s): POCBNP CBG:  Recent Labs Lab 08/23/15 1623  GLUCAP 88   D-Dimer No results for input(s): DDIMER in the last 72 hours. Hgb A1c  Recent Labs  08/25/15 1441  HGBA1C 5.6   Lipid Profile  Recent Labs  08/26/15 0536  CHOL 170  HDL 37*  LDLCALC 116*  TRIG 86  CHOLHDL 4.6   Thyroid function studies No results for input(s): TSH, T4TOTAL, T3FREE, THYROIDAB in the last 72 hours.  Invalid input(s): FREET3 Anemia work up No results for input(s): VITAMINB12, FOLATE, FERRITIN, TIBC, IRON, RETICCTPCT in the last 72 hours. Urinalysis    Component Value Date/Time   COLORURINE YELLOW 08/23/2015 1801   APPEARANCEUR TURBID* 08/23/2015 1801   LABSPEC 1.036* 08/23/2015 1801   PHURINE 5.5 08/23/2015 1801   GLUCOSEU NEGATIVE 08/23/2015 1801   HGBUR NEGATIVE 08/23/2015 1801   BILIRUBINUR NEGATIVE 08/23/2015 1801   KETONESUR 15* 08/23/2015 1801   PROTEINUR NEGATIVE 08/23/2015 1801   UROBILINOGEN 0.2 10/05/2013 1124   NITRITE NEGATIVE 08/23/2015 1801   LEUKOCYTESUR NEGATIVE 08/23/2015 1801   Sepsis Labs Invalid input(s): PROCALCITONIN,  WBC,  LACTICIDVEN Microbiology No results found for this or any previous visit (from the past 240 hour(s)).   Time coordinating discharge: Over 30 minutes  SIGNED:   Janece Canterbury, MD  [facility] 08/27/2015, 5:39 PM Pager   If 7PM-7AM, please contact night-coverage [email or web address] Password [email or web address]

## 2015-09-07 ENCOUNTER — Ambulatory Visit: Payer: Medicaid Other | Admitting: Hematology and Oncology

## 2015-09-07 NOTE — Assessment & Plan Note (Deleted)
Left lumpectomy 04/19/2014: IDC grade 2, 1.5 cm, intermediate grade DCIS, 0/2 lymph nodes, ER 90%, PR 90%, HER-2 negative ratio 1.33, Ki-67 20%, Oncotype DX recurrence score 23, 15% ROR Right lumpectomy: Fibrocystic changes no malignancy. 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  Mammogram 03/16/2015: Benign postsurgical changes, breast density category C Patient has not had a breast MRI.

## 2015-09-07 NOTE — Assessment & Plan Note (Signed)
Acute pulmonary embolism diagnosed 01/12/2015 when she presented with substernal chest pain and shortness of breath and dizziness walking (distal right and left pulmonary arteries and all lobar branches) On xarelto  CT chest 01/12/2015: positive for pulmonary embolism with CT evidence of right heart strain , abnormal anterior mediastinal soft tissue density fevers thymic hyperplasia however mediastinal lymphadenopathy cannot be excluded definitively  Hypercoagulability panel did not reveal any abnormalities involving protein C, protein S, antithrombin III, 2 glycoprotein antibodies, anti-cardiolipin antibodies, factor V Leiden, factor VIII  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 CT chest was performed that did not show blood clot although it was not a CT angiogram. Currently back on xarelto.

## 2015-10-21 ENCOUNTER — Other Ambulatory Visit: Payer: Self-pay | Admitting: Internal Medicine

## 2015-10-30 ENCOUNTER — Other Ambulatory Visit: Payer: Self-pay | Admitting: Internal Medicine

## 2016-02-17 ENCOUNTER — Ambulatory Visit (HOSPITAL_COMMUNITY)
Admission: EM | Admit: 2016-02-17 | Discharge: 2016-02-17 | Disposition: A | Discharge status: Home / Self Care | Payer: Medicaid Other | Attending: Family Medicine | Admitting: Family Medicine

## 2016-02-17 ENCOUNTER — Encounter (HOSPITAL_COMMUNITY): Payer: Self-pay | Admitting: *Deleted

## 2016-02-17 DIAGNOSIS — J069 Acute upper respiratory infection, unspecified: Secondary | ICD-10-CM

## 2016-02-17 MED ORDER — IPRATROPIUM BROMIDE 0.06 % NA SOLN: 2.0000 | Freq: Four times a day (QID) | NASAL | 1 refills | Status: DC

## 2016-02-17 MED ORDER — HYDROCOD POLST-CPM POLST ER 10-8 MG/5ML PO SUER
5.0000 mL | Freq: Two times a day (BID) | ORAL | 0 refills | Status: DC | PRN
Start: 2016-02-17 — End: 2018-07-06
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
## 2016-02-17 NOTE — Discharge Instructions (Signed)
Drink plenty of fluids as discussed, use medicine as prescribed, and mucinex or delsym for cough. Return or see your doctor if further problems °

## 2016-02-17 NOTE — ED Triage Notes (Signed)
Pt  Has  Symptoms  Of  Cough   /    Congestion        X   1   Week   With  Body  Aches  And  Not  Feeling  Well    Pt  Sitting  Upright on  Exam table  No  Severe   Distress    Daughter  Has  Similar  Symptoms

## 2016-02-21 ENCOUNTER — Emergency Department (HOSPITAL_COMMUNITY): Payer: No Typology Code available for payment source

## 2016-02-21 ENCOUNTER — Emergency Department (HOSPITAL_COMMUNITY)
Admission: EM | Admit: 2016-02-21 | Discharge: 2016-02-21 | Disposition: A | Discharge status: Home / Self Care | Payer: No Typology Code available for payment source | Attending: Emergency Medicine | Admitting: Emergency Medicine

## 2016-02-21 ENCOUNTER — Encounter (HOSPITAL_COMMUNITY): Payer: Self-pay | Admitting: *Deleted

## 2016-02-21 DIAGNOSIS — Z87891 Personal history of nicotine dependence: Secondary | ICD-10-CM | POA: Diagnosis not present

## 2016-02-21 DIAGNOSIS — S161XXA Strain of muscle, fascia and tendon at neck level, initial encounter: Secondary | ICD-10-CM | POA: Insufficient documentation

## 2016-02-21 DIAGNOSIS — Y92410 Unspecified street and highway as the place of occurrence of the external cause: Secondary | ICD-10-CM | POA: Diagnosis not present

## 2016-02-21 DIAGNOSIS — Y9389 Activity, other specified: Secondary | ICD-10-CM | POA: Insufficient documentation

## 2016-02-21 DIAGNOSIS — S199XXA Unspecified injury of neck, initial encounter: Secondary | ICD-10-CM | POA: Diagnosis present

## 2016-02-21 DIAGNOSIS — Z79899 Other long term (current) drug therapy: Secondary | ICD-10-CM | POA: Insufficient documentation

## 2016-02-21 DIAGNOSIS — S0990XA Unspecified injury of head, initial encounter: Secondary | ICD-10-CM | POA: Diagnosis not present

## 2016-02-21 DIAGNOSIS — Z853 Personal history of malignant neoplasm of breast: Secondary | ICD-10-CM | POA: Insufficient documentation

## 2016-02-21 DIAGNOSIS — Y999 Unspecified external cause status: Secondary | ICD-10-CM | POA: Diagnosis not present

## 2016-02-21 DIAGNOSIS — Z91040 Latex allergy status: Secondary | ICD-10-CM | POA: Diagnosis not present

## 2016-02-21 LAB — POC URINE PREG, ED: Preg Test, Ur: NEGATIVE

## 2016-02-21 MED ORDER — METHOCARBAMOL 500 MG PO TABS: 1000.0000 mg | ORAL_TABLET | Freq: Four times a day (QID) | ORAL | 0 refills | Status: AC

## 2016-02-21 MED ORDER — IBUPROFEN 600 MG PO TABS: 600.0000 mg | ORAL_TABLET | Freq: Four times a day (QID) | ORAL | 0 refills | Status: DC | PRN

## 2016-02-21 MED ORDER — IBUPROFEN 800 MG PO TABS
800.0000 mg | ORAL_TABLET | Freq: Once | ORAL | Status: AC
  Administered 2016-02-21: 800 mg via ORAL
  Filled 2016-02-21: qty 1
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
## 2016-06-03 IMAGING — MR MR LT BREAST BX W LOC DEV 1ST LESION IMAGE BX SPEC MR GUIDE
6 of 9 series · 33 of 48 positions shown · IV contrast (19ml Multihance)
Comparison: Previous exams.

ADDENDUM:
Pathology revealed grade II invasive mammary carcinoma and mammary
carcinoma in situ with lobular features in the left breast. The
right breast biopsy showed an intraductal papilloma for which
excision is recommended. This was found to be concordant by Dr. Destine
Ber[place]. Pathology was discussed with the patient by telephone. She
reported doing well after the biopsy with tenderness bilaterally and
minimal bruising. Post biopsy instructions were reviewed and her
questions were answered. Surgical consultation will be arranged by
[facility].

Pathology results reported by Sorin Oxendine RN, BSN on December 19, 2013.
CLINICAL DATA: Patient presents for MRI guided biopsy of a 1.1 cm
irregular enhancing mass over the inner lower left breast middle
depth. Positive family history of breast cancer in her mother
diagnosed at age 45.
EXAM:
MRI GUIDED CORE NEEDLE BIOPSY OF THE left BREAST
TECHNIQUE: Multiplanar, multisequence MR imaging of the left breast was
performed both before and after administration of intravenous
contrast.
CONTRAST:  19mL MULTIHANCE GADOBENATE DIMEGLUMINE 529 MG/ML IV SOLN

[Series 2: axial pre-cm · axial · non-contrast · 1.3mm · 0.91mm/px · z∈[-71,+115]mm · 6 of 144 slices shown]
[im 1/144]
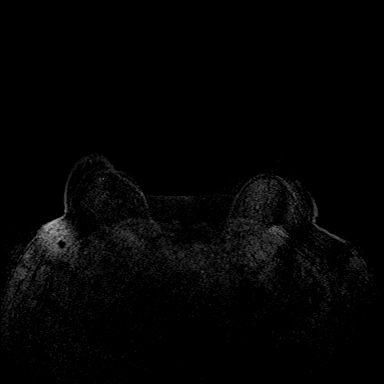
[im 29/144]
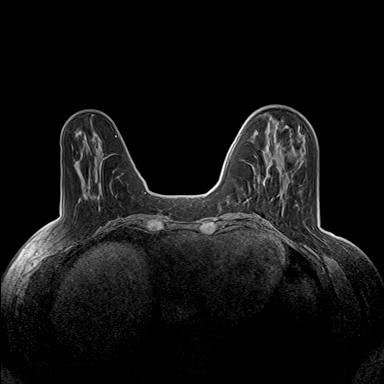
[im 58/144]
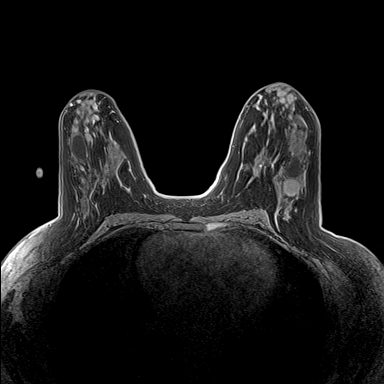
[im 86/144]
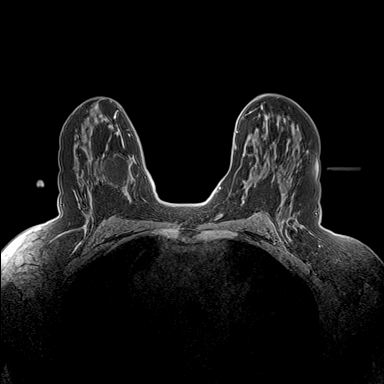
[im 115/144]
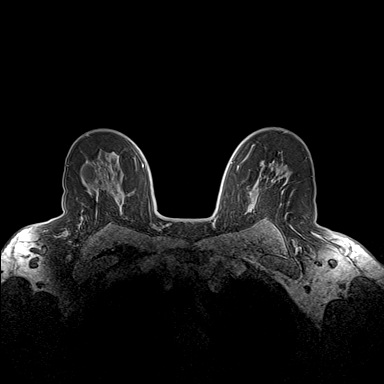
[im 144/144]
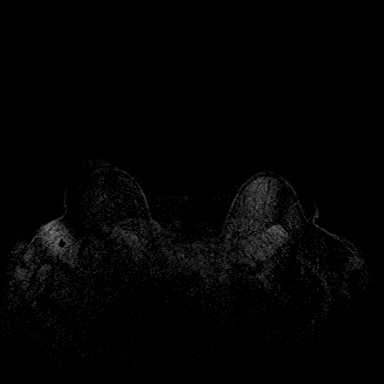

[Series 3: axial pre-cm_s2_(id) · axial · non-contrast · 1.3mm · 0.91mm/px · z∈[-71,+115]mm · 6 of 144 slices shown]
[im 1/144]
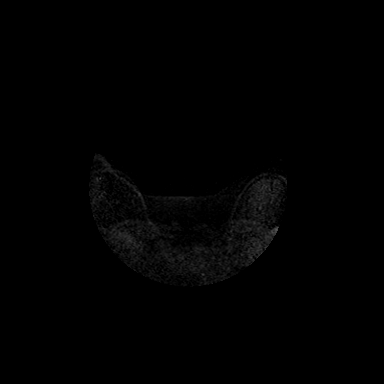
[im 29/144]
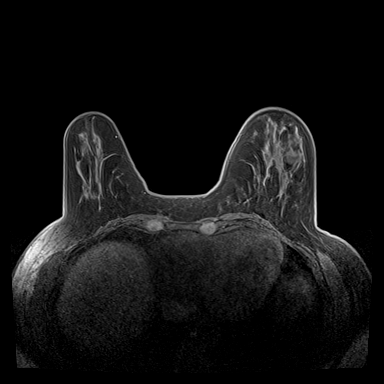
[im 58/144]
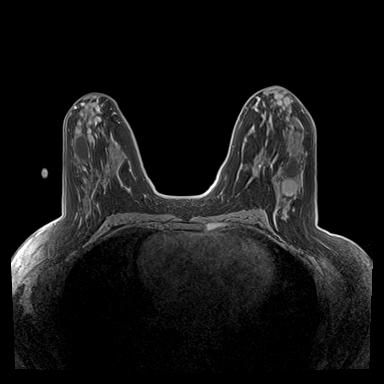
[im 86/144]
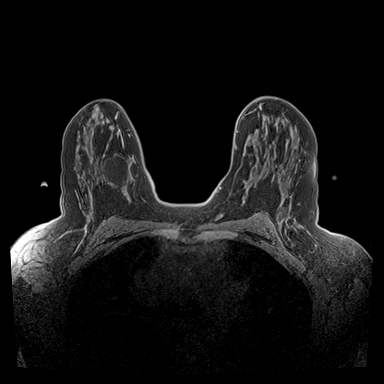
[im 115/144]
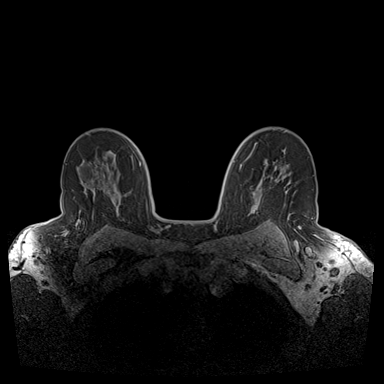
[im 144/144]
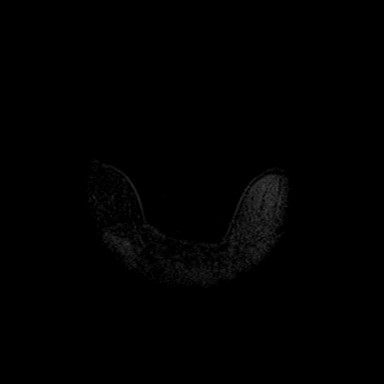

[Series 4: axial post 20 · axial · 1.3mm · 0.91mm/px · z∈[-71,+115]mm · 6 of 144 slices shown (1 of 3)]
[im 1/144]
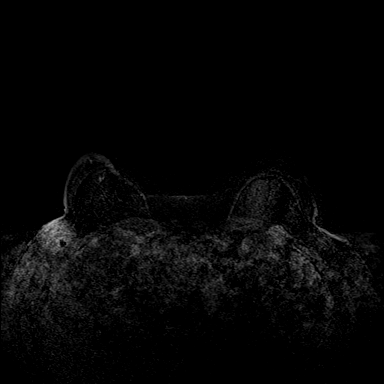
[im 29/144]
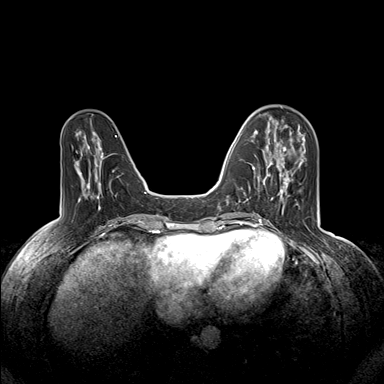
[im 58/144]
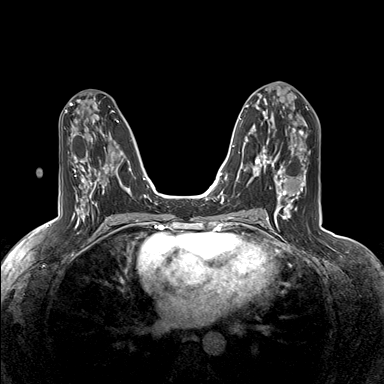
[im 86/144]
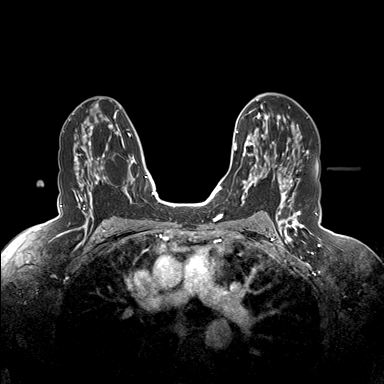
[im 115/144]
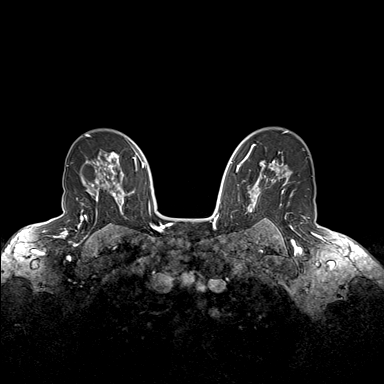
[im 144/144]
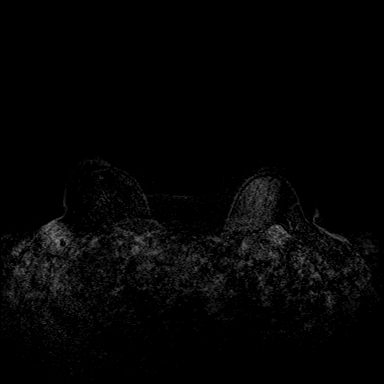

[Series 5: axial post 20 · axial · 1.3mm · 0.91mm/px · z∈[-71,+115]mm · 5 of 144 slices shown (2 of 3)]
[im 1/144]
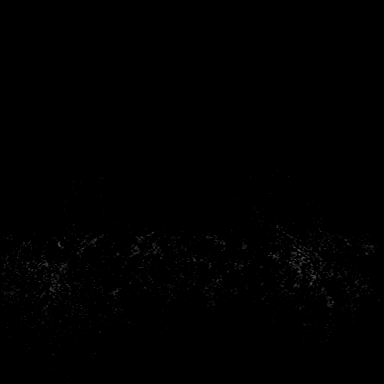
[im 36/144]
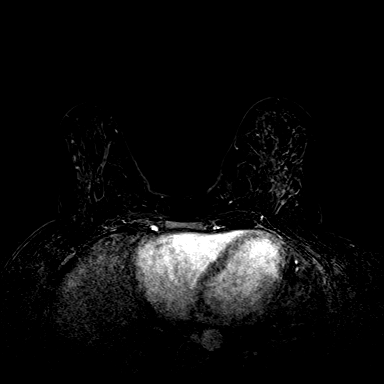
[im 72/144]
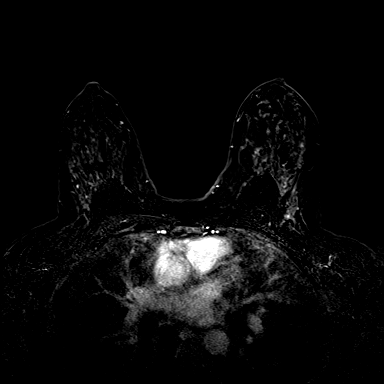
[im 108/144]
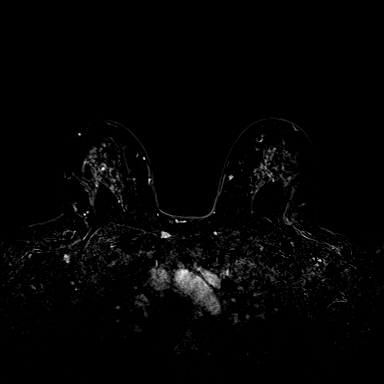
[im 144/144]
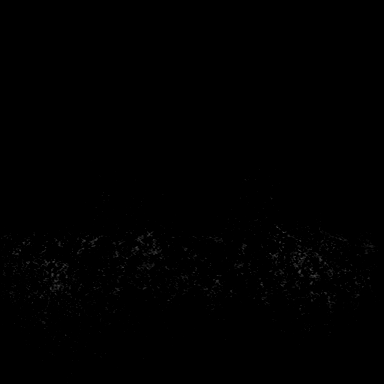

[Series 6: axial post 20 · axial · 1.3mm · 0.91mm/px · z∈[-71,+115]mm · 5 of 144 slices shown (3 of 3)]
[im 1/144]
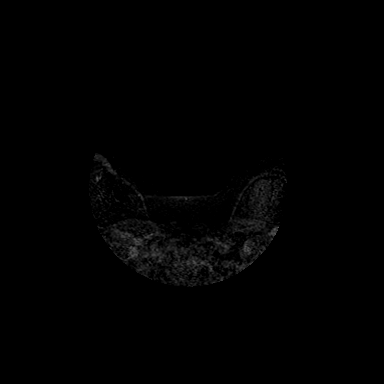
[im 36/144]
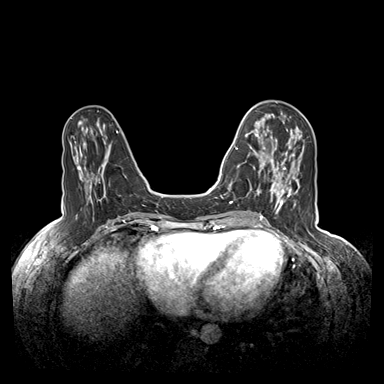
[im 72/144]
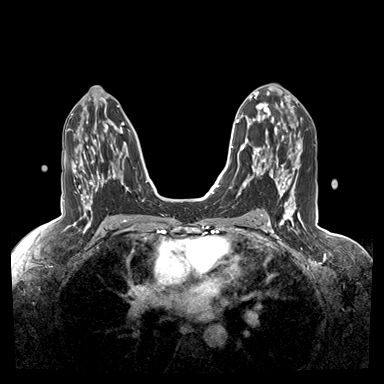
[im 108/144]
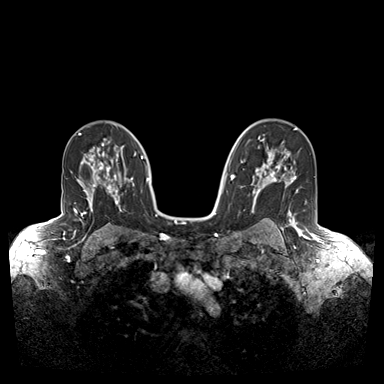
[im 144/144]
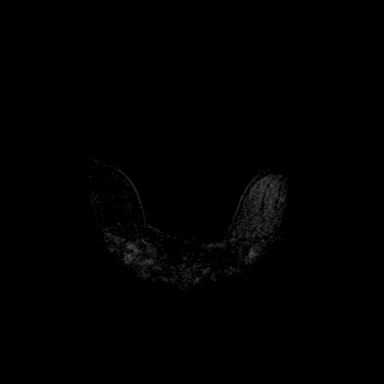

[Series 7: axial confirmation · axial · 1.3mm · 0.91mm/px · z∈[-71,+115]mm · 5 of 144 slices shown]
[im 1/144]
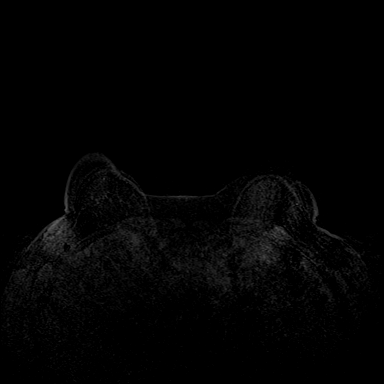
[im 36/144]
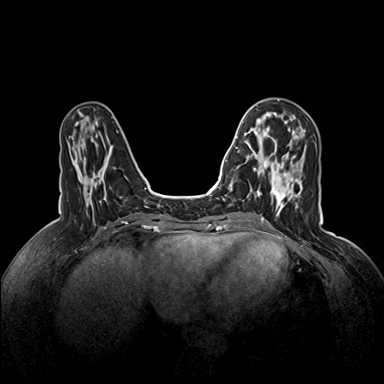
[im 72/144]
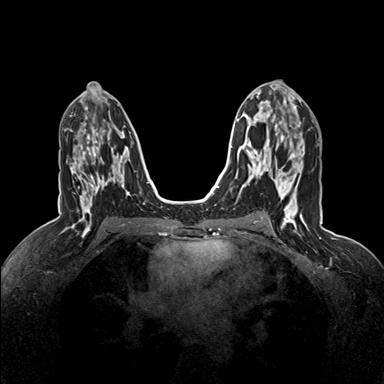
[im 108/144]
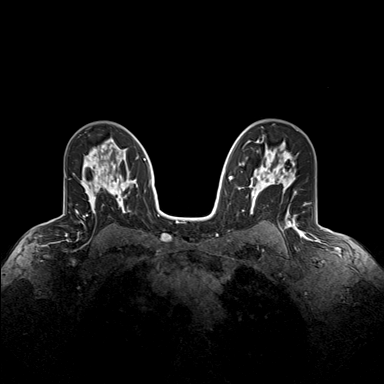
[im 144/144]
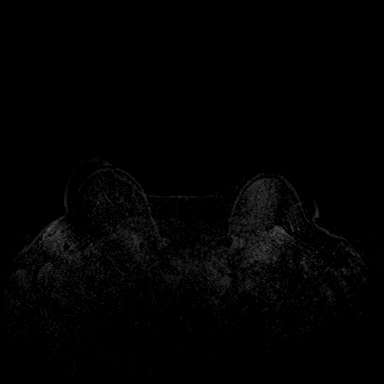

[33 of 48 positions shown; findings below may reference images not displayed]

FINDINGS: I met with the patient, and we discussed the procedure of MRI guided
biopsy, including risks, benefits, and alternatives. Specifically,
we discussed the risks of infection, bleeding, tissue injury, clip
migration, and inadequate sampling. Informed, written consent was
given. The usual time out protocol was performed immediately prior
to the procedure.

Using sterile technique, 2% Lidocaine, MRI guidance, and a 9 gauge
vacuum assisted device, biopsy was performed of the targeted
irregular enhancing mass over the inner lower left breast using a
lateral to medial approach. Patient tolerated the procedure well
without complications. Eight core tissue specimens were obtained. At
the conclusion of the procedure, a dumbbell-shaped tissue marker
clip was deployed into the biopsy cavity. Follow-up 2-view mammogram
was performed and dictated separately.
IMPRESSION: MRI guided biopsy of a 1.1 cm irregular enhancing left breast mass.
No apparent complications.

## 2017-06-15 ENCOUNTER — Telehealth: Payer: Self-pay | Admitting: Hematology and Oncology

## 2017-06-15 NOTE — Telephone Encounter (Signed)
Patient called to schedule an appointment. 

## 2017-06-30 IMAGING — CT CT ANGIO CHEST
2 of 6 series · 18 of 36 positions shown · IV contrast (Omni 300)
Comparison: None.

CLINICAL DATA: Progressive chest pain and shortness of breath for
past 5 days. Breast carcinoma.

EXAM:
CT ANGIOGRAPHY CHEST WITH CONTRAST
TECHNIQUE: Multidetector CT imaging of the chest was performed using the
standard protocol during bolus administration of intravenous
contrast. Multiplanar CT image reconstructions and MIPs were
obtained to evaluate the vascular anatomy.
CONTRAST:  100mL OMNIPAQUE IOHEXOL 350 MG/ML SOLN

[Series 6: pe thins · axial · 0.66mm/px · z∈[-267,-13]mm · 17 of 561 slices shown]
[im 27/561  lung]
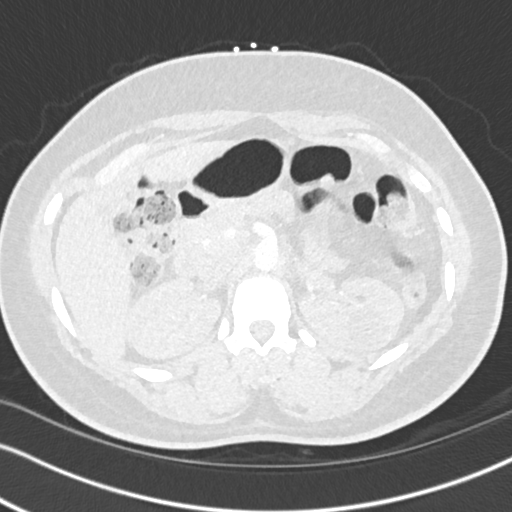
[im 54/561  mediastinal]
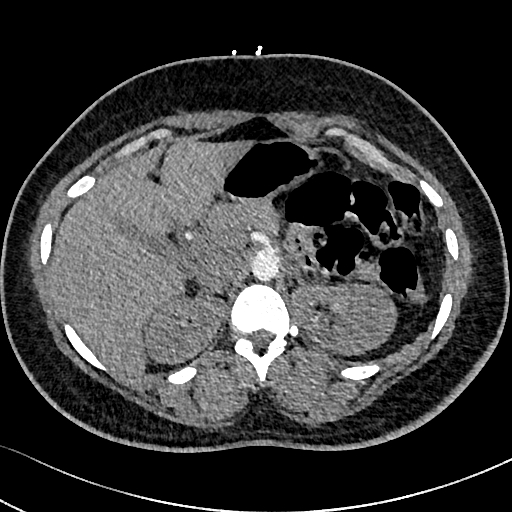
[im 81/561  lung]
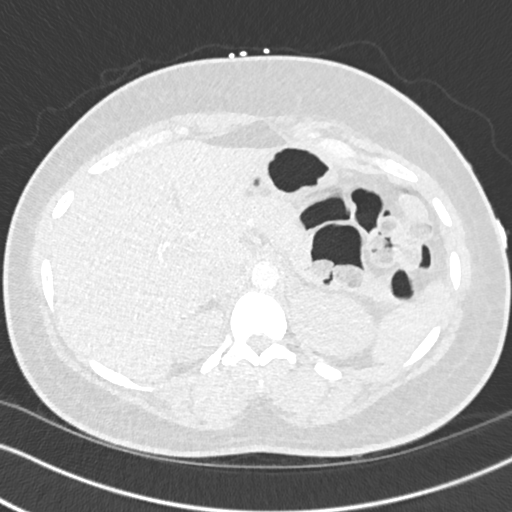
[im 134/561  mediastinal]
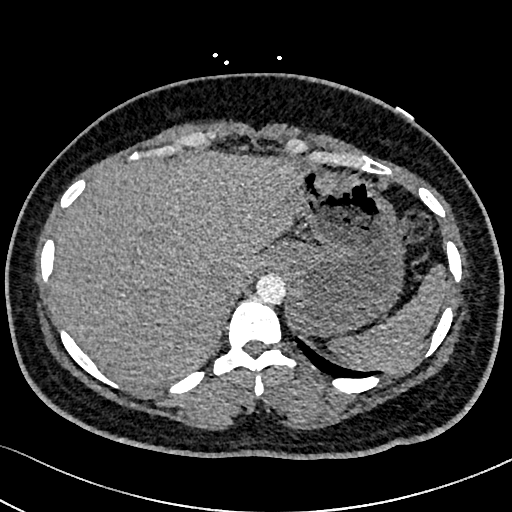
[im 161/561  lung]
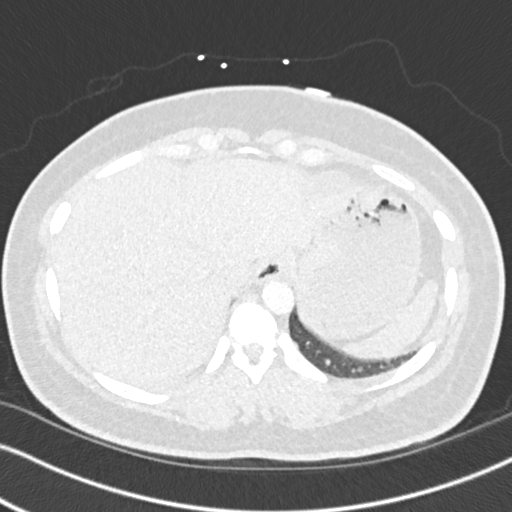
[im 187/561  mediastinal]
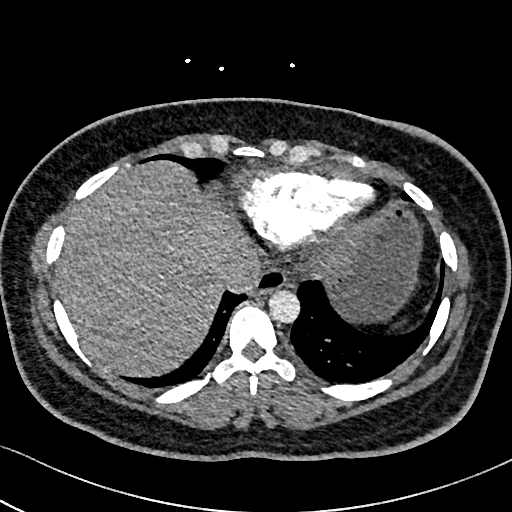
[im 214/561  lung]
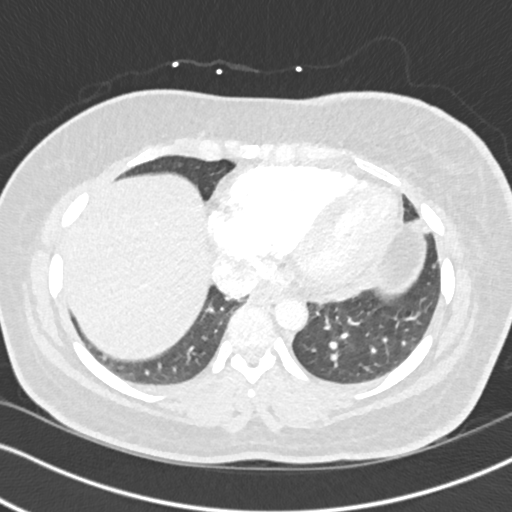
[im 241/561  mediastinal]
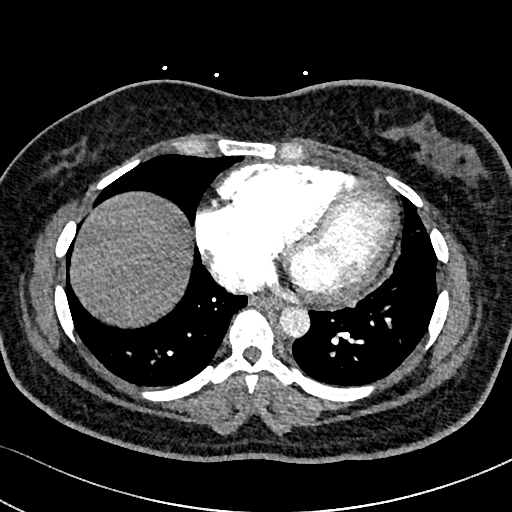
[im 294/561  lung]
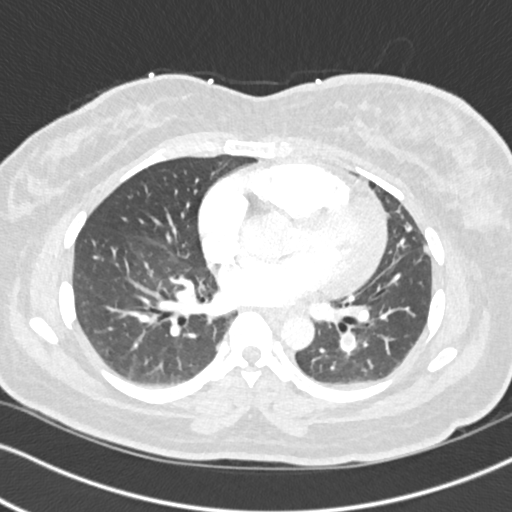
[im 321/561  mediastinal]
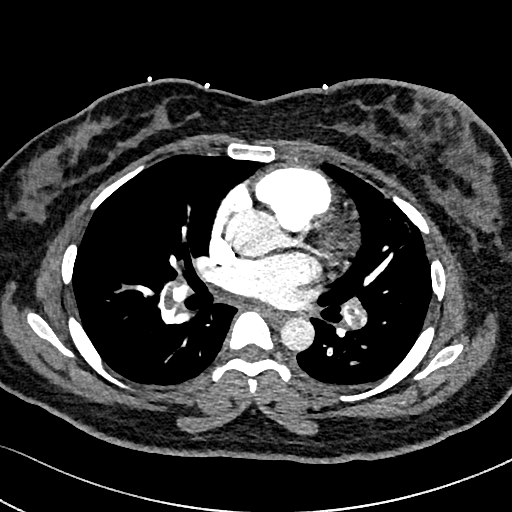
[im 347/561  lung]
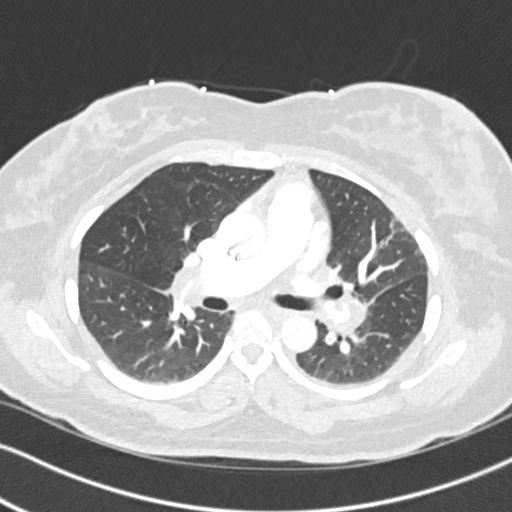
[im 374/561  mediastinal]
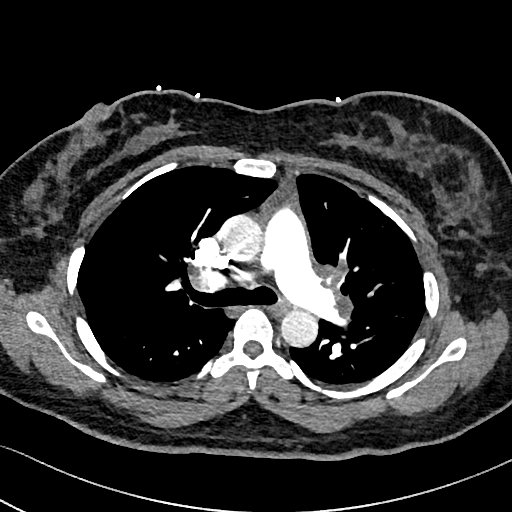
[im 401/561  lung]
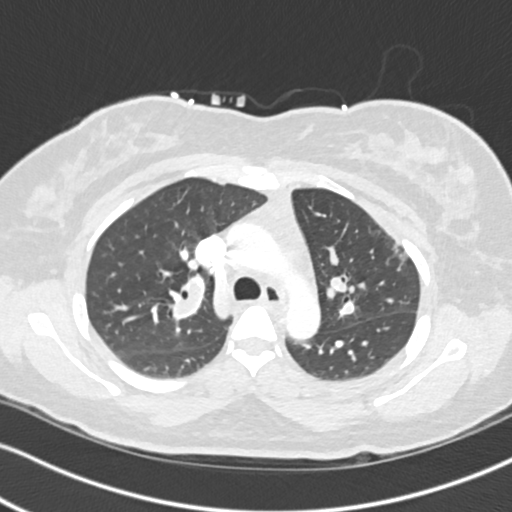
[im 427/561  mediastinal]
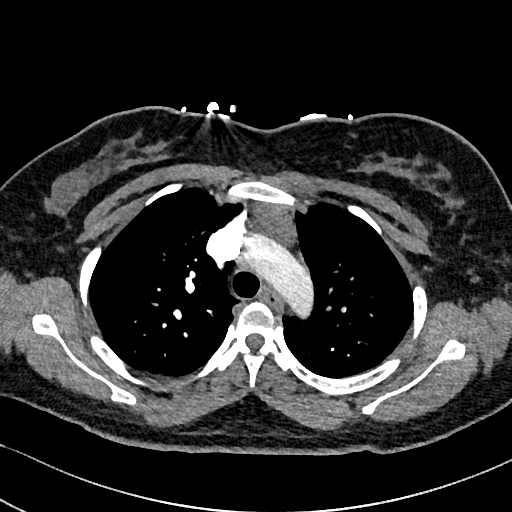
[im 481/561  lung]
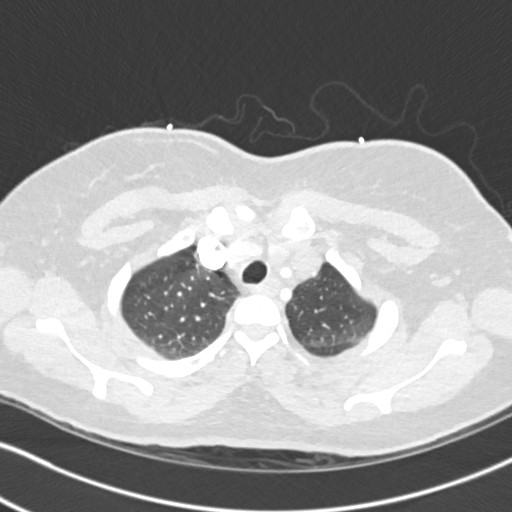
[im 507/561  mediastinal]
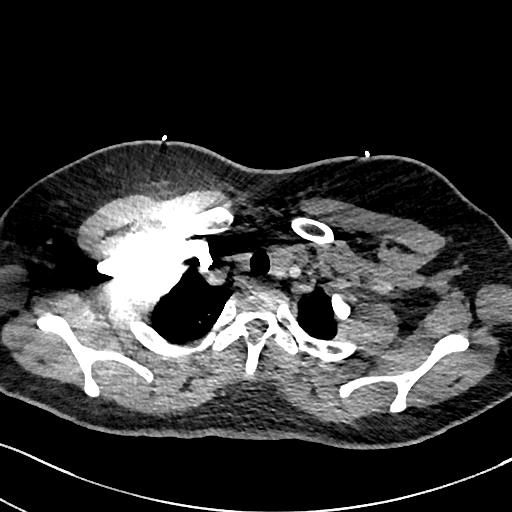
[im 534/561  lung]
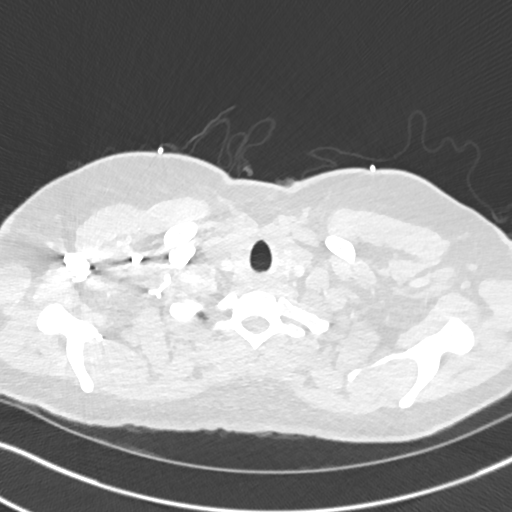

[Series 7: pe 2mm cor · coronal · 0.56mm/px · 1 of 129 slices shown]
[im 65/129  mediastinal]
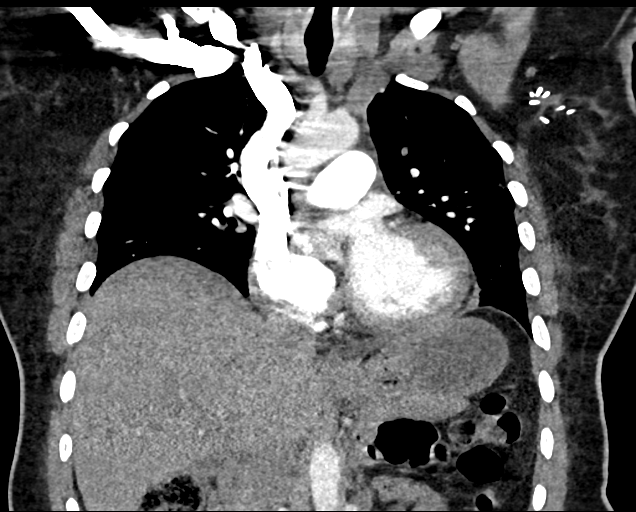

[18 of 36 positions shown; findings below may reference images not displayed]

FINDINGS: Mediastinum/Lymph Nodes: Acute pulmonary embolism is seen in the
distal right and left pulmonary arteries and all lobar branches.
RV/LV ratio equals 1 .38, consistent with right heart strain.

Abnormal soft tissue density is seen within the anterior mediastinum
which has a somewhat triangular configuration. This appearance
favors rebound thymic hyperplasia, however mediastinal
lymphadenopathy cannot definitely be excluded. No axillary
lymphadenopathy identified. Surgical clips seen in left breast and
axilla.

Lungs/Pleura: No pulmonary mass, infiltrate, or effusion. Mild
radiation changes seen within lingula.

Upper abdomen: No acute findings.

Musculoskeletal: No chest wall mass or suspicious bone lesions
identified.

Review of the MIP images confirms the above findings.
IMPRESSION: Positive for acute PE with CT evidence of right heart strain (RV/LV
Ratio = 1.38) consistent with at least submassive (intermediate
risk) PE. The presence of right heart strain has been associated
with an increased risk of morbidity and mortality. Please activate
Code PE by paging [phone].

Abnormal anterior mediastinal soft tissue density. The appearance
favors rebound thymic hyperplasia, however mediastinal
lymphadenopathy cannot definitely be excluded.

Critical Value/emergent results were called by telephone at the time
of interpretation on 01/12/2015 at [DATE] to Dr. TRENDYKID BELLANFANTI ,
who verbally acknowledged these results.

## 2017-07-07 ENCOUNTER — Other Ambulatory Visit: Payer: Self-pay

## 2017-07-07 DIAGNOSIS — C50312 Malignant neoplasm of lower-inner quadrant of left female breast: Secondary | ICD-10-CM

## 2017-07-08 ENCOUNTER — Telehealth: Payer: Self-pay | Admitting: Hematology and Oncology

## 2017-07-08 ENCOUNTER — Inpatient Hospital Stay: Payer: Medicaid Other

## 2017-07-08 ENCOUNTER — Inpatient Hospital Stay: Payer: Medicaid Other | Admitting: Hematology and Oncology

## 2017-07-08 NOTE — Telephone Encounter (Signed)
Patient lvm to cancel appointment 5/15. I tried to call patient to r/s appointments but was unable to reach her.

## 2017-09-10 ENCOUNTER — Emergency Department (HOSPITAL_COMMUNITY)
Admission: EM | Admit: 2017-09-10 | Discharge: 2017-09-11 | Disposition: A | Discharge status: Home / Self Care | Payer: Medicaid Other | Attending: Emergency Medicine | Admitting: Emergency Medicine

## 2017-09-10 ENCOUNTER — Encounter (HOSPITAL_COMMUNITY): Payer: Self-pay | Admitting: Emergency Medicine

## 2017-09-10 ENCOUNTER — Other Ambulatory Visit: Payer: Self-pay

## 2017-09-10 DIAGNOSIS — Z853 Personal history of malignant neoplasm of breast: Secondary | ICD-10-CM | POA: Insufficient documentation

## 2017-09-10 DIAGNOSIS — Z91040 Latex allergy status: Secondary | ICD-10-CM | POA: Insufficient documentation

## 2017-09-10 DIAGNOSIS — Z79899 Other long term (current) drug therapy: Secondary | ICD-10-CM | POA: Insufficient documentation

## 2017-09-10 DIAGNOSIS — G43909 Migraine, unspecified, not intractable, without status migrainosus: Secondary | ICD-10-CM | POA: Diagnosis not present

## 2017-09-10 DIAGNOSIS — R112 Nausea with vomiting, unspecified: Secondary | ICD-10-CM | POA: Insufficient documentation

## 2017-09-10 DIAGNOSIS — R51 Headache: Secondary | ICD-10-CM | POA: Diagnosis present

## 2017-09-10 DIAGNOSIS — F439 Reaction to severe stress, unspecified: Secondary | ICD-10-CM

## 2017-09-10 LAB — COMPREHENSIVE METABOLIC PANEL
ALK PHOS: 54 U/L (ref 38–126)
ALT: 12 U/L (ref 0–44)
AST: 16 U/L (ref 15–41)
Albumin: 3.7 g/dL (ref 3.5–5.0)
Anion gap: 8 (ref 5–15)
BILIRUBIN TOTAL: 0.5 mg/dL (ref 0.3–1.2)
BUN: 10 mg/dL (ref 6–20)
CALCIUM: 9.3 mg/dL (ref 8.9–10.3)
CO2: 28 mmol/L (ref 22–32)
CREATININE: 0.89 mg/dL (ref 0.44–1.00)
Chloride: 102 mmol/L (ref 98–111)
GFR calc non Af Amer: >60 mL/min (ref >60)
GLUCOSE: 108 mg/dL — AB (ref 70–99)
Potassium: 3.5 mmol/L (ref 3.5–5.1)
SODIUM: 138 mmol/L (ref 135–145)
Total Protein: 7.4 g/dL (ref 6.5–8.1)

## 2017-09-10 LAB — CBC
HCT: 37.4 % (ref 36.0–46.0)
Hemoglobin: 12.1 g/dL (ref 12.0–15.0)
MCH: 29.3 pg (ref 26.0–34.0)
MCHC: 32.4 g/dL (ref 30.0–36.0)
MCV: 90.6 fL (ref 78.0–100.0)
PLATELETS: 333 10*3/uL (ref 150–400)
RBC: 4.13 MIL/uL (ref 3.87–5.11)
RDW: 13.2 % (ref 11.5–15.5)
WBC: 8.1 10*3/uL (ref 4.0–10.5)

## 2017-09-10 LAB — URINALYSIS, ROUTINE W REFLEX MICROSCOPIC
Bacteria, UA: NONE SEEN
Bilirubin Urine: NEGATIVE
Glucose, UA: NEGATIVE mg/dL
Hgb urine dipstick: NEGATIVE
Ketones, ur: NEGATIVE mg/dL
Leukocytes, UA: NEGATIVE
Nitrite: NEGATIVE
PH: 5 (ref 5.0–8.0)
Protein, ur: 30 mg/dL — AB
SPECIFIC GRAVITY, URINE: 1.03 (ref 1.005–1.030)

## 2017-09-10 LAB — I-STAT BETA HCG BLOOD, ED (MC, WL, AP ONLY): I-stat hCG, quantitative: <5 m[IU]/mL (ref <5)

## 2017-09-10 LAB — LIPASE, BLOOD: Lipase: 38 U/L (ref 11–51)

## 2017-09-10 NOTE — ED Triage Notes (Signed)
Pt c/o headaches "every other day" x 2 weeks, hx migraines, nausea and vomiting. Reports stress at work. A&O x 4.

## 2017-09-11 MED ORDER — PROMETHAZINE HCL 25 MG PO TABS: 25.0000 mg | ORAL_TABLET | Freq: Four times a day (QID) | ORAL | 0 refills | Status: DC | PRN

## 2017-09-11 MED ORDER — SODIUM CHLORIDE 0.9 % IV BOLUS
1000.0000 mL | Freq: Once | INTRAVENOUS | Status: AC
  Administered 2017-09-11: 1000 mL via INTRAVENOUS

## 2017-09-11 MED ORDER — PROCHLORPERAZINE EDISYLATE 10 MG/2ML IJ SOLN
10.0000 mg | Freq: Once | INTRAMUSCULAR | Status: AC
  Administered 2017-09-11: 10 mg via INTRAVENOUS
  Filled 2017-09-11: qty 2

## 2017-09-11 MED ORDER — TRAMADOL HCL 50 MG PO TABS: 50.0000 mg | ORAL_TABLET | Freq: Four times a day (QID) | ORAL | 0 refills | Status: DC | PRN

## 2017-09-11 MED ORDER — KETOROLAC TROMETHAMINE 30 MG/ML IJ SOLN
30.0000 mg | Freq: Once | INTRAMUSCULAR | Status: AC
  Administered 2017-09-11: 30 mg via INTRAVENOUS
  Filled 2017-09-11: qty 1

## 2017-09-11 NOTE — ED Notes (Signed)
Pt has restricted extremity on the left. RN unsuccessful with IV attempt on the right. IV team consult ordered
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
## 2017-10-05 ENCOUNTER — Encounter: Payer: Self-pay | Admitting: Neurology

## 2017-11-19 ENCOUNTER — Encounter: Payer: Self-pay | Admitting: Hematology and Oncology
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
## 2017-11-30 ENCOUNTER — Ambulatory Visit (INDEPENDENT_AMBULATORY_CARE_PROVIDER_SITE_OTHER): Payer: Medicaid Other | Admitting: Neurology

## 2017-11-30 ENCOUNTER — Encounter: Payer: Self-pay | Admitting: Neurology

## 2017-11-30 VITALS — BP 114/80 | HR 82 | Ht 64.0 in | Wt 224.0 lb

## 2017-11-30 DIAGNOSIS — F32A Depression, unspecified: Secondary | ICD-10-CM

## 2017-11-30 DIAGNOSIS — F329 Major depressive disorder, single episode, unspecified: Secondary | ICD-10-CM

## 2017-11-30 DIAGNOSIS — G43709 Chronic migraine without aura, not intractable, without status migrainosus: Secondary | ICD-10-CM

## 2017-11-30 MED ORDER — SERTRALINE HCL 25 MG PO TABS: ORAL_TABLET | ORAL | 0 refills | Status: DC

## 2017-11-30 NOTE — Patient Instructions (Signed)
Migraine Recommendations: 1.  Start sertraline 25mg  for both depression and headache prevention.  Take 1 tablet daily for 7 days, then increase to 2 tablets daily.  Contact me in 5 weeks with update and we can adjust dose if needed. 2.  Continue Aleve when you get a headache.  Take promethazine 25mg  for nausea. 3.  Limit use of pain relievers to no more than 2 days out of the week.  These medications include acetaminophen, ibuprofen, triptans and narcotics.  This will help reduce risk of rebound headaches. 4.  Be aware of common food triggers such as processed sweets, processed foods with nitrites (such as deli meat, hot dogs, sausages), foods with MSG, alcohol (such as wine), chocolate, certain cheeses, certain fruits (dried fruits, bananas, some citrus fruit), vinegar, diet soda. 4.  Avoid caffeine 5.  Routine exercise 6.  Proper sleep hygiene 7.  Stay adequately hydrated with water 8.  Keep a headache diary. 9.  Maintain proper stress management. 10.  Do not skip meals. 11.  Consider supplements:  Magnesium citrate 400mg  to 600mg  daily, riboflavin 400mg , Coenzyme Q 10 100mg  three times daily 12.  We will refer you for counseling to address the depression. 12.  Follow up in 3 to 4 months.

## 2017-12-02 ENCOUNTER — Ambulatory Visit: Payer: Medicaid Other | Admitting: Diagnostic Neuroimaging

## 2017-12-03 ENCOUNTER — Encounter: Payer: Self-pay | Admitting: Diagnostic Neuroimaging

## 2017-12-03 ENCOUNTER — Encounter (INDEPENDENT_AMBULATORY_CARE_PROVIDER_SITE_OTHER): Payer: Self-pay | Admitting: Physician Assistant

## 2017-12-09 ENCOUNTER — Ambulatory Visit: Payer: Medicaid Other | Admitting: Diagnostic Neuroimaging

## 2018-03-04 NOTE — Progress Notes (Signed)
Received [facility] report. Sent to scan 
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
## 2018-04-06 ENCOUNTER — Ambulatory Visit: Payer: Medicaid Other | Admitting: Neurology

## 2018-06-24 ENCOUNTER — Telehealth: Payer: Self-pay | Admitting: *Deleted

## 2018-06-24 ENCOUNTER — Telehealth: Payer: Self-pay | Admitting: Hematology and Oncology

## 2018-06-24 NOTE — Telephone Encounter (Signed)
Scheduled appt per 4/29 sch message - unable to reach patient and unable to leave message. Let RN know .

## 2018-06-24 NOTE — Telephone Encounter (Signed)
Called and spoke with pt to let her be aware that an apt has been made with Dr. Lindi Adie on Wednesday May 6th at 3:00 pm.  Pt verbalized understanding.

## 2018-06-29 ENCOUNTER — Telehealth: Payer: Self-pay | Admitting: *Deleted

## 2018-06-29 NOTE — Telephone Encounter (Signed)
Received call from pt stating she has transportation issues and will not be able to make it to her apt on May 6th.  Pt states she would like to reschedule for Tuesday May 12th at 2pm.  Apt re scheduled and pt verbalized understanding of date and time.

## 2018-06-30 ENCOUNTER — Inpatient Hospital Stay: Payer: Medicaid Other | Admitting: Hematology and Oncology

## 2018-07-02 NOTE — Assessment & Plan Note (Signed)
Left lumpectomy 04/19/2014: IDC grade 2, 1.5 cm, intermediate grade DCIS, 0/2 lymph nodes, ER 90%, PR 90%, HER-2 negative ratio 1.33, Ki-67 20%, Oncotype DX recurrence score 23, 15% ROR Right lumpectomy: Fibrocystic changes no malignancy. 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  Current treatment: Surveillance. 1.  Mammogram 09/08/2017: Cluster of 5 to 15 mm masses in the right breast likely representing cysts, ultrasound was performed which did not show any evidence of malignancy.  Felt to be benign. 2.  We have ordered MRIs in the past but she has not done those.  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  Unless she receives ovarian suppression we cannot use aromatase inhibitor therapy.

## 2018-07-02 NOTE — Assessment & Plan Note (Signed)
Acute pulmonary embolism diagnosed 01/12/2015 when she presented with substernal chest pain and shortness of breath and dizziness walking (distal right and left pulmonary arteries and all lobar branches) xarelto completed May 2017  CT chest 01/12/2015: positive for pulmonary embolism with CT evidence of right heart strain , abnormal anterior mediastinal soft tissue density fevers thymic hyperplasia however mediastinal lymphadenopathy cannot be excluded definitively  Hypercoagulability panel did not reveal any abnormalities involving protein C, protein S, antithrombin III, 2 glycoprotein antibodies, anti-cardiolipin antibodies, factor V Leiden, factor VIII
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
## 2018-07-06 ENCOUNTER — Inpatient Hospital Stay: Payer: Medicaid Other | Attending: Hematology and Oncology | Admitting: Hematology and Oncology

## 2018-07-06 ENCOUNTER — Other Ambulatory Visit: Payer: Self-pay | Admitting: *Deleted

## 2018-07-06 ENCOUNTER — Other Ambulatory Visit: Payer: Self-pay

## 2018-07-06 VITALS — BP 139/80 | HR 73 | Temp 98.3°F | Resp 18 | Ht 64.0 in | Wt 236.9 lb

## 2018-07-06 DIAGNOSIS — Z1502 Genetic susceptibility to malignant neoplasm of ovary: Secondary | ICD-10-CM

## 2018-07-06 DIAGNOSIS — M129 Arthropathy, unspecified: Secondary | ICD-10-CM | POA: Insufficient documentation

## 2018-07-06 DIAGNOSIS — Z853 Personal history of malignant neoplasm of breast: Secondary | ICD-10-CM | POA: Insufficient documentation

## 2018-07-06 DIAGNOSIS — Z1231 Encounter for screening mammogram for malignant neoplasm of breast: Secondary | ICD-10-CM

## 2018-07-06 DIAGNOSIS — Z86711 Personal history of pulmonary embolism: Secondary | ICD-10-CM | POA: Insufficient documentation

## 2018-07-06 DIAGNOSIS — C50312 Malignant neoplasm of lower-inner quadrant of left female breast: Secondary | ICD-10-CM | POA: Diagnosis not present

## 2018-07-06 DIAGNOSIS — Z7901 Long term (current) use of anticoagulants: Secondary | ICD-10-CM | POA: Diagnosis not present

## 2018-07-06 DIAGNOSIS — Z809 Family history of malignant neoplasm, unspecified: Secondary | ICD-10-CM | POA: Insufficient documentation

## 2018-07-06 DIAGNOSIS — Z1509 Genetic susceptibility to other malignant neoplasm: Secondary | ICD-10-CM

## 2018-07-06 DIAGNOSIS — K219 Gastro-esophageal reflux disease without esophagitis: Secondary | ICD-10-CM | POA: Diagnosis not present

## 2018-07-06 DIAGNOSIS — Z923 Personal history of irradiation: Secondary | ICD-10-CM | POA: Diagnosis not present

## 2018-07-06 DIAGNOSIS — I2699 Other pulmonary embolism without acute cor pulmonale: Secondary | ICD-10-CM | POA: Diagnosis not present

## 2018-07-06 DIAGNOSIS — Z87891 Personal history of nicotine dependence: Secondary | ICD-10-CM | POA: Insufficient documentation

## 2018-07-06 DIAGNOSIS — Z79899 Other long term (current) drug therapy: Secondary | ICD-10-CM | POA: Diagnosis not present

## 2018-07-06 DIAGNOSIS — Z170 Estrogen receptor positive status [ER+]: Secondary | ICD-10-CM

## 2018-07-06 DIAGNOSIS — C50919 Malignant neoplasm of unspecified site of unspecified female breast: Secondary | ICD-10-CM | POA: Diagnosis not present

## 2018-07-06 DIAGNOSIS — Z1589 Genetic susceptibility to other disease: Secondary | ICD-10-CM

## 2018-07-06 DIAGNOSIS — Z803 Family history of malignant neoplasm of breast: Secondary | ICD-10-CM | POA: Diagnosis not present

## 2018-07-07 ENCOUNTER — Other Ambulatory Visit: Payer: Self-pay | Admitting: *Deleted

## 2018-07-07 DIAGNOSIS — Z170 Estrogen receptor positive status [ER+]: Secondary | ICD-10-CM

## 2018-07-07 DIAGNOSIS — C50312 Malignant neoplasm of lower-inner quadrant of left female breast: Secondary | ICD-10-CM

## 2018-07-08 ENCOUNTER — Ambulatory Visit: Payer: Medicaid Other | Admitting: Physical Therapy

## 2018-07-08 ENCOUNTER — Other Ambulatory Visit: Payer: Self-pay | Admitting: Hematology and Oncology

## 2018-07-08 DIAGNOSIS — C50312 Malignant neoplasm of lower-inner quadrant of left female breast: Secondary | ICD-10-CM

## 2018-07-13 ENCOUNTER — Encounter (HOSPITAL_COMMUNITY): Payer: Self-pay

## 2018-07-13 ENCOUNTER — Ambulatory Visit (HOSPITAL_COMMUNITY)
Admission: RE | Admit: 2018-07-13 | Discharge: 2018-07-13 | Disposition: A | Discharge status: Home / Self Care | Payer: Medicaid Other | Source: Ambulatory Visit | Attending: Hematology and Oncology | Admitting: Hematology and Oncology

## 2018-07-13 ENCOUNTER — Inpatient Hospital Stay: Payer: Medicaid Other

## 2018-07-13 ENCOUNTER — Other Ambulatory Visit: Payer: Self-pay

## 2018-07-13 DIAGNOSIS — Z1502 Genetic susceptibility to malignant neoplasm of ovary: Secondary | ICD-10-CM | POA: Diagnosis present

## 2018-07-13 DIAGNOSIS — C50919 Malignant neoplasm of unspecified site of unspecified female breast: Secondary | ICD-10-CM | POA: Diagnosis present

## 2018-07-13 DIAGNOSIS — Z1509 Genetic susceptibility to other malignant neoplasm: Secondary | ICD-10-CM | POA: Diagnosis present

## 2018-07-13 DIAGNOSIS — C50312 Malignant neoplasm of lower-inner quadrant of left female breast: Secondary | ICD-10-CM | POA: Diagnosis not present

## 2018-07-13 DIAGNOSIS — Z170 Estrogen receptor positive status [ER+]: Secondary | ICD-10-CM

## 2018-07-13 DIAGNOSIS — Z1589 Genetic susceptibility to other disease: Secondary | ICD-10-CM | POA: Diagnosis present

## 2018-07-13 DIAGNOSIS — Z853 Personal history of malignant neoplasm of breast: Secondary | ICD-10-CM | POA: Diagnosis not present

## 2018-07-13 LAB — CBC WITH DIFFERENTIAL (CANCER CENTER ONLY)
Abs Immature Granulocytes: 0.03 10*3/uL (ref 0.00–0.07)
Basophils Absolute: 0 10*3/uL (ref 0.0–0.1)
Basophils Relative: 1 %
Eosinophils Absolute: 0.1 10*3/uL (ref 0.0–0.5)
Eosinophils Relative: 2 %
HCT: 35.3 % — ABNORMAL LOW (ref 36.0–46.0)
Hemoglobin: 11.5 g/dL — ABNORMAL LOW (ref 12.0–15.0)
Immature Granulocytes: 0 %
Lymphocytes Relative: 32 %
Lymphs Abs: 2.4 10*3/uL (ref 0.7–4.0)
MCH: 29.3 pg (ref 26.0–34.0)
MCHC: 32.6 g/dL (ref 30.0–36.0)
MCV: 89.8 fL (ref 80.0–100.0)
Monocytes Absolute: 0.5 10*3/uL (ref 0.1–1.0)
Monocytes Relative: 7 %
Neutro Abs: 4.3 10*3/uL (ref 1.7–7.7)
Neutrophils Relative %: 58 %
Platelet Count: 311 10*3/uL (ref 150–400)
RBC: 3.93 MIL/uL (ref 3.87–5.11)
RDW: 13.2 % (ref 11.5–15.5)
WBC Count: 7.4 10*3/uL (ref 4.0–10.5)
nRBC: 0 % (ref 0.0–0.2)

## 2018-07-13 LAB — CMP (CANCER CENTER ONLY)
ALT: 13 U/L (ref 0–44)
AST: 14 U/L — ABNORMAL LOW (ref 15–41)
Albumin: 3.7 g/dL (ref 3.5–5.0)
Alkaline Phosphatase: 55 U/L (ref 38–126)
Anion gap: 8 (ref 5–15)
BUN: 16 mg/dL (ref 6–20)
CO2: 26 mmol/L (ref 22–32)
Calcium: 8.7 mg/dL — ABNORMAL LOW (ref 8.9–10.3)
Chloride: 103 mmol/L (ref 98–111)
Creatinine: 0.85 mg/dL (ref 0.44–1.00)
GFR, Est AFR Am: >60 mL/min (ref >60)
GFR, Estimated: >60 mL/min (ref >60)
Glucose, Bld: 99 mg/dL (ref 70–99)
Potassium: 3.7 mmol/L (ref 3.5–5.1)
Sodium: 137 mmol/L (ref 135–145)
Total Bilirubin: 0.4 mg/dL (ref 0.3–1.2)
Total Protein: 7.7 g/dL (ref 6.5–8.1)

## 2018-07-13 MED ORDER — IOHEXOL 300 MG/ML  SOLN
30.0000 mL | Freq: Once | INTRAMUSCULAR | Status: AC | PRN
  Administered 2018-07-13: 10:00:00 30 mL via ORAL

## 2018-07-13 MED ORDER — SODIUM CHLORIDE (PF) 0.9 % IJ SOLN
INTRAMUSCULAR | Status: AC
  Filled 2018-07-13: qty 50

## 2018-07-13 MED ORDER — IOHEXOL 300 MG/ML  SOLN
100.0000 mL | Freq: Once | INTRAMUSCULAR | Status: AC | PRN
  Administered 2018-07-13: 13:00:00 100 mL via INTRAVENOUS

## 2018-07-15 ENCOUNTER — Ambulatory Visit: Payer: Medicaid Other | Attending: Hematology and Oncology | Admitting: Physical Therapy

## 2018-07-15 ENCOUNTER — Encounter: Payer: Self-pay | Admitting: Physical Therapy

## 2018-07-15 ENCOUNTER — Other Ambulatory Visit: Payer: Self-pay

## 2018-07-15 DIAGNOSIS — M545 Low back pain: Secondary | ICD-10-CM | POA: Insufficient documentation

## 2018-07-15 DIAGNOSIS — M6281 Muscle weakness (generalized): Secondary | ICD-10-CM

## 2018-07-15 DIAGNOSIS — G8929 Other chronic pain: Secondary | ICD-10-CM | POA: Diagnosis present

## 2018-07-15 DIAGNOSIS — R293 Abnormal posture: Secondary | ICD-10-CM | POA: Insufficient documentation
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
## 2018-07-20 ENCOUNTER — Encounter (HOSPITAL_COMMUNITY): Payer: Medicaid Other

## 2018-07-21 ENCOUNTER — Telehealth: Payer: Self-pay | Admitting: Hematology and Oncology

## 2018-07-21 NOTE — Telephone Encounter (Signed)
I was trying to reach pt to verify webex appt for pre reg but I could not reach patient on the phone or leave a msg because the vm was full.

## 2018-07-21 NOTE — Progress Notes (Signed)
HEMATOLOGY-ONCOLOGY DOXIMITY VISIT PROGRESS NOTE  I connected with Janice Crawford on 07/22/2018 at 11:15 AM EDT by Doximity video conference and verified that I am speaking with the correct person using two identifiers.  I discussed the limitations, risks, security and privacy concerns of performing an evaluation and management service by Doximity and the availability of in person appointments.  I also discussed with the patient that there may be a patient responsible charge related to this service. The patient expressed understanding and agreed to proceed.  Patient's Location: Home Physician Location: Clinic  CHIEF COMPLIANT: Follow-up to review recent scans  INTERVAL HISTORY: Janice Crawford is a 43 y.o. female with above-mentioned history of left breast cancer and acute pulmonary embolism. CT CAP on 07/13/18 showed no evidence of recurrent or metastatic disease and indeterminate irregular soft tissue in the 6 o'clock position of the right breast. She presents over Doximity today to review the results of her scans.     Cancer of lower-inner quadrant of left female breast ([place])   12/06/2013 Breast MRI    Left breast middle depth 11 mm irregular mass no abnormal lymph nodes seen, Right breast lower inner quadrant 1.3 cm area; multiple cystic lesions throughout the breast on both sides    12/16/2013 Initial Biopsy    LEFT breast needle core bx: Invasive mammary carcinoma with mammary carcinoma in situ; lobular features, grade 2, ER+ (90%), PR+ (90%), Ki-67 20%, HER-2 negative (ratio 1.33)    12/16/2013 Initial Biopsy    RIGHT breast needle core bx: intraductal papilloma.    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     04/19/2014 Definitive Surgery    Left lumpectomy [facility]): IDC grade 2, 1.5 cm, intermediate grade DCIS,  0/2 lymph nodes, ER 90%, PR 90%, HER-2 negative ratio 1.33, Ki-67 20%,      04/19/2014 Oncotype testing    Recurrence score 23 (15% ROR). No chemotherapy Lindi Adie).    04/19/2014 Pathologic Stage    LEFT lumpectomy with [facility] Dienes): Invasive ductal carcinoma, grade 2/3, 1.5 cm, DCIS intermediate grade.  2 LN negative for malignancy (0/2). RIGHT lumpectomy: fibrocystic changes    06/08/2014 - 07/28/2014 Radiation Therapy    Adjuvant RT completed Valere Dross).  Left breast45 Gy over 25 fractions. Left breast boost 18 cGy over 9 fractions.  Total dose: 60 Gy.     Anti-estrogen oral therapy    Tamoxifen 20 mg daily (Khalfani Weideman).  Planned duration of therapy 10 years.    11/17/2014 Survivorship    Survivorship visit completed and copy of survivorship care plan provided to patient.    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[place])   02/01/2014 Initial Diagnosis    PALB2-related breast cancer     REVIEW OF SYSTEMS:   Constitutional: Denies fevers, chills or abnormal weight loss Eyes: Denies blurriness of vision Ears, nose, mouth, throat, and face: Denies mucositis or sore throat Respiratory: Denies cough, dyspnea or wheezes Cardiovascular: Denies palpitation, chest discomfort Gastrointestinal:  Denies nausea, heartburn or change in bowel habits Skin: Denies abnormal skin rashes Lymphatics: Denies new lymphadenopathy or easy bruising Neurological:Denies numbness, tingling or new weaknesses Behavioral/Psych: Mood is stable, no new changes  Extremities: No lower extremity edema Breast: denies any pain or lumps or nodules  in either breasts All other systems were reviewed with the patient and are negative.  Observations/Objective:  There were no vitals filed for this visit. There is no height or weight on file to calculate BMI.  I have reviewed the data as listed CMP Latest Ref Rng & Units 07/13/2018 09/10/2017 08/26/2015   Glucose 70 - 99 mg/dL 99 108(H) 97  BUN 6 - 20 mg/dL '16 10 8  '$ Creatinine 0.44 - 1.00 mg/dL 0.85 0.89 0.79  Sodium 135 - 145 mmol/L 137 138 135  Potassium 3.5 - 5.1 mmol/L 3.7 3.5 3.3(L)  Chloride 98 - 111 mmol/L 103 102 103  CO2 22 - 32 mmol/L '26 28 24  '$ Calcium 8.9 - 10.3 mg/dL 8.7(L) 9.3 8.7(L)  Total Protein 6.5 - 8.1 g/dL 7.7 7.4 6.6  Total Bilirubin 0.3 - 1.2 mg/dL 0.4 0.5 0.7  Alkaline Phos 38 - 126 U/L 55 54 43  AST 15 - 41 U/L 14(L) 16 15  ALT 0 - 44 U/L 13 12 11(L)    Lab Results  Component Value Date   WBC 7.4 07/13/2018   HGB 11.5 (L) 07/13/2018   HCT 35.3 (L) 07/13/2018   MCV 89.8 07/13/2018   PLT 311 07/13/2018   NEUTROABS 4.3 07/13/2018      Assessment Plan:  Cancer of lower-inner quadrant of left female breast ([place]) Left lumpectomy 04/19/2014: IDC grade 2, 1.5 cm, intermediate grade DCIS, 0/2 lymph nodes, ER 90%, PR 90%, HER-2 negative ratio 1.33, Ki-67 20%, Oncotype DX recurrence score 23, 15% ROR Right lumpectomy: Fibrocystic changes no malignancy. 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  I ordered her breast MRI to be done in the next month  Current treatment: Surveillance. 1.  Mammogram 09/08/2017: Cluster of 5 to 15 mm masses in the right breast likely representing cysts, ultrasound was performed which did not show any evidence of malignancy.  Felt to be benign. 2.  Breast MRI: Needs to be scheduled  3.  CT chest abdomen pelvis: No evidence of metastatic disease, right breast cystic lesion needs to be further evaluated.  She will have a breast MRI and it can evaluate bilateral breasts.  I had great difficulty getting in touch with the patient because her phone was not working.  I was able to get a video chat getting help from her friend who  was able to face time her and inform her that we are going to have a video visit.    I discussed the assessment and treatment plan with the patient. The patient was provided an opportunity to ask questions and all were answered. The patient agreed with the plan and demonstrated an understanding of the instructions. The patient was advised to call back or seek an in-person evaluation if the symptoms worsen or if the condition fails to improve as anticipated.   I provided 15 minutes of face-to-face Doximity time during this encounter.    Rulon Eisenmenger, MD 07/22/2018   I, Molly Dorshimer, am acting as scribe for Nicholas Lose, MD.  I have reviewed the above documentation for accuracy and completeness, and I agree with the above.

## 2018-07-22 ENCOUNTER — Inpatient Hospital Stay (HOSPITAL_BASED_OUTPATIENT_CLINIC_OR_DEPARTMENT_OTHER): Payer: Medicaid Other | Admitting: Hematology and Oncology

## 2018-07-22 DIAGNOSIS — C50312 Malignant neoplasm of lower-inner quadrant of left female breast: Secondary | ICD-10-CM

## 2018-07-22 DIAGNOSIS — Z923 Personal history of irradiation: Secondary | ICD-10-CM

## 2018-07-22 DIAGNOSIS — Z170 Estrogen receptor positive status [ER+]: Secondary | ICD-10-CM

## 2018-07-22 DIAGNOSIS — Z79810 Long term (current) use of selective estrogen receptor modulators (SERMs): Secondary | ICD-10-CM | POA: Diagnosis not present

## 2018-07-22 NOTE — Assessment & Plan Note (Signed)
Left lumpectomy 04/19/2014: IDC grade 2, 1.5 cm, intermediate grade DCIS, 0/2 lymph nodes, ER 90%, PR 90%, HER-2 negative ratio 1.33, Ki-67 20%, Oncotype DX recurrence score 23, 15% ROR Right lumpectomy: Fibrocystic changes no malignancy. 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  I ordered her breast MRI to be done in the next month  Current treatment: Surveillance. 1.  Mammogram 09/08/2017: Cluster of 5 to 15 mm masses in the right breast likely representing cysts, ultrasound was performed which did not show any evidence of malignancy.  Felt to be benign. 2.  Breast MRI: Needs to be scheduled  3.  CT chest abdomen pelvis: No evidence of metastatic disease, right breast cystic lesion needs to be further evaluated.  She will have a breast MRI and it can evaluate bilateral breasts.  I had great difficulty getting in touch with the patient because her phone was not working.  I was able to get a video chat getting help from her friend who was able to face time her and inform her that we are going to have a video visit.

## 2018-08-04 ENCOUNTER — Encounter (HOSPITAL_COMMUNITY): Admission: RE | Admit: 2018-08-04 | Payer: Medicaid Other | Source: Ambulatory Visit

## 2018-08-04 ENCOUNTER — Encounter (HOSPITAL_COMMUNITY): Payer: Medicaid Other

## 2018-08-05 ENCOUNTER — Ambulatory Visit: Payer: Medicaid Other | Admitting: Physical Therapy

## 2018-08-10 ENCOUNTER — Ambulatory Visit: Payer: Medicaid Other | Admitting: Physical Therapy

## 2018-08-13 ENCOUNTER — Ambulatory Visit
Admission: RE | Admit: 2018-08-13 | Discharge: 2018-08-13 | Disposition: A | Discharge status: Home / Self Care | Payer: Medicaid Other | Source: Ambulatory Visit | Attending: Hematology and Oncology | Admitting: Hematology and Oncology

## 2018-08-13 ENCOUNTER — Other Ambulatory Visit: Payer: Self-pay

## 2018-08-13 DIAGNOSIS — Z1231 Encounter for screening mammogram for malignant neoplasm of breast: Secondary | ICD-10-CM

## 2018-08-13 MED ORDER — GADOBUTROL 1 MMOL/ML IV SOLN
10.0000 mL | Freq: Once | INTRAVENOUS | Status: AC | PRN
  Administered 2018-08-13: 10 mL via INTRAVENOUS

## 2018-08-24 ENCOUNTER — Ambulatory Visit: Payer: Medicaid Other | Attending: Hematology and Oncology | Admitting: Physical Therapy

## 2018-08-24 ENCOUNTER — Other Ambulatory Visit: Payer: Self-pay

## 2018-08-24 ENCOUNTER — Encounter: Payer: Self-pay | Admitting: Physical Therapy

## 2018-08-24 DIAGNOSIS — R293 Abnormal posture: Secondary | ICD-10-CM | POA: Diagnosis present

## 2018-08-24 DIAGNOSIS — M6281 Muscle weakness (generalized): Secondary | ICD-10-CM

## 2018-08-24 DIAGNOSIS — M545 Low back pain: Secondary | ICD-10-CM | POA: Diagnosis present

## 2018-08-24 DIAGNOSIS — G8929 Other chronic pain: Secondary | ICD-10-CM | POA: Diagnosis present
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
## 2018-08-24 NOTE — Patient Instructions (Signed)
Access Code: [email or web address]  URL: [email or web address]  Date: 08/05/2018  Prepared by: Maudry Diego   Program Notes  Do these exercises 2-3 times a week making sure you have a day of rest in between sessions. Before you start a session, warm up with a brisk walk and stretches for your arms, legs and back. Start with 2 sets of 10 of each exercise If you have no pain or swelling, next time do 3 sets of 10. If still no pain the next session, its ok to add some small weight, but back to down to 2 sets of 10 . the next time do 3 sets of 10 with that weight and continue to progress with that pattern. If you increase weight, decrease reps, or if you increase reps, keep weight the same. If you do have symptoms, back off on your weight or reps. If for some reason, you do skip some scheduled exercise sessions, start over with minimal weight and build up slowly again.  Your overall exercise goal is about 150 minutes of moderate intensity (you can talk but not sing) exercise a week that includes 2 sessions of strength training. GO FOR IT! :)   Exercises Wall Push Up - 10 reps - 2-3 sets - hold - 2x weekly Squat with Chair Touch - 10 reps - 2-3 sets - 2x weekly Standing Bent Over Single Arm Shoulder Row - 10 reps - 2-3 sets - 2x weekly Standing Hip Abduction - 10 reps - 2-3 sets - 2x weekly Scaption with Dumbbells - 10 reps - 2-3 sets - 2x weekly Step Up - 10 reps - 2-3 sets - 2x weekly Standing Alternating Bicep Curls with Dumbbells and Rotation - 10 reps - 2-3 sets - 2x weekly Standing Heel Raise - 10 reps - 2-3 sets - 2x weekly Patient Education walking program

## 2018-08-25 NOTE — Addendum Note (Signed)
Addended by: Kipp Laurence on: 08/25/2018 11:55 AM   Modules accepted: Orders

## 2018-12-27 ENCOUNTER — Telehealth: Payer: Self-pay

## 2018-12-27 DIAGNOSIS — Z1509 Genetic susceptibility to other malignant neoplasm: Secondary | ICD-10-CM

## 2018-12-27 DIAGNOSIS — C50919 Malignant neoplasm of unspecified site of unspecified female breast: Secondary | ICD-10-CM

## 2018-12-27 NOTE — Telephone Encounter (Signed)
RN spoke with patient, patient reports feeling new lump on right breast.  First noticed X 3 days ago.  Pt denies any pain to area, discoloration, or trauma to area.    Pt with h/o cyst in right breast, MRI showed no evidence for malignancy. RN reviewed with MD, recommendations to obtain diagnostic mammogram of right breast.  Pt notified, voiced appreciation and agreement.  Order faxed to [facility] for right breast diagnostic mammogram.

## 2019-03-17 ENCOUNTER — Encounter: Payer: Self-pay | Admitting: Hematology and Oncology

## 2019-10-06 ENCOUNTER — Ambulatory Visit: Payer: Medicaid Other | Admitting: Sports Medicine

## 2019-10-20 ENCOUNTER — Ambulatory Visit: Payer: Medicaid Other | Admitting: Sports Medicine

## 2019-10-27 ENCOUNTER — Ambulatory Visit: Payer: Medicaid Other | Admitting: Podiatry

## 2020-04-03 ENCOUNTER — Other Ambulatory Visit: Payer: Self-pay | Admitting: Radiology

## 2020-04-11 ENCOUNTER — Encounter: Payer: Self-pay | Admitting: Hematology and Oncology

## 2020-07-06 ENCOUNTER — Ambulatory Visit (HOSPITAL_COMMUNITY): Payer: Self-pay

## 2020-07-25 ENCOUNTER — Other Ambulatory Visit: Payer: Self-pay

## 2020-07-25 ENCOUNTER — Other Ambulatory Visit (HOSPITAL_COMMUNITY)
Admission: RE | Admit: 2020-07-25 | Discharge: 2020-07-25 | Disposition: A | Discharge status: Home / Self Care | Payer: Medicaid Other | Source: Ambulatory Visit | Attending: Women's Health | Admitting: Women's Health

## 2020-07-25 ENCOUNTER — Ambulatory Visit (INDEPENDENT_AMBULATORY_CARE_PROVIDER_SITE_OTHER): Payer: Medicaid Other | Admitting: Women's Health

## 2020-07-25 ENCOUNTER — Encounter: Payer: Self-pay | Admitting: Women's Health

## 2020-07-25 VITALS — BP 130/89 | HR 82 | Ht 64.0 in | Wt 247.0 lb

## 2020-07-25 DIAGNOSIS — N898 Other specified noninflammatory disorders of vagina: Secondary | ICD-10-CM

## 2020-07-25 DIAGNOSIS — N63 Unspecified lump in unspecified breast: Secondary | ICD-10-CM

## 2020-07-25 DIAGNOSIS — Z202 Contact with and (suspected) exposure to infections with a predominantly sexual mode of transmission: Secondary | ICD-10-CM | POA: Diagnosis not present

## 2020-07-25 DIAGNOSIS — Z124 Encounter for screening for malignant neoplasm of cervix: Secondary | ICD-10-CM | POA: Diagnosis not present

## 2020-07-25 DIAGNOSIS — N939 Abnormal uterine and vaginal bleeding, unspecified: Secondary | ICD-10-CM

## 2020-07-25 NOTE — Progress Notes (Signed)
New patient is in the office reporting irregular cycles. Pt states that she had 2 cycles in the month of May 1 week apart. Last cycle was around the beginning of May. Pt reports last pap a little over a year ago at [facility] in [place]. Reports hx of abnormal paps Last mammogram 03-21-20 Pt reports intermittent sharp pelvic pain 8/10, also reports vaginal odor, discharge and irritation.

## 2020-07-25 NOTE — Progress Notes (Signed)
History:  [name] is a 45 y.o. [id number] who presents to clinic today for irregular periods. Patient reports her normal periods come monthly, last 6 days, patient reports using 3 pads or tampons on her heaviest day.  Patient reports her irregular bleeding came on at the end of April, but reports she only had spotting when wiping initially and then had some heavier bleeding compared to the spotting, but patient reports it was still very light and not as heavy as her usual periods. Patient reports her normal menstrual cycle then came as expected on May, but pt does not remember the day. Patient reports no additional episodes of abnormal vaginal bleeding before or after this single event. Patient also reports intermittent pelvic pain and cramping since January, when she started being sexually active with a new partner. Patient reports for the cramping she will take Tylenol that will relieve the pain for several days, but then it will return. Patient reports she is no longer with her new partner and has not been since the end of March 2022. Patient denies any new partners since that time.  The following portions of the patient's history were reviewed and updated as appropriate: allergies, current medications, family history, past medical history, social history, past surgical history and problem list.  Review of Systems:  Review of Systems  Genitourinary:       Vaginal bleeding Pelvic cramping     Objective:  Physical Exam BP 130/89   Pulse 82   Ht 5\' 4"  (1.626 m)   Wt 247 lb (112 kg)   BMI 42.40 kg/m    Physical Exam Vitals and nursing note reviewed. Exam conducted with a chaperone present.  Constitutional:      General: She is not in acute distress.    Appearance: Normal appearance. She is not ill-appearing, toxic-appearing or diaphoretic.  HENT:     Head: Normocephalic and atraumatic.  Pulmonary:     Effort: Pulmonary effort is normal.  Genitourinary:    General: Normal  vulva.     Labia:        Right: No rash, tenderness or lesion.        Left: No rash, tenderness or lesion.      Vagina: Normal. No vaginal discharge or bleeding.     Cervix: No friability, lesion or cervical bleeding.     Comments: Nulliparous os, cytobrush used in addition to Pap brush for Pap. Skin:    General: Skin is warm and dry.  Neurological:     Mental Status: She is alert and oriented to person, place, and time.  Psychiatric:        Mood and Affect: Mood normal.        Behavior: Behavior normal.        Thought Content: Thought content normal.        Judgment: Judgment normal.    Labs and Imaging No results found for this or any previous visit (from the past 24 hour(s)).  No results found.  Health Maintenance Due  Topic Date Due  . COVID-19 Vaccine (1) Never done  . Hepatitis C Screening  Never done  . TETANUS/TDAP  Never done  . PAP SMEAR-Modifier  Never done   Assessment & Plan:  1. Exposure to sexually transmitted disease (STD) - HIV antibody (with reflex) - RPR - Hepatitis C Antibody - Hepatitis B Surface AntiGEN  2. Vaginal discharge - Cervicovaginal ancillary only( [place])  3. 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 Screening for cervical cancer - Cytology - PAP( [place])  5. Vaginal bleeding - Beta hCG quant (ref lab)   Approximately 10 minutes of total time was spent with this patient on counseling, data collection, physical exam.  Clarisa Fling, NP 07/25/2020 4:19 PM

## 2020-07-25 NOTE — Patient Instructions (Signed)
Preventing Sexually Transmitted Infections, Adult Sexually transmitted infections (STIs) are diseases that are spread from person to person (are contagious). They are spread, or transmitted, through bodily fluids exchanged during sex or sexual contact. These bodily fluids include saliva, semen, blood, vaginal mucus, and urine. STIs are very common among people of all ages. Some common STIs include:  Herpes.  Hepatitis B.  Chlamydia.  Gonorrhea.  Syphilis.  HPV (human papillomavirus).  HIV, also called the human immunodeficiency virus. This is the virus that can cause AIDS (acquired immunodeficiency syndrome). Often, people who have these STIs do not have symptoms. Even without symptoms, these infections can be spread from person to person and require treatment. How can these conditions affect me? STIs can be treated, and many STIs can be cured. However, some STIs cannot be cured and will affect you for the rest of your life. Certain STIs may:  Require you to take medicine for the rest of your life.  Affect your ability to have children (your fertility).  Increase your risk for developing another STI or certain serious health conditions. These may include: ? Cervical cancer. ? Head and neck cancer. ? Pelvic inflammatory disease (PID), in women. ? Organ damage or damage to other parts of your body, if the infection spreads.  Cause problems during pregnancy and may be transmitted to the baby during the pregnancy or childbirth. What can increase my risk? You may have an increased risk for developing an STI if:  You have unprotected sex. Sex includes oral, vaginal, or anal sex.  You have more than one sex partner.  You have a sex partner who has multiple sex partners.  You have sex with someone who has an STI.  You have an STI or you had an STI before.  You inject drugs or have a sex partner or partners who inject drugs. What actions can I take to prevent STIs? The only way  to completely prevent STIs is not to have sex of any kind. This is called practicing abstinence. If you are sexually active, you can protect yourself and others by taking these actions to lower your risk of getting an STI: Lifestyle Avoid mixing alcohol, drugs, and sex. Alcohol and drug use can affect your ability to make good decisions and can lead to risky sexual behaviors. Medicines Ask your health care provider about taking pre-exposure prophylaxis (PrEP) to prevent HIV infection. General information  Stay up to date on vaccinations. Certain vaccines can lower your risk of getting certain STIs, such as: ? Hepatitis A and hepatitis B vaccines. You may have been vaccinated as a young child, but you will likely need a booster shot as a teen or young adult. ? HPV (human papillomavirus) vaccine.  Have only one sex partner (be monogamous) or limit the number of sex partners you have.  Use methods that prevent the exchange of body fluids between partners (barrier protection) correctly every time you have sex. Barrier protection can be used during oral, vaginal, or anal sex. Commonly used barrier methods include: ? Female condom. ? Female condom. ? Dental dam.  Use a new condom for every sex act from start to finish.  Get tested for STIs. Have your partners get tested, too.  If you test positive for an STI, follow recommendations from your health care provider about treatment and make sure your sex partners are tested and treated.  Birth control pills, injections, implants, and intrauterine devices (IUDs) do not protect against STIs. To prevent both STIs and  pregnancy, always use a condom with another form of birth control.  Some STIs, such as herpes, are spread through skin-to-skin contact. A condom may not protect you from getting such STIs. Avoid all sexual contact if you or your partners have herpes and there is an active flare with open sores.   Where to find more information Learn more  about STIs from:  Centers for Disease Control and Prevention: ? More information about specific STIs: https://price.info/ ? Places to get sexual health counseling and treatment for free or at a low cost: gettested.StoreMirror.com.cy  U.S. Department of Health and Human Services: [place].tn Summary  Sexually transmitted infections (STIs) can spread through exchanging bodily fluids during sexual contact. Fluids include saliva, semen, blood, vaginal mucus, and urine.  You may have an increased risk for developing an STI if you have unprotected sex. Sex includes oral, vaginal, or anal sex.  If you do have sex, limit your number of sex partners and use barrier protection every time you have sex. This information is not intended to replace advice given to you by your health care provider. Make sure you discuss any questions you have with your health care provider. Document Revised: 03/28/2019 Document Reviewed: 03/28/2019 Elsevier Patient Education  [place] [facility] Practicing safe sex means taking steps before and during sex to reduce your risk of:  Getting an STI (sexually transmitted infection).  Giving your partner an STI.  Unwanted or unplanned pregnancy. How to practice safe sex Ways you can practice safe sex  Limit your sexual partners to only one partner who is having sex with only you.  Avoid using alcohol and drugs before having sex. Alcohol and drugs can affect your judgment.  Before having sex with a new partner: ? Talk to your partner about past partners, past STIs, and drug use. ? Get screened for STIs and discuss the results with your partner. Ask your partner to get screened too.  Check your body regularly for sores, blisters, rashes, or unusual discharge. If you notice any of these problems, visit your health care provider.  Avoid sexual contact if you have symptoms of an infection or you are being treated for an STI.  While having sex, use a  condom. Make sure to: ? Use a condom every time you have vaginal, oral, or anal sex. Both females and males should wear condoms during oral sex. ? Keep condoms in place from the beginning to the end of sexual activity. ? Use a latex condom, if possible. Latex condoms offer the best protection. ? Use only water-based lubricants with a condom. Using petroleum-based lubricants or oils will weaken the condom and increase the chance that it will break.   Ways your health care provider can help you practice safe sex  See your health care provider for regular screenings, exams, and tests for STIs.  Talk with your health care provider about what kind of birth control (contraception) is best for you.  Get vaccinated against hepatitis B and human papillomavirus (HPV).  If you are at risk of being infected with HIV (human immunodeficiency virus), talk with your health care provider about taking a prescription medicine to prevent HIV infection. You are at risk for HIV if you: ? Are a man who has sex with other men. ? Are sexually active with more than one partner. ? Take drugs by injection. ? Have a sex partner who has HIV. ? Have unprotected sex. ? Have sex  with someone who has sex with both men and women. ? Have had an STI.   Follow these instructions at home:  Take over-the-counter and prescription medicines only as told by your health care provider.  Keep all follow-up visits. This is important. Where to find more information  Centers for Disease Control and Prevention: http://www.wolf.info/  Planned Parenthood: www.plannedparenthood.org  Office on [employer]: [place]s.tn Summary  Practicing safe sex means taking steps before and during sex to reduce your risk getting an STI, giving your partner an STI, and having an unwanted or unplanned pregnancy.  Before having sex with a new partner, talk to your partner about past partners, past STIs, and drug use.  Use a condom every time you  have vaginal, oral, or anal sex. Both females and males should wear condoms during oral sex.  Check your body regularly for sores, blisters, rashes, or unusual discharge. If you notice any of these problems, visit your health care provider.  See your health care provider for regular screenings, exams, and tests for STIs. This information is not intended to replace advice given to you by your health care provider. Make sure you discuss any questions you have with your health care provider. Document Revised: 07/18/2019 Document Reviewed: 07/18/2019 Elsevier Patient Education  [place].

## 2020-07-26 LAB — CERVICOVAGINAL ANCILLARY ONLY
Bacterial Vaginitis (gardnerella): NEGATIVE
Candida Glabrata: NEGATIVE
Candida Vaginitis: NEGATIVE
Chlamydia: NEGATIVE
Comment: NEGATIVE
Comment: NEGATIVE
Comment: NEGATIVE
Comment: NEGATIVE
Comment: NEGATIVE
Comment: NORMAL
Neisseria Gonorrhea: NEGATIVE
Trichomonas: NEGATIVE

## 2020-07-26 LAB — HIV ANTIBODY (ROUTINE TESTING W REFLEX): HIV Screen 4th Generation wRfx: NONREACTIVE

## 2020-07-26 LAB — BETA HCG QUANT (REF LAB): hCG Quant: <1 m[IU]/mL

## 2020-07-26 LAB — CYTOLOGY - PAP
Comment: NEGATIVE
Diagnosis: NEGATIVE
High risk HPV: NEGATIVE

## 2020-07-26 LAB — RPR: RPR Ser Ql: NONREACTIVE

## 2020-07-26 LAB — HEPATITIS C ANTIBODY: Hep C Virus Ab: <0.1 s/co ratio (ref 0.0–0.9)

## 2020-07-26 LAB — HEPATITIS B SURFACE ANTIGEN: Hepatitis B Surface Ag: NEGATIVE

## 2020-07-29 NOTE — Progress Notes (Signed)
Please call pt to notify of normal results for all testing. If patient is continuing to have symptoms, she should be scheduled in the office with MD only.  Thank you, Elmyra Ricks

## 2020-08-02 ENCOUNTER — Encounter: Payer: Self-pay | Admitting: Podiatry

## 2020-08-02 ENCOUNTER — Other Ambulatory Visit: Payer: Self-pay

## 2020-08-02 ENCOUNTER — Ambulatory Visit (INDEPENDENT_AMBULATORY_CARE_PROVIDER_SITE_OTHER): Payer: Medicaid Other | Admitting: Podiatry

## 2020-08-02 DIAGNOSIS — B351 Tinea unguium: Secondary | ICD-10-CM

## 2020-08-02 DIAGNOSIS — B353 Tinea pedis: Secondary | ICD-10-CM

## 2020-08-02 MED ORDER — CICLOPIROX 8 % EX SOLN: Freq: Every day | CUTANEOUS | 2 refills | Status: DC

## 2020-08-02 MED ORDER — KETOCONAZOLE 2 % EX CREA: 1.0000 "application " | TOPICAL_CREAM | Freq: Every day | CUTANEOUS | 2 refills | Status: DC

## 2020-08-02 MED ORDER — TERBINAFINE HCL 250 MG PO TABS: 250.0000 mg | ORAL_TABLET | Freq: Every day | ORAL | 0 refills | Status: AC

## 2020-08-05 ENCOUNTER — Encounter: Payer: Self-pay | Admitting: Podiatry

## 2020-08-05 NOTE — Progress Notes (Signed)
  Subjective:  Patient ID: Janice Crawford, female    DOB: 12/23/1975,  MRN: [id number]  Chief Complaint  Patient presents with   Nail Problem     (np) Bil toenail fungus    45 y.o. female presents with the above complaint. History confirmed with patient.  Has had discolored toenails and is interested in having it treated.  Also has dry scaling skin on both feet.  Objective:  Physical Exam: warm, good capillary refill, no trophic changes or ulcerative lesions, normal DP and PT pulses, and normal sensory exam.  No polyps of most toes, the right hallux nail is yellow discolored and brown with subungual debris.  Scaling tinea rash on both feet     Assessment:   1. Onychomycosis   2. Tinea pedis of both feet      Plan:  Patient was evaluated and treated and all questions answered.  Reviewed treatment options for onychomycosis including oral and topical treatment.  Recommend topical and oral treatment with ciclopirox and terbinafine.  Photographs were taken.  Discussed the etiology and treatment options for tinea pedis.  Discussed topical and oral treatment.  Recommended topical treatment with 2% ketoconazole cream.  This was sent to the patient's pharmacy.  Also discussed appropriate foot hygiene, use of antifungal spray such as Tinactin in shoes, as well as cleaning her foot surfaces such as showers and bathroom floors with bleach.   Return in about 4 months (around 12/02/2020) for follow up nail fungus and athlete's foot treatment .

## 2020-09-26 ENCOUNTER — Other Ambulatory Visit: Payer: Self-pay

## 2020-09-26 ENCOUNTER — Encounter (HOSPITAL_COMMUNITY): Payer: Self-pay | Admitting: Emergency Medicine

## 2020-09-26 ENCOUNTER — Ambulatory Visit (HOSPITAL_COMMUNITY)
Admission: EM | Admit: 2020-09-26 | Discharge: 2020-09-26 | Disposition: A | Discharge status: Home / Self Care | Payer: Medicaid Other | Attending: Student | Admitting: Student

## 2020-09-26 ENCOUNTER — Ambulatory Visit (HOSPITAL_BASED_OUTPATIENT_CLINIC_OR_DEPARTMENT_OTHER)
Admission: RE | Admit: 2020-09-26 | Discharge: 2020-09-26 | Disposition: A | Discharge status: Home / Self Care | Payer: Medicaid Other | Source: Ambulatory Visit | Attending: *Deleted | Admitting: *Deleted

## 2020-09-26 DIAGNOSIS — M25572 Pain in left ankle and joints of left foot: Secondary | ICD-10-CM

## 2020-09-26 DIAGNOSIS — Z86711 Personal history of pulmonary embolism: Secondary | ICD-10-CM

## 2020-09-26 DIAGNOSIS — Z86718 Personal history of other venous thrombosis and embolism: Secondary | ICD-10-CM

## 2020-09-26 DIAGNOSIS — R600 Localized edema: Secondary | ICD-10-CM | POA: Insufficient documentation

## 2020-09-26 DIAGNOSIS — R609 Edema, unspecified: Secondary | ICD-10-CM | POA: Diagnosis not present

## 2020-09-26 LAB — URIC ACID: Uric Acid, Serum: 5.9 mg/dL (ref 2.5–7.1)

## 2020-09-26 NOTE — Progress Notes (Signed)
Left lower extremity venous duplex completed. Refer to "CV Proc" under chart review to view preliminary results.  09/26/2020 3:54 PM Kelby Aline., MHA, RVT, RDCS, RDMS

## 2020-09-26 NOTE — ED Triage Notes (Signed)
Pt presents with left ankle pian that started yesterday morning. States pain is worst with walking.
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
## 2020-09-26 NOTE — Discharge Instructions (Addendum)
-  Attend ultrasound appointment to check for blood clot. If this is positive, I'll call you and send a blood thinner to your pharmacy. You'd then have to follow-up with your PCP. -If you do have a clot in your leg, and you develop new symptoms like chest pain, shortness of breath, dizziness- head to the ED or call 911. This could mean it's spread to your lungs. -Tylenol/ibuprofen, rest, ice -If ultrasound is negative and symptoms persist in 3 days, follow-up with an orthopedist. I recommend [facility] at [address], [place], [place] [address]. You can schedule an appointment by calling [phone]) or online ([email or web address]), but they also have a walk-in clinic M-F 8a-8p and Sat 10a-3p.

## 2020-09-26 NOTE — ED Notes (Signed)
Appt scheduled for [place] at 3pm at [facility]

## 2020-11-27 ENCOUNTER — Ambulatory Visit: Payer: Medicaid Other | Admitting: Podiatry

## 2020-12-24 ENCOUNTER — Telehealth: Payer: Self-pay | Admitting: Podiatry

## 2020-12-24 NOTE — Telephone Encounter (Signed)
Pt called and requested a refill on the nail polish for fungus.

## 2020-12-25 MED ORDER — CICLOPIROX 8 % EX SOLN: Freq: Every day | CUTANEOUS | 2 refills | Status: DC

## 2020-12-25 NOTE — Addendum Note (Signed)
Addended bySherryle Lis, Samael Blades R on: 12/25/2020 09:24 AM   Modules accepted: Orders

## 2021-05-13 ENCOUNTER — Other Ambulatory Visit: Payer: Self-pay | Admitting: *Deleted

## 2021-05-13 DIAGNOSIS — C50312 Malignant neoplasm of lower-inner quadrant of left female breast: Secondary | ICD-10-CM

## 2021-05-14 ENCOUNTER — Inpatient Hospital Stay: Payer: Medicaid Other | Admitting: Hematology and Oncology

## 2021-05-14 ENCOUNTER — Inpatient Hospital Stay: Payer: Medicaid Other | Attending: Physician Assistant

## 2021-05-14 NOTE — Progress Notes (Incomplete)
? ?Patient Care Team: ?Ann Held as PCP - General (Physician Assistant) ?[name], MD as Consulting Physician (General Surgery) ?Nicholas Lose, MD as Consulting Physician (Hematology and Oncology) ?Arloa Koh, MD (Inactive) as Consulting Physician (Radiation Oncology) ?Sylvan Cheese, NP as Nurse Practitioner (Nurse Practitioner) ? ?DIAGNOSIS:  ?Encounter Diagnosis  ?Name Primary?  ? Malignant neoplasm of lower-inner quadrant of left breast in female, estrogen receptor positive ([place])   ? ? ?SUMMARY OF ONCOLOGIC HISTORY: ?Oncology History  ?Cancer of lower-inner quadrant of left female breast ([place])  ?12/06/2013 Breast MRI  ? Left breast middle depth 11 mm irregular mass no abnormal lymph nodes seen, Right breast lower inner quadrant 1.3 cm area; multiple cystic lesions throughout the breast on both sides ?  ?12/16/2013 Initial Biopsy  ? LEFT breast needle core bx: Invasive mammary carcinoma with mammary carcinoma in situ; lobular features, grade 2, ER+ (90%), PR+ (90%), Ki-67 20%, HER-2 negative (ratio 1.33) ?  ?12/16/2013 Initial Biopsy  ? RIGHT breast needle core bx: intraductal papilloma. ?  ?12/16/2013 Clinical Stage  ? Stage IA: T1c N0 ?  ?12/29/2013 Procedure  ? 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  ?  ?04/19/2014 Definitive Surgery  ? Left lumpectomy [facility]): IDC grade 2, 1.5 cm, intermediate grade DCIS, 0/2 lymph nodes, ER 90%, PR 90%, HER-2 negative ratio 1.33, Ki-67 20%,   ?  ?04/19/2014 Oncotype testing  ? Recurrence score 23 (15% ROR). No chemotherapy Lindi Adie). ?  ?04/19/2014 Pathologic Stage  ? LEFT lumpectomy with SLNB [facility] Dienes): Invasive ductal carcinoma, grade 2/3, 1.5 cm, DCIS intermediate grade.  2 LN negative for malignancy (0/2). RIGHT lumpectomy: fibrocystic changes ?  ?06/08/2014 - 07/28/2014 Radiation Therapy  ? Adjuvant RT completed Valere Dross).  Left breast 45 Gy over 25  fractions. Left breast boost 18 cGy over 9 fractions.  Total dose: 60 Gy. ?  ? Anti-estrogen oral therapy  ? Tamoxifen 20 mg daily ([place]).  Planned duration of therapy 10 years. ?  ?11/17/2014 Survivorship  ? Survivorship visit completed and copy of survivorship care plan provided to patient. ?  ?01/12/2015 - 01/15/2015 Hospital Admission  ?  acute pulmonary embolism , on xarelto ?  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 PALB2-related breast cancer ?  ? ? ?CHIEF COMPLIANT: Follow-up to review recent scans ? ? ?INTERVAL HISTORY: Janice Crawford is a ?46 y.o. female with above-mentioned history of left breast cancer and acute pulmonary embolism. CT CAP on 07/13/18 showed no evidence of recurrent or metastatic disease and indeterminate irregular soft tissue in the 6 o'clock position of the right breast. She presents to the clinic today for a follow-up ? ? ?ALLERGIES:  is allergic to latex. ? ?MEDICATIONS:  ?Current Outpatient Medications  ?Medication Sig Dispense Refill  ? ciclopirox (PENLAC) 8 % solution Apply topically at bedtime. Apply over nail and surrounding skin. Apply daily over previous coat. After seven (7) days, may remove with alcohol and continue cycle. 6.6 mL 2  ? ketoconazole (NIZORAL) 2 % cream Apply 1 application topically daily. 60 g 2  ? ?No current facility-administered medications for this visit.  ? ? ?PHYSICAL EXAMINATION: ?ECOG PERFORMANCE STATUS: {CHL ONC ECOG [id number]} ? ?There were no vitals filed for this visit. ?There were no vitals filed for this visit. ? ?BREAST:*** No palpable masses or nodules in either right or left breasts. No palpable axillary supraclavicular or infraclavicular adenopathy no breast tenderness or nipple discharge. (exam  performed in the presence of a chaperone) ? ?LABORATORY DATA:  ?I have reviewed the data as listed ?CMP Latest Ref Rng & Units 07/13/2018 09/10/2017 08/26/2015  ?Glucose 70 - 99 mg/dL 99 108(H) 97  ?BUN 6 - 20 mg/dL _0 ?Creatinine 0.44 - 1.00 mg/dL 0.85 0.89 0.79  ?Sodium 135 - 145 mmol/L 137 138 135  ?Potassium 3.5 - 5.1 mmol/L 3.7 3.5 3.3(L)  ?Chloride 98 - 111 mmol/L 103 102 103  ?CO2 22 - 32 mmol/L _1 ?Calcium 8.9 - 10.3 mg/dL 8.7(L) 9.3 8.7(L)  ?Total Protein 6.5 - 8.1 g/dL 7.7 7.4 6.6  ?Total Bilirubin 0.3 - 1.2 mg/dL 0.4 0.5 0.7  ?Alkaline Phos 38 - 126 U/L 55 54 43  ?AST 15 - 41 U/L 14(L) 16 15  ?ALT 0 - 44 U/L 13 12 11(L)  ? ? ?Lab Results  ?Component Value Date  ? WBC 7.4 07/13/2018  ? HGB 11.5 (L) 07/13/2018  ? HCT 35.3 (L) 07/13/2018  ? MCV 89.8 07/13/2018  ? PLT 311 07/13/2018  ? NEUTROABS 4.3 07/13/2018  ? ? ?ASSESSMENT & PLAN:  ?No problem-specific Assessment & Plan notes found for this encounter. ? ? ? ?No orders of the defined types were placed in this encounter. ? ?The patient has a good understanding of the overall plan. she agrees with it. she will call with any problems that may develop before the next visit here. ?Total time spent: 30 mins including face to face time and time spent for planning, charting and co-ordination of care ? ? Suzzette Righter, CMA ?05/14/21 ? ? ? I Anabelen Kaminsky, Kaydon Creedon am scribing for Dr. Lindi Adie ? ?***  ?

## 2021-05-14 NOTE — Assessment & Plan Note (Deleted)
Left lumpectomy 04/19/2014: IDC grade 2, 1.5 cm, intermediate grade DCIS, 0/2 lymph nodes, ER 90%, PR 90%, HER-2 negative ratio 1.33, Ki-67 20%,? Oncotype DX recurrence score 23, 15% ROR ?Right lumpectomy: Fibrocystic changes no malignancy. ?Completed adjuvant radiation 06/08/2014 to 07/28/2014 (Left breast?? 4500 cGy in 25 sessions, left breast boost 1800 cGy in 9 sessions), started tamoxifen in June 2016?  ?? ?PALB2 mutation: patient will need annual mammograms and breast MRIs for surveillance.??I ordered her breast MRI to be done in the next month ?? ?Current treatment: Surveillance. ?1.??Mammogram 03/21/2020 mammogram at [facility]: Patient had palpable mass retroareolar region.  No definite mass by mammogram, ultrasound was recommended ?2.??Breast MRI: Needs to be scheduled  ?3.  CT chest abdomen pelvis: No evidence of metastatic disease, right breast cystic lesion needs to be further evaluated.  She will have a breast MRI and it can evaluate bilateral breasts. ?

## 2021-06-29 ENCOUNTER — Other Ambulatory Visit: Payer: Self-pay

## 2021-06-29 ENCOUNTER — Emergency Department (HOSPITAL_COMMUNITY)
Admission: EM | Admit: 2021-06-29 | Discharge: 2021-06-29 | Disposition: A | Discharge status: Home / Self Care | Payer: Medicaid Other | Attending: Emergency Medicine | Admitting: Emergency Medicine

## 2021-06-29 ENCOUNTER — Encounter (HOSPITAL_COMMUNITY): Payer: Self-pay | Admitting: Emergency Medicine

## 2021-06-29 DIAGNOSIS — H1033 Unspecified acute conjunctivitis, bilateral: Secondary | ICD-10-CM | POA: Diagnosis not present

## 2021-06-29 DIAGNOSIS — Z91040 Latex allergy status: Secondary | ICD-10-CM | POA: Insufficient documentation

## 2021-06-29 DIAGNOSIS — H5789 Other specified disorders of eye and adnexa: Secondary | ICD-10-CM | POA: Diagnosis present

## 2021-06-29 MED ORDER — ERYTHROMYCIN 5 MG/GM OP OINT: TOPICAL_OINTMENT | OPHTHALMIC | 0 refills | Status: DC

## 2021-06-29 MED ORDER — FLUORESCEIN SODIUM 1 MG OP STRP
1.0000 | ORAL_STRIP | Freq: Once | OPHTHALMIC | Status: AC
  Administered 2021-06-29: 1 via OPHTHALMIC
  Filled 2021-06-29: qty 1

## 2021-06-29 NOTE — ED Provider Notes (Signed)
?[facility] ?Provider Note ? ? ?CSN: [id number] ?Arrival date & time: 06/29/21  1025 ? ?  ? ?History ? ?Chief Complaint  ?Patient presents with  ? Eye Problem  ? ? ?Janice Crawford is a 46 y.o. female. ? ?HPI ? ?Without significant medical history presents with complaints of red eyes.  Patient states that this started on Tuesday, initially start with her right eye, states she had some redness and some gritty like sensation in her eye, with some clearish drainage, this then moved into her left eye, now they both have clearish drainage.  She denies visual changes but states that she will have some slight blurry when she is on read something small, she denies any trauma to her eye, does not wear eye contacts, noted with eye movement, denies any new facial scrubs or cosmetics on her eyes, she has never had this in the past.  She has tried allergy medication and much relief.  She has not seen eye doctor. ? ?Home Medications ?Prior to Admission medications   ?Medication Sig Start Date End Date Taking? Authorizing Provider  ?erythromycin ophthalmic ointment Place a 1/2 inch ribbon of ointment into the lower eyelid.  4 times a day for the next 5 days 06/29/21  Yes Marcello Fennel, PA-C  ?ciclopirox [facility]) 8 % solution Apply topically at bedtime. Apply over nail and surrounding skin. Apply daily over previous coat. After seven (7) days, may remove with alcohol and continue cycle. 12/25/20   Criselda Peaches, DPM  ?ketoconazole (NIZORAL) 2 % cream Apply 1 application topically daily. 08/02/20   Criselda Peaches, DPM  ?   ? ?Allergies    ?Latex   ? ?Review of Systems   ?Review of Systems  ?Constitutional:  Negative for chills and fever.  ?Eyes:  Positive for discharge and redness.  ?Respiratory:  Negative for shortness of breath.   ?Cardiovascular:  Negative for chest pain.  ?Gastrointestinal:  Negative for abdominal pain.  ?Neurological:  Negative for headaches.  ? ?Physical Exam ?Updated Vital Signs ?BP  (!) 139/100 (BP Location: Right Arm)   Pulse 100   Temp 99.8 ?F (37.7 ?C) (Oral)   Resp 18   Ht '5\' 4"'$  (1.626 m)   Wt 111.1 kg   LMP 05/24/2021 (Approximate)   SpO2 98%   BMI 42.05 kg/m?  ?Physical Exam ?Vitals and nursing note reviewed.  ?Constitutional:   ?   General: She is not in acute distress. ?   Appearance: She is not ill-appearing.  ?HENT:  ?   Head: Normocephalic and atraumatic.  ?   Nose: No congestion.  ?Eyes:  ?   General:     ?   Right eye: Discharge present.     ?   Left eye: Discharge present. ?   Extraocular Movements: Extraocular movements intact.  ?   Conjunctiva/sclera: Conjunctivae normal.  ?   Pupils: Pupils are equal, round, and reactive to light.  ?   Comments: EOMs PERRLA, there is no noted periorbital edema or overlying skin changes, she has slight clearish drainage coming from both of her eyes, she had sclera injections bilaterally, worse on the right versus the left, no dendritic lesion, no blood in the anterior chain of the eye.  No proptosis present.  ?Cardiovascular:  ?   Rate and Rhythm: Normal rate and regular rhythm.  ?   Pulses: Normal pulses.  ?   Heart sounds: No murmur heard. ?  No friction rub. No gallop.  ?  Pulmonary:  ?   Effort: No respiratory distress.  ?   Breath sounds: No wheezing, rhonchi or rales.  ?Skin: ?   General: Skin is warm and dry.  ?Neurological:  ?   Mental Status: She is alert.  ?   Comments: No facial asymmetry, no difficulty with word finding, following two-step commands, no unilateral weakness present.  ?Psychiatric:     ?   Mood and Affect: Mood normal.  ? ? ?ED Results / Procedures / Treatments   ?Labs ?(all labs ordered are listed, but only abnormal results are displayed) ?Labs Reviewed - No data to display ? ?EKG ?None ? ?Radiology ?No results found. ? ?Procedures ?Procedures  ? ? ?Medications Ordered in ED ?Medications  ?fluorescein ophthalmic strip 1 strip (1 strip Both Eyes Given 06/29/21 1116)  ? ? ?ED Course/ Medical Decision Making/ A&P ?  ?                         ?Medical Decision Making ?Risk ?Prescription drug management. ? ? ?This patient presents to the ED for concern of eye redness, this involves an extensive number of treatment options, and is a complaint that carries with it a high risk of complications and morbidity.  The differential diagnosis includes viral/bacterial conjunctivitis, corneal abrasion, corneal ulcer ? ? ? ?Additional history obtained: ? ?Additional history obtained from N/A ?External records from outside source obtained and reviewed including N/A ? ? ?Co morbidities that complicate the patient evaluation ? ?N/A ? ?Social Determinants of Health: ? ?Does not have an ophthalmologist will provide own with discharge ? ? ? ?Lab Tests: ? ?I Ordered, and personally interpreted labs.  The pertinent results include:  ? ?  Visual Acuity ? ?Right Eye Distance: 20/20 ?Left Eye Distance: 20/25 ?Bilateral Distance: 20/20 ?   ? ? ? ?Imaging Studies ordered: ? ?I ordered imaging studies including N/A ?I independently visualized and interpreted imaging which showed N/A ?I agree with the radiologist interpretation ? ? ?Cardiac Monitoring: ? ?The patient was maintained on a cardiac monitor.  I personally viewed and interpreted the cardiac monitored which showed an underlying rhythm of: N/A ? ? ?Medicines ordered and prescription drug management: ? ?I ordered medication including fluorescein stain ?I have reviewed the patients home medicines and have made adjustments as needed ? ?Critical Interventions: ? ?N/A ? ? ?Reevaluation: ? ?Presents with red eyes, she had scleral injections bilaterally, no other acute abnormalities, visual acuity was obtained and was unremarkable.  Will obtain [employer] evaluation ? ?Woods lamp evaluation was unremarkable, she has no complaints she is ready for discharge. ? ?Consultations Obtained: ? ?N/A ? ? ?Test Considered: ? ?CT maxillofacial will defer as my suspicion for maxillofacial infection is very low at this  time.  Presentation more consistent with viral/bacterial conjunctivitis ? ? ? ?Rule out ?I have low suspicion for orbital cellulitis or periseptal cellulitis as patient had no pain with EOMs, there is no induration, fluctuance noted on exam around eyes.  Low suspicion for keratitis as patient does not wear contacts, eyes were visualized there is no visible dendritic lesion noted, there is no severe amount of purulent discharge noted.   Low suspicion for hyphema as patient denies recent trauma to the area no blood noted in the anterior chamber.  Low suspicion for corneal abrasion as there is no fluorescein uptake noted my exam.  Low suspicion for CVA presentation atypical, no focal deficits, presentation is more consistent with conjunctivitis. ? ? ? ? ?  Dispostion and problem list ? ?After consideration of the diagnostic results and the patients response to treatment, I feel that the patent would benefit from discharge. ? ?Right eyes-likely this is viral conjunctivitis but I am concerned for developing bacterial conjunctivitis, will start her on antibiotics, follow-up with ophthalmology for further evaluation and strict return precautions. ? ? ? ? ? ? ? ? ? ? ? ?Final Clinical Impression(s) / ED Diagnoses ?Final diagnoses:  ?Acute conjunctivitis of both eyes, unspecified acute conjunctivitis type  ? ? ?Rx / DC Orders ?ED Discharge Orders   ? ?      Ordered  ?  erythromycin ophthalmic ointment       ? 06/29/21 1134  ? ?  ?  ? ?  ? ? ?  ?Marcello Fennel, PA-C ?06/29/21 1135 ? ?  ?Lajean Saver, MD ?06/30/21 [phone] ? ?

## 2021-06-29 NOTE — ED Triage Notes (Signed)
Pt to the ED with complaints of bilateral eye redness and pain that began on Tuesday. ? ?

## 2021-06-29 NOTE — Discharge Instructions (Signed)
Likely of conjunctivitis, have started on antibiotics please take as prescribed.  May use over-the-counter eyedrops to help flush out your eyes. ? ?Please follow-up with ophthalmology for further evaluation ? ?Come back to the emergency department if you develop chest pain, shortness of breath, severe abdominal pain, uncontrolled nausea, vomiting, diarrhea. ? ? ?

## 2022-01-05 ENCOUNTER — Encounter (HOSPITAL_COMMUNITY): Payer: Self-pay | Admitting: *Deleted

## 2022-01-05 ENCOUNTER — Other Ambulatory Visit: Payer: Self-pay

## 2022-01-05 ENCOUNTER — Emergency Department (HOSPITAL_COMMUNITY)
Admission: EM | Admit: 2022-01-05 | Discharge: 2022-01-05 | Disposition: A | Discharge status: Home / Self Care | Payer: Self-pay | Attending: Emergency Medicine | Admitting: Emergency Medicine

## 2022-01-05 DIAGNOSIS — Z91040 Latex allergy status: Secondary | ICD-10-CM | POA: Insufficient documentation

## 2022-01-05 DIAGNOSIS — S70361A Insect bite (nonvenomous), right thigh, initial encounter: Secondary | ICD-10-CM | POA: Insufficient documentation

## 2022-01-05 DIAGNOSIS — L03115 Cellulitis of right lower limb: Secondary | ICD-10-CM

## 2022-01-05 DIAGNOSIS — W57XXXA Bitten or stung by nonvenomous insect and other nonvenomous arthropods, initial encounter: Secondary | ICD-10-CM

## 2022-01-05 MED ORDER — CEPHALEXIN 500 MG PO CAPS: 500.0000 mg | ORAL_CAPSULE | Freq: Four times a day (QID) | ORAL | 0 refills | Status: DC

## 2022-01-05 MED ORDER — DIPHENHYDRAMINE HCL 25 MG PO TABS: 25.0000 mg | ORAL_TABLET | Freq: Three times a day (TID) | ORAL | 0 refills | Status: DC | PRN

## 2022-01-05 NOTE — Discharge Instructions (Signed)
Thank you for coming to [facility] Emergency Department. You were seen for insect bite. We did an exam, and This showed an insect bite with probable cellulitis around it. Please take benadryl 25 mg by mouth every 6-8 hours as needed for itching. You can also take keflex four times per day for the skin infection.  Please follow up with your primary care provider within 1 week.   Do not hesitate to return to the ED or call 911 if you experience: -Worsening symptoms -Lightheadedness, passing out -Fevers/chills -Anything else that concerns you

## 2022-01-05 NOTE — ED Triage Notes (Signed)
Pt believes she was bit by spider 2 days ago.  Feel "feverish" last night.  Redness and swelling noted to site.

## 2022-01-05 NOTE — ED Provider Notes (Signed)
[facility] EMERGENCY DEPARTMENT Provider Note   CSN: [id number] Arrival date & time: 01/05/22  1022     History  Chief Complaint  Patient presents with   Insect Bite    Janice Crawford is a 46 y.o. 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  Patient p/w thinks she was bit by an insect 2 days ago. This has happened before, but this time she didn't find the insect in her pants. Located posterior/medial right upper thigh. Had some local irritation and swelling/redness initially with itching. Then has been painful and had a redness around it yesterday. When this happened before she had an infection from a bite and was treated with antibiotics. She has felt a little feverish at home and so came to the ED. No measured fever. No purulent drainage or abscess. No bleeding, scab, eschar.  She has been treating at home with tylenol and witch hazel.   HPI     Home Medications Prior to Admission medications   Medication Sig Start Date End Date Taking? Authorizing Provider  ciclopirox (PENLAC) 8 % solution Apply topically at bedtime. Apply over nail and surrounding skin. Apply daily over previous coat. After seven (7) days, may remove with alcohol and continue cycle. 12/25/20   McDonald, Stephan Minister, DPM  erythromycin ophthalmic ointment Place a 1/2 inch ribbon of ointment into the lower eyelid.  4 times a day for the next 5 days 06/29/21   Marcello Fennel, PA-C  ketoconazole (NIZORAL) 2 % cream Apply 1 application topically daily. 08/02/20   Criselda Peaches, DPM      Allergies    Latex    Review of Systems   Review of Systems Review of systems negative for SOB, wheezing, hives.  A 10 point review of systems was performed and is negative unless otherwise reported in HPI.  Physical Exam Updated Vital Signs BP (!) 164/96 (BP Location: Right Arm)   Pulse 90   Temp 98.2 F (36.8 C) (Oral)   Resp 18   Ht '5\' 4"'$  (1.626 m)   Wt 111.1 kg   LMP 12/29/2021   SpO2  100%   BMI 42.05 kg/m  Physical Exam General: Normal appearing female, sitting in bed HEENT: Sclera anicteric, MMM, trachea midline. Cardiology: RRR, no murmurs/rubs/gallops. BL radial and DP pulses equal bilaterally.  Resp: Normal respiratory rate and effort. CTAB, no wheezes, rhonchi, crackles.  Abd: Soft, non-tender, non-distended. No rebound tenderness or guarding.  MSK: No peripheral edema or signs of trauma. No cyanosis or clubbing. Skin: 2 x 2 cm erythema/warmth around a 0.5 x 0.5 cm raised erythematous papule with pruritus reproduced on exam and mild TTP. No open wounds, no purulent drainage from area.  Neuro: A&Ox4, CNs II-XII grossly intact. MAEs.  Psych: Normal mood and affect.   ED Results / Procedures / Treatments    Procedures Procedures    Medications Ordered in ED Medications - No data to display  ED Course/ Medical Decision Making/ A&P                          Medical Decision Making Risk OTC drugs. Prescription drug management.    Patient w/ redness and mild TTP in an area with insect bite. Likely patient with local reaction to bite w/ pururitis/inflammation but also with area of warmth and pain, could represent cellulitis of area, and patient does have a history of similar stating this is similar  to that time. Will treat with benadryl 25 mg q6-8h prn for a few days for itching/histaminergic inflammation as well as keflex x 5 days for cellulitis. Patient is overall well appearing and afebrile, low c/f systemic infection or sepsis. No hives, wheezing, SOB, N/V, or hypotension to indicate anaphylaxis. Instructed patient to continue w/ tylenol. She is instructed to monitor for drowsiness from the benadryl and do not drive or operate heavy machinery if she is drowsy.  Instructed to follow-up with her primary care physician in a week.  Patient reports understanding, all questions answered to patient satisfaction.  Dispo: DC             Final Clinical  Impression(s) / ED Diagnoses Final diagnoses:  Insect bite of right thigh, initial encounter  Cellulitis of right lower extremity    Rx / DC Orders ED Discharge Orders          Ordered    diphenhydrAMINE (BENADRYL) 25 MG tablet  Every 8 hours PRN        01/05/22 1324    cephALEXin (KEFLEX) 500 MG capsule  4 times daily        01/05/22 1324             This note was created using dictation software, which may contain spelling or grammatical errors.    Audley Hose, MD 01/05/22 1800

## 2022-06-14 ENCOUNTER — Emergency Department (HOSPITAL_COMMUNITY)
Admission: EM | Admit: 2022-06-14 | Discharge: 2022-06-14 | Disposition: A | Discharge status: Home / Self Care | Payer: Self-pay | Attending: Emergency Medicine | Admitting: Emergency Medicine

## 2022-06-14 ENCOUNTER — Other Ambulatory Visit: Payer: Self-pay

## 2022-06-14 ENCOUNTER — Encounter (HOSPITAL_COMMUNITY): Payer: Self-pay

## 2022-06-14 DIAGNOSIS — Z91040 Latex allergy status: Secondary | ICD-10-CM | POA: Insufficient documentation

## 2022-06-14 DIAGNOSIS — U071 COVID-19: Secondary | ICD-10-CM | POA: Insufficient documentation

## 2022-06-14 LAB — RESP PANEL BY RT-PCR (RSV, FLU A&B, COVID)  RVPGX2
Influenza A by PCR: NEGATIVE
Influenza B by PCR: NEGATIVE
Resp Syncytial Virus by PCR: NEGATIVE
SARS Coronavirus 2 by RT PCR: POSITIVE — AB

## 2022-06-14 LAB — GROUP A STREP BY PCR: Group A Strep by PCR: NOT DETECTED

## 2022-06-14 MED ORDER — IBUPROFEN 400 MG PO TABS
600.0000 mg | ORAL_TABLET | Freq: Once | ORAL | Status: AC
  Administered 2022-06-14: 600 mg via ORAL
  Filled 2022-06-14: qty 2

## 2022-06-14 MED ORDER — PAXLOVID (300/100) 20 X 150 MG & 10 X 100MG PO TBPK: 3.0000 | ORAL_TABLET | Freq: Two times a day (BID) | ORAL | 0 refills | Status: DC

## 2022-06-14 NOTE — ED Notes (Signed)
Pt states she has changed her mind and does not want plaxlovid RX, provider aware

## 2022-06-14 NOTE — ED Triage Notes (Signed)
Pt endorses fever, chills, body aches, fatigue x 3 days. Family members also recently sick with same symptoms.   Has been taking dayquil with no relief.

## 2022-06-14 NOTE — Discharge Instructions (Addendum)
You have COVID-19, you can use Tylenol Motrin as needed for body aches and chills, drink plenty of fluids and rest, follow-up with your primary care doctor, come back to the ER for any new or worsening symptoms.

## 2022-06-14 NOTE — ED Provider Notes (Signed)
[place] EMERGENCY DEPARTMENT AT [facility] Provider Note   CSN: [id number] Arrival date & time: 06/14/22  1819     History  Chief Complaint  Patient presents with   Sore Throat   Fever    Janice Crawford is a 47 y.o. female.  Denies any chronic PMH.  Presents the ER for 3 days of bodyaches, chills sinus congestion and sore throat.  She states her daughter and another family member had similar symptoms.  She is been taking DayQuil without relief.  She last had medication last night.  She also reports a mild cough, denies shortness of breath or sputum production   Sore Throat  Fever      Home Medications Prior to Admission medications   Medication Sig Start Date End Date Taking? Authorizing Provider  cephALEXin (KEFLEX) 500 MG capsule Take 1 capsule (500 mg total) by mouth 4 (four) times daily. 01/05/22   Loetta Rough, MD  ciclopirox (PENLAC) 8 % solution Apply topically at bedtime. Apply over nail and surrounding skin. Apply daily over previous coat. After seven (7) days, may remove with alcohol and continue cycle. 12/25/20   McDonald, Rachelle Hora, DPM  diphenhydrAMINE (BENADRYL) 25 MG tablet Take 1 tablet (25 mg total) by mouth every 8 (eight) hours as needed for up to 5 days for itching. 01/05/22 01/10/22  Loetta Rough, MD  erythromycin ophthalmic ointment Place a 1/2 inch ribbon of ointment into the lower eyelid.  4 times a day for the next 5 days 06/29/21   Carroll Sage, PA-C  ketoconazole (NIZORAL) 2 % cream Apply 1 application topically daily. 08/02/20   Edwin Cap, DPM      Allergies    Latex    Review of Systems   Review of Systems  Constitutional:  Positive for fever.    Physical Exam Updated Vital Signs BP (!) 141/92 (BP Location: Right Arm)   Pulse 99   Temp (!) 100.5 F (38.1 C) (Oral)   Resp 19   Ht  (1.626 m)   Wt 106.6 kg   SpO2 100%   BMI 40.34 kg/m  Physical Exam Vitals and nursing note reviewed.  Constitutional:       General: She is not in acute distress.    Appearance: She is well-developed.  HENT:     Head: Normocephalic and atraumatic.     Right Ear: Tympanic membrane normal.     Left Ear: Tympanic membrane normal.     Mouth/Throat:     Mouth: Mucous membranes are moist.     Pharynx: Uvula midline. Posterior oropharyngeal erythema present. No oropharyngeal exudate or uvula swelling.     Tonsils: No tonsillar exudate or tonsillar abscesses. 2+ on the right. 2+ on the left.  Eyes:     Conjunctiva/sclera: Conjunctivae normal.  Cardiovascular:     Rate and Rhythm: Normal rate and regular rhythm.     Heart sounds: No murmur heard. Pulmonary:     Effort: Pulmonary effort is normal. No respiratory distress.     Breath sounds: Normal breath sounds.  Abdominal:     Palpations: Abdomen is soft.     Tenderness: There is no abdominal tenderness.  Musculoskeletal:        General: No swelling.     Cervical back: Neck supple.  Skin:    General: Skin is warm and dry.     Capillary Refill: Capillary refill takes less than 2 seconds.  Neurological:  Mental Status: She is alert.  Psychiatric:        Mood and Affect: Mood normal.     ED Results / Procedures / Treatments   Labs (all labs ordered are listed, but only abnormal results are displayed) Labs Reviewed  RESP PANEL BY RT-PCR (RSV, FLU A&B, COVID)  RVPGX2 - Abnormal; Notable for the following components:      Result Value   SARS Coronavirus 2 by RT PCR POSITIVE (*)    All other components within normal limits  GROUP A STREP BY PCR    EKG None  Radiology No results found.  Procedures Procedures    Medications Ordered in ED Medications  ibuprofen (ADVIL) tablet 600 mg (600 mg Oral Given 06/14/22 1952)    ED Course/ Medical Decision Making/ A&P                             Medical Decision Making This patient presents to the ED for concern of throat, body aches and fevers x 3 days, this involves an extensive number of  treatment options, and is a complaint that carries with it a high risk of complications and morbidity.  The differential diagnosis includes influenza COVID-19, pneumonia, tonsillitis, peritonsillar abscess, pharyngeal abscess, viral URI, bronchitis, other   Co morbidities that complicate the patient evaluation  None   Additional history obtained:  Additional history obtained from EMR External records from outside source obtained and reviewed including previous ED visits, prior outpatient oncology visits that revealed patient is status post breast cancer stage 1a treated remotely with a lumpectomy and radiation   Lab Tests:  I Ordered, and personally interpreted labs.  The pertinent results include: Strep, positive COVID-19  Problem List / ED Course / Critical interventions / Medication management  Patient presents for sore throat cough congestion and bodyaches for the past couple of days, has dementia 100.5.  Otherwise well-appearing, well-hydrated, speaking in full clear sentences, handling oral secretions well.  Strep is negative but she is positive for COVID-19.  Has positive sick contacts at home.  She has had this 1 time in the past, has not been vaccinated.  Did discuss with her risk factor of BMI over 40 and risk factors for possible worsening disease.  I offered Paxlovid to help decrease this risk of hospitalization but patient states she does not want to get.  She is advised on supportive care at home, follow-up and return precautions.  She was given a work note.       Risk Prescription drug management.           Final Clinical Impression(s) / ED Diagnoses Final diagnoses:  COVID    Rx / DC Orders ED Discharge Orders          Ordered    nirmatrelvir & ritonavir (PAXLOVID, 300/100,) 20 x 150 MG & 10 x  TBPK  2 times daily,   Status:  Discontinued        06/14/22 2006              Josem Kaufmann 06/14/22 2018    Derwood Kaplan,  MD 06/19/22 [phone]

## 2022-06-24 ENCOUNTER — Encounter (HOSPITAL_COMMUNITY): Payer: Self-pay | Admitting: Emergency Medicine

## 2022-06-24 ENCOUNTER — Emergency Department (HOSPITAL_COMMUNITY)
Admission: EM | Admit: 2022-06-24 | Discharge: 2022-06-24 | Disposition: A | Discharge status: Home / Self Care | Payer: Self-pay | Attending: Emergency Medicine | Admitting: Emergency Medicine

## 2022-06-24 ENCOUNTER — Other Ambulatory Visit: Payer: Self-pay

## 2022-06-24 ENCOUNTER — Encounter (INDEPENDENT_AMBULATORY_CARE_PROVIDER_SITE_OTHER): Payer: Self-pay | Admitting: Physician Assistant

## 2022-06-24 DIAGNOSIS — X500XXA Overexertion from strenuous movement or load, initial encounter: Secondary | ICD-10-CM | POA: Insufficient documentation

## 2022-06-24 DIAGNOSIS — M545 Low back pain, unspecified: Secondary | ICD-10-CM | POA: Insufficient documentation

## 2022-06-24 DIAGNOSIS — Z91040 Latex allergy status: Secondary | ICD-10-CM | POA: Insufficient documentation

## 2022-06-24 MED ORDER — KETOROLAC TROMETHAMINE 60 MG/2ML IM SOLN
60.0000 mg | Freq: Once | INTRAMUSCULAR | Status: AC
  Administered 2022-06-24: 60 mg via INTRAMUSCULAR
  Filled 2022-06-24: qty 2

## 2022-06-24 MED ORDER — METHOCARBAMOL 500 MG PO TABS: 500.0000 mg | ORAL_TABLET | Freq: Two times a day (BID) | ORAL | 0 refills | Status: DC | PRN

## 2022-06-24 MED ORDER — NAPROXEN 500 MG PO TABS: 500.0000 mg | ORAL_TABLET | Freq: Two times a day (BID) | ORAL | 0 refills | Status: DC

## 2022-06-24 NOTE — ED Provider Notes (Signed)
[place] EMERGENCY DEPARTMENT AT [facility] Provider Note   CSN: [id number] Arrival date & time: 06/24/22  2243     History  Chief Complaint  Patient presents with   Back Pain    Janice Crawford is a 47 y.o. female.   Back Pain  This patient is a 47 year old female, she has no prior history of lower back pain or issues other than having an epidural with her children many years ago.  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  The pain has been present since that time worse with movement not radiating down the legs and not associated with numbness weakness fevers or chills, no history of IV drug use or cancer.  She does not have frequent episodes of back pain.  She tried Tylenol without relief.    Home Medications Prior to Admission medications   Medication Sig Start Date End Date Taking? Authorizing Provider  methocarbamol (ROBAXIN) 500 MG tablet Take 1 tablet (500 mg total) by mouth 2 (two) times daily as needed for muscle spasms. 06/24/22  Yes Eber Hong, MD  naproxen (NAPROSYN) 500 MG tablet Take 1 tablet (500 mg total) by mouth 2 (two) times daily with a meal. 06/24/22  Yes Eber Hong, MD  cephALEXin (KEFLEX) 500 MG capsule Take 1 capsule (500 mg total) by mouth 4 (four) times daily. 01/05/22   Loetta Rough, MD  ciclopirox (PENLAC) 8 % solution Apply topically at bedtime. Apply over nail and surrounding skin. Apply daily over previous coat. After seven (7) days, may remove with alcohol and continue cycle. 12/25/20   McDonald, Rachelle Hora, DPM  diphenhydrAMINE (BENADRYL) 25 MG tablet Take 1 tablet (25 mg total) by mouth every 8 (eight) hours as needed for up to 5 days for itching. 01/05/22 01/10/22  Loetta Rough, MD  erythromycin ophthalmic ointment Place a 1/2 inch ribbon of ointment into the lower eyelid.  4 times a day  for the next 5 days 06/29/21   Carroll Sage, PA-C  ketoconazole (NIZORAL) 2 % cream Apply 1 application topically daily. 08/02/20   Edwin Cap, DPM      Allergies    Latex    Review of Systems   Review of Systems  Musculoskeletal:  Positive for back pain.  All other systems reviewed and are negative.   Physical Exam Updated Vital Signs BP (!) 142/97   Pulse 79   Temp 98 F (36.7 C)   Resp 18   Ht 1.626 m (5\' 4" )   Wt 108.9 kg   LMP 06/17/2022   SpO2 97%   BMI 41.20 kg/m  Physical Exam Vitals and nursing note reviewed.  Constitutional:      General: She is not in acute distress.    Appearance: She is well-developed.  HENT:     Head: Normocephalic and atraumatic.     Mouth/Throat:     Pharynx: No oropharyngeal exudate.  Eyes:     General: No scleral icterus.       Right eye: No discharge.        Left eye: No discharge.     Conjunctiva/sclera: Conjunctivae normal.     Pupils: Pupils are equal, round, and reactive to light.  Neck:     Thyroid: No thyromegaly.     Vascular: No JVD.  Cardiovascular:     Rate and Rhythm: Normal rate and regular rhythm.     Heart sounds: Normal heart sounds. No murmur heard.    No friction rub. No gallop.  Pulmonary:     Effort: Pulmonary effort is normal. No respiratory distress.     Breath sounds: Normal breath sounds. No wheezing or rales.  Abdominal:     General: Bowel sounds are normal. There is no distension.     Palpations: Abdomen is soft. There is no mass.     Tenderness: There is no abdominal tenderness.  Musculoskeletal:        General: Tenderness present. Normal range of motion.     Cervical back: Normal range of motion and neck supple.     Comments: Tender to palpation across the lumbar area, this is not midline, more paraspinal on the right  Lymphadenopathy:     Cervical: No cervical adenopathy.  Skin:    General: Skin is warm and dry.     Findings: No erythema or rash.  Neurological:     Mental Status:  She is alert.     Coordination: Coordination normal.     Comments: Normal strength and sensation of the bilateral lower extremities with preserved reflexes at the patellar tendons.  Gait is slightly antalgic  Psychiatric:        Behavior: Behavior normal.     ED Results / Procedures / Treatments   Labs (all labs ordered are listed, but only abnormal results are displayed) Labs Reviewed - No data to display  EKG None  Radiology No results found.  Procedures Procedures    Medications Ordered in ED Medications  ketorolac (TORADOL) injection 60 mg (has no administration in time range)    ED Course/ Medical Decision Making/ A&P                             Medical Decision Making Risk Prescription drug management.   The patient has no acute findings on exam to suggest the need for acute neuroimaging or admission to the hospital.  She will be given a dose of an anti-inflammatory and encouraged to use heat and a muscle relaxer as needed.  She is agreeable, she is low risk for pathologic findings, she understands indications for return        Final Clinical Impression(s) / ED Diagnoses Final diagnoses:  Acute bilateral low back pain without sciatica    Rx / DC Orders ED Discharge Orders          Ordered    naproxen (NAPROSYN) 500 MG tablet  2 times daily with meals        06/24/22 2257    methocarbamol (ROBAXIN) 500 MG tablet  2 times daily PRN        06/24/22 2257              Eber Hong, MD 06/24/22 2300

## 2022-06-24 NOTE — ED Triage Notes (Signed)
Pt c/o lower back pain after picking something heavy up at work.

## 2022-06-24 NOTE — Discharge Instructions (Signed)
Your history and exam is consistent with having a strain of your lower back.  This is similar to a sprain of your ankle and will take the better part of a week to get back to normal although sometimes it can last a little bit longer than that.  If you should develop increasing weakness or numbness of the legs she should return immediately.  Likely the pain will last for the better part of a week, it would get better with heat packs, Naprosyn and Robaxin which I have prescribed, please see your doctor in 3 days for recheck.  Please take Naprosyn, 500mg  by mouth twice daily as needed for pain - this in an antiinflammatory medicine (NSAID) and is similar to ibuprofen - many people feel that it is stronger than ibuprofen and it is easier to take since it is a smaller pill.  Please use this only for 1 week - if your pain persists, you will need to follow up with your doctor in the office for ongoing guidance and pain control.  Please take Robaxin, 500 mg up to 2 or 3 times a day as needed for muscle spasm, this is a muscle relaxer, it may cause generalized weakness, sleepiness and you should not drive or do important things while taking this medication.  This includes driving a vehicle or taking care of young children, these things should not be done while taking this medication.  Thank you for allowing [place] to treat you in the emergency department today.  After reviewing your examination and potential testing that was done it appears that you are safe to go home.  I would like for you to follow-up with your doctor within the next several days, have them obtain your results and follow-up with them to review all of these tests.  If you should develop severe or worsening symptoms return to the emergency department immediately

## 2022-12-22 ENCOUNTER — Encounter: Payer: Self-pay | Admitting: Genetic Counselor

## 2023-04-09 ENCOUNTER — Telehealth: Payer: Self-pay | Admitting: Physician Assistant

## 2023-04-09 NOTE — Telephone Encounter (Signed)
LVM - Asking patient to call me back.

## 2023-04-29 ENCOUNTER — Encounter: Payer: Self-pay | Admitting: Genetic Counselor

## 2023-05-14 ENCOUNTER — Other Ambulatory Visit (HOSPITAL_COMMUNITY): Payer: Self-pay | Admitting: Hematology and Oncology

## 2023-05-14 DIAGNOSIS — N63 Unspecified lump in unspecified breast: Secondary | ICD-10-CM

## 2023-05-14 NOTE — Telephone Encounter (Signed)
 Patient stated she has to check with her case worker regarding her insurance she will call back

## 2023-05-18 ENCOUNTER — Inpatient Hospital Stay
Admission: RE | Admit: 2023-05-18 | Discharge: 2023-05-18 | Disposition: A | Discharge status: Home / Self Care | Payer: Self-pay | Source: Ambulatory Visit | Attending: Hematology and Oncology

## 2023-05-18 ENCOUNTER — Inpatient Hospital Stay
Admission: RE | Admit: 2023-05-18 | Discharge: 2023-05-18 | Disposition: A | Discharge status: Home / Self Care | Payer: Self-pay | Source: Ambulatory Visit | Attending: Hematology and Oncology | Admitting: Hematology and Oncology

## 2023-05-18 ENCOUNTER — Other Ambulatory Visit (HOSPITAL_COMMUNITY): Payer: Self-pay | Admitting: Hematology and Oncology

## 2023-05-18 DIAGNOSIS — N63 Unspecified lump in unspecified breast: Secondary | ICD-10-CM

## 2023-06-10 ENCOUNTER — Ambulatory Visit: Payer: Self-pay | Admitting: Internal Medicine

## 2023-06-16 ENCOUNTER — Ambulatory Visit (HOSPITAL_COMMUNITY)
Admission: RE | Admit: 2023-06-16 | Discharge: 2023-06-16 | Disposition: A | Discharge status: Home / Self Care | Payer: Self-pay | Source: Ambulatory Visit | Attending: Hematology and Oncology | Admitting: Hematology and Oncology

## 2023-06-16 ENCOUNTER — Other Ambulatory Visit (HOSPITAL_COMMUNITY): Payer: Self-pay | Admitting: Hematology and Oncology

## 2023-06-16 DIAGNOSIS — R928 Other abnormal and inconclusive findings on diagnostic imaging of breast: Secondary | ICD-10-CM

## 2023-06-16 DIAGNOSIS — N63 Unspecified lump in unspecified breast: Secondary | ICD-10-CM

## 2023-06-17 ENCOUNTER — Other Ambulatory Visit (HOSPITAL_COMMUNITY): Payer: Self-pay | Admitting: Hematology and Oncology

## 2023-06-17 DIAGNOSIS — R928 Other abnormal and inconclusive findings on diagnostic imaging of breast: Secondary | ICD-10-CM

## 2023-06-18 ENCOUNTER — Ambulatory Visit (HOSPITAL_COMMUNITY)
Admission: RE | Admit: 2023-06-18 | Discharge: 2023-06-18 | Disposition: A | Discharge status: Home / Self Care | Payer: Self-pay | Source: Ambulatory Visit | Attending: Hematology and Oncology | Admitting: Hematology and Oncology

## 2023-06-18 ENCOUNTER — Other Ambulatory Visit (HOSPITAL_COMMUNITY): Payer: Self-pay | Admitting: Hematology and Oncology

## 2023-06-18 ENCOUNTER — Encounter (HOSPITAL_COMMUNITY): Payer: Self-pay

## 2023-06-18 DIAGNOSIS — R928 Other abnormal and inconclusive findings on diagnostic imaging of breast: Secondary | ICD-10-CM

## 2023-06-18 DIAGNOSIS — C50012 Malignant neoplasm of nipple and areola, left female breast: Secondary | ICD-10-CM | POA: Diagnosis not present

## 2023-06-18 DIAGNOSIS — Z171 Estrogen receptor negative status [ER-]: Secondary | ICD-10-CM | POA: Diagnosis not present

## 2023-06-18 DIAGNOSIS — Z1721 Progesterone receptor positive status: Secondary | ICD-10-CM | POA: Diagnosis not present

## 2023-06-18 DIAGNOSIS — Z1732 Human epidermal growth factor receptor 2 negative status: Secondary | ICD-10-CM | POA: Diagnosis not present

## 2023-06-18 HISTORY — PX: BREAST BIOPSY: SHX20

## 2023-06-22 LAB — SURGICAL PATHOLOGY

## 2023-06-30 ENCOUNTER — Inpatient Hospital Stay: Payer: Self-pay | Attending: Hematology and Oncology | Admitting: Hematology and Oncology

## 2023-06-30 VITALS — BP 135/93 | HR 86 | Temp 98.7°F | Resp 18 | Ht 64.0 in | Wt 251.7 lb

## 2023-06-30 DIAGNOSIS — C50312 Malignant neoplasm of lower-inner quadrant of left female breast: Secondary | ICD-10-CM | POA: Diagnosis not present

## 2023-06-30 DIAGNOSIS — Z1501 Genetic susceptibility to malignant neoplasm of breast: Secondary | ICD-10-CM | POA: Insufficient documentation

## 2023-06-30 DIAGNOSIS — Z87891 Personal history of nicotine dependence: Secondary | ICD-10-CM | POA: Diagnosis not present

## 2023-06-30 DIAGNOSIS — Z170 Estrogen receptor positive status [ER+]: Secondary | ICD-10-CM | POA: Diagnosis not present

## 2023-06-30 DIAGNOSIS — C50012 Malignant neoplasm of nipple and areola, left female breast: Secondary | ICD-10-CM | POA: Diagnosis present
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
## 2023-06-30 NOTE — Assessment & Plan Note (Signed)
 Left lumpectomy 04/19/2014: IDC grade 2, 1.5 cm, intermediate grade DCIS, 0/2 lymph nodes, ER 90%, PR 90%, HER-2 negative ratio 1.33, Ki-67 20%,  Oncotype DX recurrence score 23, 15% ROR Right lumpectomy: Fibrocystic changes no malignancy. Completed adjuvant radiation 06/08/2014 to 07/28/2014 (Left breast   4500 cGy in 25 sessions, left breast boost 1800 cGy in 9 sessions), started tamoxifen  in June 2016    Relapse/recurrence: 06/18/2023:  Mammogram detected 1.7 cm mass left areola and 0.7 cm retroareolar mass and 1.2 cm retroareolar left breast mass: Biopsy: Grade 2 IDC, no LVI, ER 20%, PR 70%, Ki-67 1%, HER2 1+ negative  Treatment plan: CT chest abdomen pelvis and bone scan for staging Mastectomy Adjuvant antiestrogen therapy

## 2023-07-01 ENCOUNTER — Encounter: Payer: Self-pay | Admitting: *Deleted

## 2023-07-01 ENCOUNTER — Telehealth: Payer: Self-pay | Admitting: *Deleted

## 2023-07-01 NOTE — Telephone Encounter (Signed)
 Received call from pt requesting FMLA department fax number to send paperwork.  RN provided pt with number [phone].

## 2023-07-06 ENCOUNTER — Other Ambulatory Visit: Payer: Self-pay | Admitting: General Surgery

## 2023-07-06 DIAGNOSIS — Z1589 Genetic susceptibility to other disease: Secondary | ICD-10-CM | POA: Diagnosis not present

## 2023-07-06 DIAGNOSIS — C50112 Malignant neoplasm of central portion of left female breast: Secondary | ICD-10-CM | POA: Diagnosis not present

## 2023-07-06 DIAGNOSIS — Z419 Encounter for procedure for purposes other than remedying health state, unspecified: Secondary | ICD-10-CM | POA: Diagnosis not present

## 2023-07-06 DIAGNOSIS — Z170 Estrogen receptor positive status [ER+]: Secondary | ICD-10-CM | POA: Diagnosis not present

## 2023-07-06 DIAGNOSIS — C50912 Malignant neoplasm of unspecified site of left female breast: Secondary | ICD-10-CM | POA: Diagnosis not present

## 2023-07-07 ENCOUNTER — Other Ambulatory Visit: Payer: Self-pay | Admitting: General Surgery

## 2023-07-07 DIAGNOSIS — C50112 Malignant neoplasm of central portion of left female breast: Secondary | ICD-10-CM

## 2023-07-08 DIAGNOSIS — Z1589 Genetic susceptibility to other disease: Secondary | ICD-10-CM | POA: Diagnosis not present

## 2023-07-08 DIAGNOSIS — Z170 Estrogen receptor positive status [ER+]: Secondary | ICD-10-CM | POA: Diagnosis not present

## 2023-07-08 DIAGNOSIS — Z923 Personal history of irradiation: Secondary | ICD-10-CM | POA: Diagnosis not present

## 2023-07-08 DIAGNOSIS — C50112 Malignant neoplasm of central portion of left female breast: Secondary | ICD-10-CM | POA: Diagnosis not present

## 2023-07-09 ENCOUNTER — Ambulatory Visit (HOSPITAL_COMMUNITY): Admission: RE | Admit: 2023-07-09 | Source: Ambulatory Visit

## 2023-07-09 ENCOUNTER — Inpatient Hospital Stay: Admitting: Licensed Clinical Social Worker

## 2023-07-09 NOTE — Progress Notes (Signed)
 [facility] Clinical Social Work  Initial Assessment   Janice Crawford is a 48 y.o. year old female contacted by phone. Clinical Social Work was referred by new patient protocol for assessment of psychosocial needs.   SDOH (Social Determinants of Health) assessments performed: Yes SDOH Interventions    Flowsheet Row Clinical Support from 07/09/2023 in [facility] A Dept Of [place]  SDOH Interventions   Food Insecurity Interventions Intervention Not Indicated  Financial Strain Interventions Community Resources Provided, Other (Comment)  [cancer foundations]       SDOH Screenings   Food Insecurity: No Food Insecurity (07/09/2023)  Housing: High Risk (07/09/2023)  Financial Resource Strain: High Risk (07/09/2023)  Tobacco Use: Low Risk  (07/08/2023)   Received from [facility]  Recent Concern: Tobacco Use - Medium Risk (06/18/2023)     Distress Screen completed: No   Family/Social Information:  Housing Arrangement: patient lives with her 2 yo granddaughter and daughter currently. She is trying to move back to [place] but needs her kids to also sign the information for the apartment applications since they are adult age Family members/support persons in your life? Family Transportation concerns: no  Employment: Working for [employer]. 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 She understands her options of single vs double  mastectomy and then possible reconstruction. She is having a difficult time deciding as she is thinking about what will feel best for her with body image/ confidence and potential future romantic relationships and weighing that with desire to be around for a long time to see her grandchildren grow up. 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, Actuary, Adjusting to my illness, and Facing my mortality Hopes and/or priorities: to be alive and well for a long time to see her grandhicld grow into an adult Patient enjoys time with family/ friends Current coping skills/ strengths: Motivation for treatment/growth , Barista , and Other: knowledge of community resources    SUMMARY: Current SDOH Barriers:  Financial constraints related to decrease in work due to appointments and treatment  Clinical Social Work Clinical Goal(s):  Explore community resource options for unmet needs related to:  Financial Strain   Interventions: Discussed common feeling and emotions when being diagnosed with cancer, and the importance of support during treatment Informed patient of the support team roles and support services at [facility] Provided CSW contact information and encouraged patient to call with any questions or concerns Referred patient to [employer] guide/ peer mentor program Provided information about cancer foundations and local resources for financial support    Follow Up Plan: Patient will contact CSW with any support or resource needs and Patient will apply to [employer] foundation Patient verbalizes understanding of plan: Yes    Haroon Shatto E Shambhavi Salley, LCSW Clinical Social Worker [facility]  Patient is participating in a Managed  Medicaid Plan:  Yes

## 2023-07-10 ENCOUNTER — Encounter (HOSPITAL_COMMUNITY)

## 2023-07-10 ENCOUNTER — Encounter (HOSPITAL_COMMUNITY): Admission: RE | Admit: 2023-07-10 | Source: Ambulatory Visit

## 2023-07-14 ENCOUNTER — Ambulatory Visit (HOSPITAL_COMMUNITY)

## 2023-07-16 ENCOUNTER — Telehealth: Payer: Self-pay

## 2023-07-16 NOTE — Telephone Encounter (Signed)
 Notified Patient of completion of FMLA forms. Fax transmission confirmation received. Copy of forms placed for pick-up as requested. No other needs or concerns noted at this time.

## 2023-07-22 ENCOUNTER — Ambulatory Visit (HOSPITAL_COMMUNITY)

## 2023-07-23 ENCOUNTER — Encounter: Payer: Self-pay | Admitting: *Deleted

## 2023-07-24 ENCOUNTER — Ambulatory Visit
Admission: RE | Admit: 2023-07-24 | Discharge: 2023-07-24 | Disposition: A | Discharge status: Home / Self Care | Source: Ambulatory Visit | Attending: General Surgery | Admitting: General Surgery

## 2023-07-24 ENCOUNTER — Ambulatory Visit (HOSPITAL_COMMUNITY)
Admission: RE | Admit: 2023-07-24 | Discharge: 2023-07-24 | Disposition: A | Discharge status: Home / Self Care | Source: Ambulatory Visit | Attending: Hematology and Oncology | Admitting: Hematology and Oncology

## 2023-07-24 DIAGNOSIS — K820 Obstruction of gallbladder: Secondary | ICD-10-CM | POA: Diagnosis not present

## 2023-07-24 DIAGNOSIS — R918 Other nonspecific abnormal finding of lung field: Secondary | ICD-10-CM | POA: Diagnosis not present

## 2023-07-24 DIAGNOSIS — Z170 Estrogen receptor positive status [ER+]: Secondary | ICD-10-CM | POA: Diagnosis not present

## 2023-07-24 DIAGNOSIS — C50312 Malignant neoplasm of lower-inner quadrant of left female breast: Secondary | ICD-10-CM | POA: Insufficient documentation

## 2023-07-24 DIAGNOSIS — C50112 Malignant neoplasm of central portion of left female breast: Secondary | ICD-10-CM | POA: Diagnosis not present

## 2023-07-24 DIAGNOSIS — N6314 Unspecified lump in the right breast, lower inner quadrant: Secondary | ICD-10-CM | POA: Diagnosis not present

## 2023-07-24 DIAGNOSIS — K439 Ventral hernia without obstruction or gangrene: Secondary | ICD-10-CM | POA: Diagnosis not present

## 2023-07-24 MED ORDER — GADOPICLENOL 0.5 MMOL/ML IV SOLN
10.0000 mL | Freq: Once | INTRAVENOUS | Status: AC | PRN
  Administered 2023-07-24: 10 mL via INTRAVENOUS

## 2023-07-24 MED ORDER — IOHEXOL 300 MG/ML  SOLN
100.0000 mL | Freq: Once | INTRAMUSCULAR | Status: AC | PRN
  Administered 2023-07-24: 100 mL via INTRAVENOUS

## 2023-07-27 ENCOUNTER — Encounter: Payer: Self-pay | Admitting: *Deleted

## 2023-07-27 ENCOUNTER — Ambulatory Visit (HOSPITAL_COMMUNITY)
Admission: RE | Admit: 2023-07-27 | Discharge: 2023-07-27 | Disposition: A | Discharge status: Home / Self Care | Source: Ambulatory Visit | Attending: Hematology and Oncology | Admitting: Hematology and Oncology

## 2023-07-27 DIAGNOSIS — C50919 Malignant neoplasm of unspecified site of unspecified female breast: Secondary | ICD-10-CM | POA: Diagnosis not present

## 2023-07-27 DIAGNOSIS — C50312 Malignant neoplasm of lower-inner quadrant of left female breast: Secondary | ICD-10-CM | POA: Insufficient documentation

## 2023-07-27 DIAGNOSIS — Z170 Estrogen receptor positive status [ER+]: Secondary | ICD-10-CM | POA: Insufficient documentation

## 2023-07-27 MED ORDER — TECHNETIUM TC 99M MEDRONATE IV KIT
20.0000 | PACK | Freq: Once | INTRAVENOUS | Status: AC | PRN
Start: 2023-07-27 — End: 2023-07-27
  Administered 2023-07-27: 20 via INTRAVENOUS

## 2023-07-28 ENCOUNTER — Other Ambulatory Visit: Payer: Self-pay | Admitting: General Surgery

## 2023-07-28 DIAGNOSIS — R9389 Abnormal findings on diagnostic imaging of other specified body structures: Secondary | ICD-10-CM

## 2023-07-29 ENCOUNTER — Inpatient Hospital Stay: Attending: Hematology and Oncology | Admitting: Hematology and Oncology

## 2023-07-29 NOTE — Assessment & Plan Note (Deleted)
 Left lumpectomy 04/19/2014: IDC grade 2, 1.5 cm, intermediate grade DCIS, 0/2 lymph nodes, ER 90%, PR 90%, HER-2 negative ratio 1.33, Ki-67 20%,  Oncotype DX recurrence score 23, 15% ROR Right lumpectomy: Fibrocystic changes no malignancy. Completed adjuvant radiation 06/08/2014 to 07/28/2014 (Left breast   4500 cGy in 25 sessions, left breast boost 1800 cGy in 9 sessions), started tamoxifen  in June 2016     Relapse/recurrence: 06/18/2023:  Mammogram detected 1.7 cm mass left areola and 0.7 cm retroareolar mass and 1.2 cm retroareolar left breast mass: Biopsy: Grade 2 IDC, no LVI, ER 20%, PR 70%, Ki-67 1%, HER2 1+ negative  07/24/2023: Breast MRI: 1.9 cm left retroareolar, 7 mm biopsy-proven mass at UIQ, 7 mm right LIQ mass and 1 cm right LIQ masses (needs biopsy) 07/24/2023: CT CAP and bone scan: No evidence of metastatic disease  Treatment plan: Biopsy of the additional masses in the right breast Mastectomy (unilateral versus bilateral will be discussed with Dr. Cherlynn Cornfield.  She wants reconstruction as well) Adjuvant antiestrogen therapy  Return to clinic after surgery

## 2023-07-30 ENCOUNTER — Encounter: Payer: Self-pay | Admitting: *Deleted

## 2023-08-06 DIAGNOSIS — Z419 Encounter for procedure for purposes other than remedying health state, unspecified: Secondary | ICD-10-CM | POA: Diagnosis not present

## 2023-08-10 ENCOUNTER — Inpatient Hospital Stay: Admission: RE | Admit: 2023-08-10 | Source: Ambulatory Visit

## 2023-08-10 ENCOUNTER — Other Ambulatory Visit

## 2023-08-10 ENCOUNTER — Encounter

## 2023-08-19 ENCOUNTER — Encounter: Payer: Self-pay | Admitting: *Deleted

## 2023-08-26 ENCOUNTER — Ambulatory Visit
Admission: RE | Admit: 2023-08-26 | Discharge: 2023-08-26 | Disposition: A | Discharge status: Home / Self Care | Source: Ambulatory Visit | Attending: General Surgery | Admitting: General Surgery

## 2023-08-26 ENCOUNTER — Other Ambulatory Visit: Payer: Self-pay | Admitting: General Surgery

## 2023-08-26 DIAGNOSIS — R9389 Abnormal findings on diagnostic imaging of other specified body structures: Secondary | ICD-10-CM

## 2023-08-26 DIAGNOSIS — R928 Other abnormal and inconclusive findings on diagnostic imaging of breast: Secondary | ICD-10-CM

## 2023-08-26 MED ORDER — GADOPICLENOL 0.5 MMOL/ML IV SOLN
10.0000 mL | Freq: Once | INTRAVENOUS | Status: AC | PRN
  Administered 2023-08-26: 10 mL via INTRAVENOUS

## 2023-08-27 ENCOUNTER — Encounter: Payer: Self-pay | Admitting: *Deleted

## 2023-08-27 LAB — SURGICAL PATHOLOGY

## 2023-08-28 ENCOUNTER — Telehealth

## 2023-09-02 ENCOUNTER — Encounter: Payer: Self-pay | Admitting: *Deleted

## 2023-09-04 DIAGNOSIS — Z1589 Genetic susceptibility to other disease: Secondary | ICD-10-CM | POA: Diagnosis not present

## 2023-09-04 DIAGNOSIS — Z170 Estrogen receptor positive status [ER+]: Secondary | ICD-10-CM | POA: Diagnosis not present

## 2023-09-04 DIAGNOSIS — Z923 Personal history of irradiation: Secondary | ICD-10-CM | POA: Diagnosis not present

## 2023-09-04 DIAGNOSIS — C50112 Malignant neoplasm of central portion of left female breast: Secondary | ICD-10-CM | POA: Diagnosis not present

## 2023-09-05 DIAGNOSIS — Z419 Encounter for procedure for purposes other than remedying health state, unspecified: Secondary | ICD-10-CM | POA: Diagnosis not present

## 2023-09-07 ENCOUNTER — Encounter: Payer: Self-pay | Admitting: Hematology and Oncology

## 2023-09-07 NOTE — Progress Notes (Signed)
 Surgical Instructions   Your procedure is scheduled on Thursday July 17. Report to [facility] Main Entrance A at 9:30 A.M., then check in with the Admitting office. Any questions or running late day of surgery: call [phone]  Questions prior to your surgery date: call [phone], Monday-Friday, 8am-4pm. If you experience any cold or flu symptoms such as cough, fever, chills, shortness of breath, etc. between now and your scheduled surgery, please notify us  at the above number.     Remember:  Do not eat after midnight the night before your surgery   You may drink clear liquids until 8:30am the morning of your surgery.   Clear liquids allowed are: Water, Non-Citrus Juices (without pulp), Carbonated Beverages, Clear Tea (no milk, honey, etc.), Black Coffee Only (NO MILK, CREAM OR POWDERED CREAMER of any kind), and Gatorade.    Take these medicines the morning of surgery with A SIP OF WATER: none   May take these medicines IF NEEDED:none    One week prior to surgery, STOP taking any Aspirin (unless otherwise instructed by your surgeon) Aleve , Naproxen , Ibuprofen , Motrin , Advil , Goody's, BC's, all herbal medications, fish oil, and non-prescription vitamins.                     Do NOT Smoke (Tobacco/Vaping) for 24 hours prior to your procedure.  If you use a CPAP at night, you may bring your mask/headgear for your overnight stay.   You will be asked to remove any contacts, glasses, piercing's, hearing aid's, dentures/partials prior to surgery. Please bring cases for these items if needed.    Patients discharged the day of surgery will not be allowed to drive home, and someone needs to stay with them for 24 hours.  SURGICAL WAITING ROOM VISITATION Patients may have no more than 2 support people in the waiting area - these visitors may rotate.   Pre-op nurse will coordinate an appropriate time for 1 ADULT support person, who may not rotate, to accompany patient in pre-op.   Children under the age of 16 must have an adult with them who is not the patient and must remain in the main waiting area with an adult.  If the patient needs to stay at the hospital during part of their recovery, the visitor guidelines for inpatient rooms apply.  Please refer to the [facility] website for the visitor guidelines for any additional information.   If you received a COVID test during your pre-op visit  it is requested that you wear a mask when out in public, stay away from anyone that may not be feeling well and notify your surgeon if you develop symptoms. If you have been in contact with anyone that has tested positive in the last 10 days please notify you surgeon.      Pre-operative CHG Bathing Instructions   You can play a key role in reducing the risk of infection after surgery. Your skin needs to be as free of germs as possible. You can reduce the number of germs on your skin by washing with CHG (chlorhexidine gluconate) soap before surgery. CHG is an antiseptic soap that kills germs and continues to kill germs even after washing.   DO NOT use if you have an allergy to chlorhexidine/CHG or antibacterial soaps. If your skin becomes reddened or irritated, stop using the CHG and notify one of our RNs at [phone].              TAKE A SHOWER THE  NIGHT BEFORE SURGERY AND THE DAY OF SURGERY    Please keep in mind the following:  DO NOT shave, including legs and underarms, 48 hours prior to surgery.   You may shave your face before/day of surgery.  Place clean sheets on your bed the night before surgery Use a clean washcloth (not used since being washed) for each shower. DO NOT sleep with pet's night before surgery.  CHG Shower Instructions:  Wash your face and private area with normal soap. If you choose to wash your hair, wash first with your normal shampoo.  After you use shampoo/soap, rinse your hair and body thoroughly to remove shampoo/soap residue.  Turn the  water OFF and apply half the bottle of CHG soap to a CLEAN washcloth.  Apply CHG soap ONLY FROM YOUR NECK DOWN TO YOUR TOES (washing for 3-5 minutes)  DO NOT use CHG soap on face, private areas, open wounds, or sores.  Pay special attention to the area where your surgery is being performed.  If you are having back surgery, having someone wash your back for you may be helpful. Wait 2 minutes after CHG soap is applied, then you may rinse off the CHG soap.  Pat dry with a clean towel  Put on clean pajamas    Additional instructions for the day of surgery: DO NOT APPLY any lotions, deodorants, cologne, or perfumes.   Do not wear jewelry or makeup Do not wear nail polish, gel polish, artificial nails, or any other type of covering on natural nails (fingers and toes) Do not bring valuables to the hospital. [facility] is not responsible for valuables/personal belongings. Put on clean/comfortable clothes.  Please brush your teeth.  Ask your nurse before applying any prescription medications to the skin.

## 2023-09-08 ENCOUNTER — Other Ambulatory Visit: Payer: Self-pay

## 2023-09-08 ENCOUNTER — Telehealth: Payer: Self-pay | Admitting: *Deleted

## 2023-09-08 ENCOUNTER — Encounter (HOSPITAL_COMMUNITY): Payer: Self-pay

## 2023-09-08 ENCOUNTER — Inpatient Hospital Stay (HOSPITAL_COMMUNITY)
Admission: RE | Admit: 2023-09-08 | Discharge: 2023-09-08 | Disposition: A | Discharge status: Home / Self Care | Source: Ambulatory Visit

## 2023-09-08 NOTE — Progress Notes (Signed)
 PCP - No PCP Cardiologist -  Hematology and Oncology -Odean Mackey POUR, MD   PPM/ICD - denies Device Orders - n/a Rep Notified - n/a  Chest x-ray - denies EKG - denies Stress Test - denies ECHO - 01-23-15 Cardiac Cath - denies  Sleep Study - denies CPAP -   Dm -denies  Blood Thinner Instructions: denies Aspirin Instructions:n/a  ERAS Protcol - clear liquids until 8:30  COVID TEST- n/a   Anesthesia review: no. Patient forgot about PAT appointment so did a SDW call today  Patient denies shortness of breath, fever, cough and chest pain at PAT appointment   All instructions explained to the patient, with a verbal understanding of the material. Patient agrees to go over the instructions while at home for a better understanding. Patient also instructed to self quarantine after being tested for COVID-19. The opportunity to ask questions was provided.

## 2023-09-08 NOTE — Telephone Encounter (Signed)
 Received voicemail from [place], [phone] (home) ).  Trying to return reach [place] who responded to my MyChart.  Please call me.  Unable to connect patient.  Will send a MyChart message.

## 2023-09-08 NOTE — Progress Notes (Signed)
 Surgical Instructions     Your procedure is scheduled on Thursday July 17. Report to [facility] Main Entrance A at 9:00A.M., then check in with the Admitting office. Any questions or running late day of surgery: call [phone]   Questions prior to your surgery date: call [phone], Monday-Friday, 8am-4pm. If you experience any cold or flu symptoms such as cough, fever, chills, shortness of breath, etc. between now and your scheduled surgery, please notify us  at the above number.            Remember:       Do not eat after midnight the night before your surgery     You may drink clear liquids until 8:30am the morning of your surgery.   Clear liquids allowed are: Water, Non-Citrus Juices (without pulp), Carbonated Beverages, Clear Tea (no milk, honey, etc.), Black Coffee Only (NO MILK, CREAM OR POWDERED CREAMER of any kind), and Gatorade.          Take these medicines the morning of surgery with A SIP OF WATER: none     May take these medicines IF NEEDED:none       One week prior to surgery, STOP taking any Aspirin (unless otherwise instructed by your surgeon) Aleve , Naproxen , Ibuprofen , Motrin , Advil , Goody's, BC's, all herbal medications, fish oil, and non-prescription vitamins.                     Do NOT Smoke (Tobacco/Vaping) for 24 hours prior to your procedure.   If you use a CPAP at night, you may bring your mask/headgear for your overnight stay.   You will be asked to remove any contacts, glasses, piercing's, hearing aid's, dentures/partials prior to surgery. Please bring cases for these items if needed.    Patients discharged the day of surgery will not be allowed to drive home, and someone needs to stay with them for 24 hours.   SURGICAL WAITING ROOM VISITATION Patients may have no more than 2 support people in the waiting area - these visitors may rotate.   Pre-op nurse will coordinate an appropriate time for 1 ADULT support person, who may not rotate, to  accompany patient in pre-op.  Children under the age of 20 must have an adult with them who is not the patient and must remain in the main waiting area with an adult.   If the patient needs to stay at the hospital during part of their recovery, the visitor guidelines for inpatient rooms apply.   Please refer to the [facility] website for the visitor guidelines for any additional information.     If you received a COVID test during your pre-op visit  it is requested that you wear a mask when out in public, stay away from anyone that may not be feeling well and notify your surgeon if you develop symptoms. If you have been in contact with anyone that has tested positive in the last 10 days please notify you surgeon.         Pre-operative CHG Bathing Instructions    You can play a key role in reducing the risk of infection after surgery. Your skin needs to be as free of germs as possible. You can reduce the number of germs on your skin by washing with CHG (chlorhexidine gluconate) soap before surgery. CHG is an antiseptic soap that kills germs and continues to kill germs even after washing.    DO NOT use if you have an allergy to chlorhexidine/CHG or  antibacterial soaps. If your skin becomes reddened or irritated, stop using the CHG and notify one of our RNs at [phone].               TAKE A SHOWER THE NIGHT BEFORE SURGERY AND THE DAY OF SURGERY     Please keep in mind the following:  DO NOT shave, including legs and underarms, 48 hours prior to surgery.   You may shave your face before/day of surgery.  Place clean sheets on your bed the night before surgery Use a clean washcloth (not used since being washed) for each shower. DO NOT sleep with pet's night before surgery.   CHG Shower Instructions:  Wash your face and private area with normal soap. If you choose to wash your hair, wash first with your normal shampoo.  After you use shampoo/soap, rinse your hair and body thoroughly to  remove shampoo/soap residue.  Turn the water OFF and apply half the bottle of CHG soap to a CLEAN washcloth.  Apply CHG soap ONLY FROM YOUR NECK DOWN TO YOUR TOES (washing for 3-5 minutes)  DO NOT use CHG soap on face, private areas, open wounds, or sores.  Pay special attention to the area where your surgery is being performed.  If you are having back surgery, having someone wash your back for you may be helpful. Wait 2 minutes after CHG soap is applied, then you may rinse off the CHG soap.  Pat dry with a clean towel  Put on clean pajamas     Additional instructions for the day of surgery: DO NOT APPLY any lotions, deodorants, cologne, or perfumes.   Do not wear jewelry or makeup Do not wear nail polish, gel polish, artificial nails, or any other type of covering on natural nails (fingers and toes) Do not bring valuables to the hospital. [facility] is not responsible for valuables/personal belongings. Put on clean/comfortable clothes.  Please brush your teeth.  Ask your nurse before applying any prescription medications to the skin.

## 2023-09-09 NOTE — Telephone Encounter (Signed)
 Called [place], [phone].  Message left regarding record request for [facility].  May request through Patient Portal MyChart or sign authorization for this office to fax with a signed HIPAA Authorization.  Direct extension provided to call for any questions,,

## 2023-09-10 ENCOUNTER — Encounter: Payer: Self-pay | Admitting: *Deleted

## 2023-09-17 ENCOUNTER — Other Ambulatory Visit: Payer: Self-pay

## 2023-09-17 ENCOUNTER — Encounter (HOSPITAL_BASED_OUTPATIENT_CLINIC_OR_DEPARTMENT_OTHER): Payer: Self-pay | Admitting: General Surgery

## 2023-09-23 MED ORDER — CHLORHEXIDINE GLUCONATE CLOTH 2 % EX PADS: 6.0000 | MEDICATED_PAD | Freq: Once | CUTANEOUS | Status: DC

## 2023-09-23 NOTE — Progress Notes (Signed)

## 2023-09-23 NOTE — Anesthesia Preprocedure Evaluation (Addendum)
 Anesthesia Evaluation  Patient identified by MRN, date of birth, ID band Patient awake    Reviewed: Allergy & Precautions, NPO status , Patient's Chart, lab work & pertinent test results  History of Anesthesia Complications Negative for: history of anesthetic complications  Airway Mallampati: II  TM Distance: >3 FB Neck ROM: Full    Dental no notable dental hx. (+) Teeth Intact, Dental Advisory Given   Pulmonary former smoker, PE (hx 10 years ago)   Pulmonary exam normal breath sounds clear to auscultation       Cardiovascular Exercise Tolerance: Good (-) hypertension(-) angina (-) Past MI Normal cardiovascular exam Rhythm:Regular Rate:Normal     Neuro/Psych  Headaches  negative psych ROS   GI/Hepatic ,GERD  ,,  Endo/Other    Class 3 obesity  Renal/GU      Musculoskeletal  (+) Arthritis ,    Abdominal   Peds  Hematology   Anesthesia Other Findings All: latex  Reproductive/Obstetrics                              Anesthesia Physical Anesthesia Plan  ASA: 3  Anesthesia Plan: General and Regional   Post-op Pain Management: Regional block*, Minimal or no pain anticipated, Tylenol  PO (pre-op)* and Precedex   Induction: Intravenous  PONV Risk Score and Plan: 4 or greater and Treatment may vary due to age or medical condition, Midazolam , Propofol  infusion, TIVA and Ondansetron   Airway Management Planned: LMA  Additional Equipment: None  Intra-op Plan:   Post-operative Plan: Extubation in OR  Informed Consent:      Dental advisory given  Plan Discussed with:   Anesthesia Plan Comments: (TIVA LMA L PEC block bilat)         Anesthesia Quick Evaluation

## 2023-09-23 NOTE — H&P (Signed)
 REFERRING PHYSICIAN: Gudena  PROVIDER: JINA CLAIR NEPHEW, MD  Care Team: Patient Care Team: Therisa Arabia as PCP - General (Physician Assistant) NEPHEW JINA CLAIR, MD as Consulting Provider (Surgical Oncology) Gudena, Vinay K, MD (Hematology and Oncology) Jacqulin Nat BRAVO, NP (Obstetrics and Gynecology) Skeet Juliene Charleston, DO (Neurology)   MRN: [id number] DOB: Oct 29, 1975  Subjective   Chief Complaint: L breast cancer   History of Present Illness: Janice Crawford is a 48 y.o. female who is seen today as an office consultation at the request of Dr. Gudena for evaluation of L breast cancer .  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 Patient presents with a palpable mass and new diagnosis of left breast cancer April 2025. The patient underwent diagnostic imaging which showed masslike thickening in the immediate retroareolar left breast which is approximately 1.7 cm with an adjacent mass measuring 7 mm. There were normal lymph nodes in the axilla. Core needle biopsies were performed of both of these areas and these were both positive for grade 2 invasive ductal carcinoma. Prognostic panel was positive for estrogen weak to moderate positive for progesterone, negative for HER2 and Ki-67 was 1%. Right breast showed cystic disease, but no concerning lesions.  Of note, the patient is well-known to me from breast cancer a decade ago. Patient had a left-sided breast cancer lower inner quadrant that was stage Ia with a T1c N0 breast cancer that was grade 2 ER and PR positive, HER2 negative, Ki-67 20%. This was treated with lumpectomy, sentinel lymph node biopsy and radiation. Margins and 2 sentinel nodes were negative. Oncotype score was 23. 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 Patient declined mastectomies at the time.   Patient had PE and  DVT with tamoxifen  during her treatment a decade ago.  Family cancer history -mother had breast cancer age 49, father had prostate cancer, brother had some type of cancer at age 60. Menarche was age 59  Diagnostic mammogram/us : 06/16/23 [facility]  ACR Breast Density Category c: The breasts are heterogeneously dense, which may obscure small masses.  FINDINGS: Multiple oval and round circumscribed masses are again identified in the bilateral breast compatible with fluctuating cysts, with a large mass in the upper inner right breast measuring 2.5 cm. Lumpectomy changes are present in the inner posterior left breast. Spot compression tomosynthesis was performed of the central/retroareolar left breast demonstrating masslike thickening involving the left nipple areolar complex subjacent small oval masses in the retroareolar left breast.  Physical examination reveals firm masslike thickening involving the medial aspect of the left areola.  Targeted ultrasound of the right breast was performed demonstrating innumerable areas of fibrocystic change. The largest cyst in the right breast at 1 o'clock 5 cm from nipple measures 2.4 x 0.9 x 2.7 cm. This corresponds well with the mass seen in the upper inner right breast at mammography. No suspicious masses or any other worrisome abnormality seen in the right breast sonographically.  Targeted ultrasound of the central/retroareolar left breast was performed. There is masslike thickening involving the immediate retroareolar left breast only well appreciated in 1 plane however measures approximately 1.7 x 0.7 by 1.3 cm. There is an adjacent hypoechoic mass also in the retroareolar left breast measuring 0.7 x 0.3 x 0.6 cm. There is a possible additional mass in the retroareolar left breast versus a prominent fat lobule measuring 1.2 x  0.4 x 1.1 cm. Normal lymph nodes are present in the left axilla.  IMPRESSION: 1.7 cm mass involving the medial left  areola as well as a 0.7 cm retroareolar left breast mass and possible 1.2 cm retroareolar left breast mass (versus a normal fat lobule).  RECOMMENDATION: 1. Recommend attempt at ultrasound-guided core biopsy of the 1.7 cm mass involving the medial left areola.  2. Recommend additional ultrasound-guided core biopsy of the 0.7 cm mass in the retroareolar left breast.  I have discussed the findings and recommendations with the patient. If applicable, a reminder letter will be sent to the patient regarding the next appointment.  BI-RADS CATEGORY 4: Suspicious.  Pathology core needle biopsy: 06/18/23 A. LEFT BREAST, 9 OC RETRO, NEEDLE CORE BIOPSY: # - Invasive ductal carcinoma, see comment - Tubule formation: Score 3 - Nuclear pleomorphism: Score 2 - Mitotic count: Score 1 - Total score: 6 - Overall Grade: 2 - Lymphovascular invasion: Not identified - Cancer Length: 0.2 cm - Calcifications: Not identified - Other findings: None  B. LEFT BREAST, RETRO, NEEDLE CORE BIOPSY: - Invasive ductal carcinoma, see comment - Tubule formation: Score 3 - Nuclear pleomorphism: Score 2 - Mitotic count: Score 1 - Total score: 6 - Overall Grade: 2 - Lymphovascular invasion: Not identified - Cancer Length: 0.7 cm - Calcifications: Not identified - Other findings: None   Receptors: The tumor cells are negative for Her2 (1+).  Estrogen Receptor: 20%, positive, weak to moderate  Progesterone Receptor: 70%, moderate to strong  Proliferation Marker Ki67: 1%   Review of Systems: A complete review of systems was obtained from the patient. I have reviewed this information and discussed as appropriate with the patient. See HPI as well for other ROS. ROS -positive for visual issues and depression  Medical History: Past Medical History:  Diagnosis Date  DVT (deep venous thrombosis) (CMS/HHS-HCC)  History of cancer   Patient Active Problem List  Diagnosis  Malignant neoplasm of central portion  of left female breast (CMS/HHS-HCC)  Recurrent breast cancer, left (CMS/HHS-HCC)  PALB2 positive   Past Surgical History:  Procedure Laterality Date  BREAST LUMPECTOMY WITH NEEDLE LOCALIZATION AND AXILLARY SENTINEL LYMPH NODE BX (Left: Breast) RIGHT EXCIAIONAL BREAST BIOPSY WITH NEEDLE LOCALIZATION 04/19/2014  Dr. Aron    Allergies  Allergen Reactions  Latex Hives   No current outpatient medications on file prior to visit.   No current facility-administered medications on file prior to visit.   Family History  Problem Relation Age of Onset  Breast cancer Mother    Social History   Tobacco Use  Smoking Status Never  Smokeless Tobacco Never    Social History   Socioeconomic History  Marital status: Single  Tobacco Use  Smoking status: Never  Smokeless tobacco: Never  Substance and Sexual Activity  Alcohol use: Yes  Comment: 1 glass margarita  Drug use: Never   Objective:   Vitals:   BP: (!) 142/82  Pulse: 91  Temp: 36.7 C (98.1 F)  SpO2: 99%  Weight: (!) 115.3 kg (254 lb 3.2 oz)  Height: 162.6 cm (5' 4)  PainSc: 0-No pain   Body mass index is 43.63 kg/m.  Gen: No acute distress. Well nourished and well groomed.  Neurological: Alert and oriented to person, place, and time. Coordination normal.  Head: Normocephalic and atraumatic.  Eyes: Conjunctivae are normal. Pupils are equal, round, and reactive to light. No scleral icterus.  Neck: Normal range of motion. Neck supple. No tracheal deviation or thyromegaly present.  Cardiovascular:  Normal rate, regular rhythm, normal heart sounds and intact distal pulses. Exam reveals no gallop and no friction rub. No murmur heard. Breast: Left breast more than right. Palpable mass at 9:00 on the left nipple. This is fixed to the skin, but not protruding through the skin. Slightly tender. No lymphadenopathy appreciated. No appreciable lymphadenopathy. No nipple retraction. Right breast benign. Respiratory: Effort  normal. No respiratory distress. No chest wall tenderness. Breath sounds normal. No wheezes, rales or rhonchi.  GI: Soft. Bowel sounds are normal. The abdomen is soft and nontender. There is no rebound and no guarding.  Musculoskeletal: Normal range of motion. Extremities are nontender.  Lymphadenopathy: No cervical, preauricular, postauricular or axillary adenopathy is present Skin: Skin is warm and dry. No rash noted. No diaphoresis. No erythema. No pallor. No clubbing, cyanosis, or edema.  Psychiatric: Normal mood and affect. Behavior is normal. Judgment and thought content normal.   Labs N/a  Assessment and Plan:   ICD-10-CM  1. Malignant neoplasm of central portion of left breast in female, estrogen receptor positive (CMS/HHS-HCC) C50.112 Ambulatory Referral to Plastic Surgery  Z17.0 MRI breast bilateral with and without contrast  Ambulatory Referral to Physical Therapy   2. Recurrent breast cancer, left (CMS/HHS-HCC) C50.912 Ambulatory Referral to Plastic Surgery  MRI breast bilateral with and without contrast  Ambulatory Referral to Physical Therapy   3. PALB2 positive Z15.89 Ambulatory Referral to Plastic Surgery  MRI breast bilateral with and without contrast    Assessment & Plan Breast cancer, left side Recurrence of left breast cancer post-radiation. Mastectomy required due to prior radiation Will have to excise nipple due to location of recurrence at the nipple. Lymph nodes appear normal on imaging. - Schedule left mastectomy with repeat sentinel lymph node biopsy. - Refer to plastic surgery for reconstruction consultation with Dr. Arelia. - Order breast MRI to evaluate the right breast. - Perform staging scans including CT and bone scan. These are already set up for this week - Discuss the surgical procedure, including the use of surgical drains and the expected recovery process. Risks of surgery reviewed including bleeding, infection, dissatisfaction with  appearance, numbness, possible need for additional surgeries or procedures, possible cancer recurrence, possible blood clot, possible heart or lung issues.  Breast cancer, right side No current evidence of cancer in the right breast. High-risk genetic defect present. Discussed potential advantage of bilateral mastectomies. Patient is not ready for this. Also discussed potential for reduction of the right side as I am not sure that the left side can be reconstructed to be as large as the right side - Order annual breast MRI for surveillance due to high-risk genetic defect.  Genetic susceptibility to malignant neoplasm High-risk genetic defect increases breast cancer susceptibility. Bilateral mastectomy considered but deferred pending MRI results and patient preference.

## 2023-09-24 ENCOUNTER — Encounter (HOSPITAL_BASED_OUTPATIENT_CLINIC_OR_DEPARTMENT_OTHER): Payer: Self-pay | Admitting: General Surgery

## 2023-09-24 ENCOUNTER — Ambulatory Visit (HOSPITAL_BASED_OUTPATIENT_CLINIC_OR_DEPARTMENT_OTHER): Payer: Self-pay | Admitting: Anesthesiology

## 2023-09-24 ENCOUNTER — Encounter (HOSPITAL_BASED_OUTPATIENT_CLINIC_OR_DEPARTMENT_OTHER)
Admission: RE | Disposition: A | Discharge status: Home / Self Care | Payer: Self-pay | Source: Home / Self Care | Attending: General Surgery

## 2023-09-24 ENCOUNTER — Other Ambulatory Visit: Payer: Self-pay

## 2023-09-24 ENCOUNTER — Ambulatory Visit (HOSPITAL_BASED_OUTPATIENT_CLINIC_OR_DEPARTMENT_OTHER)
Admission: RE | Admit: 2023-09-24 | Discharge: 2023-09-25 | Disposition: A | Discharge status: Home / Self Care | Attending: General Surgery | Admitting: General Surgery

## 2023-09-24 DIAGNOSIS — D242 Benign neoplasm of left breast: Secondary | ICD-10-CM | POA: Diagnosis not present

## 2023-09-24 DIAGNOSIS — K219 Gastro-esophageal reflux disease without esophagitis: Secondary | ICD-10-CM | POA: Diagnosis not present

## 2023-09-24 DIAGNOSIS — E66813 Obesity, class 3: Secondary | ICD-10-CM | POA: Insufficient documentation

## 2023-09-24 DIAGNOSIS — D241 Benign neoplasm of right breast: Secondary | ICD-10-CM | POA: Diagnosis not present

## 2023-09-24 DIAGNOSIS — Z6841 Body Mass Index (BMI) 40.0 and over, adult: Secondary | ICD-10-CM | POA: Diagnosis not present

## 2023-09-24 DIAGNOSIS — Z87891 Personal history of nicotine dependence: Secondary | ICD-10-CM | POA: Insufficient documentation

## 2023-09-24 DIAGNOSIS — R7303 Prediabetes: Secondary | ICD-10-CM | POA: Diagnosis not present

## 2023-09-24 DIAGNOSIS — Z170 Estrogen receptor positive status [ER+]: Secondary | ICD-10-CM | POA: Diagnosis not present

## 2023-09-24 DIAGNOSIS — Z1732 Human epidermal growth factor receptor 2 negative status: Secondary | ICD-10-CM | POA: Diagnosis not present

## 2023-09-24 DIAGNOSIS — N6081 Other benign mammary dysplasias of right breast: Secondary | ICD-10-CM | POA: Diagnosis not present

## 2023-09-24 DIAGNOSIS — Z86711 Personal history of pulmonary embolism: Secondary | ICD-10-CM | POA: Diagnosis not present

## 2023-09-24 DIAGNOSIS — Z1721 Progesterone receptor positive status: Secondary | ICD-10-CM | POA: Insufficient documentation

## 2023-09-24 DIAGNOSIS — C50112 Malignant neoplasm of central portion of left female breast: Secondary | ICD-10-CM | POA: Insufficient documentation

## 2023-09-24 DIAGNOSIS — N62 Hypertrophy of breast: Secondary | ICD-10-CM | POA: Diagnosis not present

## 2023-09-24 DIAGNOSIS — G8918 Other acute postprocedural pain: Secondary | ICD-10-CM | POA: Diagnosis not present

## 2023-09-24 DIAGNOSIS — Z01818 Encounter for other preprocedural examination: Secondary | ICD-10-CM

## 2023-09-24 DIAGNOSIS — Z923 Personal history of irradiation: Secondary | ICD-10-CM | POA: Insufficient documentation

## 2023-09-24 DIAGNOSIS — C50912 Malignant neoplasm of unspecified site of left female breast: Secondary | ICD-10-CM | POA: Diagnosis not present

## 2023-09-24 HISTORY — PX: TOTAL MASTECTOMY: SHX6129

## 2023-09-24 HISTORY — PX: SENTINEL NODE BIOPSY: SHX6608

## 2023-09-24 LAB — POCT PREGNANCY, URINE: Preg Test, Ur: NEGATIVE

## 2023-09-24 SURGERY — MASTECTOMY, SIMPLE
Anesthesia: Regional | Site: Breast | Laterality: Left

## 2023-09-24 MED ORDER — MIDAZOLAM HCL 2 MG/2ML IJ SOLN
INTRAMUSCULAR | Status: AC
  Filled 2023-09-24: qty 2

## 2023-09-24 MED ORDER — ACETAMINOPHEN 500 MG PO TABS
1000.0000 mg | ORAL_TABLET | ORAL | Status: AC
  Administered 2023-09-24: 1000 mg via ORAL

## 2023-09-24 MED ORDER — MORPHINE SULFATE (PF) 4 MG/ML IV SOLN: 1.0000 mg | INTRAVENOUS | Status: DC | PRN

## 2023-09-24 MED ORDER — HYDROMORPHONE HCL 1 MG/ML IJ SOLN: 0.2500 mg | INTRAMUSCULAR | Status: DC | PRN

## 2023-09-24 MED ORDER — LACTATED RINGERS IV SOLN: INTRAVENOUS | Status: DC

## 2023-09-24 MED ORDER — PROPOFOL 500 MG/50ML IV EMUL
INTRAVENOUS | Status: AC
  Filled 2023-09-24: qty 50

## 2023-09-24 MED ORDER — DEXAMETHASONE SODIUM PHOSPHATE 4 MG/ML IJ SOLN
INTRAMUSCULAR | Status: DC | PRN
Start: 2023-09-24 — End: 2023-09-24
  Administered 2023-09-24: 10 mg via INTRAVENOUS

## 2023-09-24 MED ORDER — KETAMINE HCL 50 MG/5ML IJ SOSY
PREFILLED_SYRINGE | INTRAMUSCULAR | Status: AC
  Filled 2023-09-24: qty 5

## 2023-09-24 MED ORDER — CELECOXIB 200 MG PO CAPS
200.0000 mg | ORAL_CAPSULE | Freq: Two times a day (BID) | ORAL | Status: DC
  Administered 2023-09-24: 200 mg via ORAL
  Filled 2023-09-24: qty 1

## 2023-09-24 MED ORDER — ONDANSETRON HCL 4 MG/2ML IJ SOLN: 4.0000 mg | Freq: Four times a day (QID) | INTRAMUSCULAR | Status: DC | PRN

## 2023-09-24 MED ORDER — ONDANSETRON HCL 4 MG/2ML IJ SOLN
INTRAMUSCULAR | Status: DC | PRN
  Administered 2023-09-24: 4 mg via INTRAVENOUS

## 2023-09-24 MED ORDER — ACETAMINOPHEN 10 MG/ML IV SOLN: 1000.0000 mg | Freq: Once | INTRAVENOUS | Status: DC | PRN

## 2023-09-24 MED ORDER — GABAPENTIN 100 MG PO CAPS: 100.0000 mg | ORAL_CAPSULE | Freq: Two times a day (BID) | ORAL | 1 refills | Status: DC

## 2023-09-24 MED ORDER — FENTANYL CITRATE (PF) 100 MCG/2ML IJ SOLN
100.0000 ug | Freq: Once | INTRAMUSCULAR | Status: AC
  Administered 2023-09-24: 50 ug via INTRAVENOUS

## 2023-09-24 MED ORDER — METHOCARBAMOL 500 MG PO TABS
500.0000 mg | ORAL_TABLET | Freq: Four times a day (QID) | ORAL | 2 refills | Status: AC | PRN
Start: 2023-09-24 — End: ?

## 2023-09-24 MED ORDER — FENTANYL CITRATE (PF) 100 MCG/2ML IJ SOLN
INTRAMUSCULAR | Status: AC
  Filled 2023-09-24: qty 2

## 2023-09-24 MED ORDER — ACETAMINOPHEN 500 MG PO TABS
ORAL_TABLET | ORAL | Status: AC
  Filled 2023-09-24: qty 2

## 2023-09-24 MED ORDER — DIPHENHYDRAMINE HCL 50 MG/ML IJ SOLN
12.5000 mg | Freq: Four times a day (QID) | INTRAMUSCULAR | Status: DC | PRN
Start: 2023-09-24 — End: 2023-09-25

## 2023-09-24 MED ORDER — METHOCARBAMOL 500 MG PO TABS
500.0000 mg | ORAL_TABLET | Freq: Four times a day (QID) | ORAL | Status: DC | PRN
Start: 2023-09-24 — End: 2023-09-25
  Administered 2023-09-24: 500 mg via ORAL
  Filled 2023-09-24 (×2): qty 1

## 2023-09-24 MED ORDER — DEXAMETHASONE SODIUM PHOSPHATE 10 MG/ML IJ SOLN
INTRAMUSCULAR | Status: AC
  Filled 2023-09-24: qty 1

## 2023-09-24 MED ORDER — KETAMINE HCL 10 MG/ML IJ SOLN
INTRAMUSCULAR | Status: DC | PRN
  Administered 2023-09-24: 10 mg via INTRAVENOUS
  Administered 2023-09-24: 20 mg via INTRAVENOUS

## 2023-09-24 MED ORDER — CEFAZOLIN SODIUM-DEXTROSE 1-4 GM/50ML-% IV SOLN
INTRAVENOUS | Status: AC
  Filled 2023-09-24: qty 50

## 2023-09-24 MED ORDER — ONDANSETRON 4 MG PO TBDP: 4.0000 mg | ORAL_TABLET | Freq: Four times a day (QID) | ORAL | 0 refills | Status: DC | PRN

## 2023-09-24 MED ORDER — ALBUMIN HUMAN 5 % IV SOLN
INTRAVENOUS | Status: DC | PRN
Start: 2023-09-24 — End: 2023-09-24

## 2023-09-24 MED ORDER — FENTANYL CITRATE (PF) 100 MCG/2ML IJ SOLN
INTRAMUSCULAR | Status: AC
Start: 2023-09-24 — End: 2023-09-24
  Filled 2023-09-24: qty 2

## 2023-09-24 MED ORDER — OXYCODONE HCL 5 MG/5ML PO SOLN: 5.0000 mg | Freq: Once | ORAL | Status: DC | PRN

## 2023-09-24 MED ORDER — SENNA 8.6 MG PO TABS: 1.0000 | ORAL_TABLET | Freq: Two times a day (BID) | ORAL | 0 refills | Status: DC

## 2023-09-24 MED ORDER — TRAMADOL HCL 50 MG PO TABS
50.0000 mg | ORAL_TABLET | Freq: Four times a day (QID) | ORAL | Status: DC | PRN
  Administered 2023-09-24: 50 mg via ORAL
  Filled 2023-09-24: qty 1

## 2023-09-24 MED ORDER — AMISULPRIDE (ANTIEMETIC) 5 MG/2ML IV SOLN: 10.0000 mg | Freq: Once | INTRAVENOUS | Status: DC | PRN

## 2023-09-24 MED ORDER — FENTANYL CITRATE (PF) 100 MCG/2ML IJ SOLN
INTRAMUSCULAR | Status: DC | PRN
  Administered 2023-09-24 (×2): 50 ug via INTRAVENOUS

## 2023-09-24 MED ORDER — LIDOCAINE HCL (CARDIAC) PF 100 MG/5ML IV SOSY
PREFILLED_SYRINGE | INTRAVENOUS | Status: DC | PRN
  Administered 2023-09-24: 100 mg via INTRAVENOUS

## 2023-09-24 MED ORDER — MAGTRACE LYMPHATIC TRACER
INTRAMUSCULAR | Status: DC | PRN
  Administered 2023-09-24: 2 mL via INTRAMUSCULAR

## 2023-09-24 MED ORDER — CEFAZOLIN SODIUM-DEXTROSE 1-4 GM/50ML-% IV SOLN
INTRAVENOUS | Status: DC | PRN
  Administered 2023-09-24: 1 g via INTRAVENOUS

## 2023-09-24 MED ORDER — GABAPENTIN 100 MG PO CAPS
100.0000 mg | ORAL_CAPSULE | Freq: Two times a day (BID) | ORAL | Status: DC
  Administered 2023-09-24: 100 mg via ORAL

## 2023-09-24 MED ORDER — PROPOFOL 10 MG/ML IV BOLUS
INTRAVENOUS | Status: AC
Start: 2023-09-24 — End: 2023-09-24
  Filled 2023-09-24: qty 20

## 2023-09-24 MED ORDER — ACETAMINOPHEN 500 MG PO TABS
1000.0000 mg | ORAL_TABLET | Freq: Four times a day (QID) | ORAL | Status: DC
  Administered 2023-09-24 – 2023-09-25 (×2): 1000 mg via ORAL
  Filled 2023-09-24 (×2): qty 2

## 2023-09-24 MED ORDER — 0.9 % SODIUM CHLORIDE (POUR BTL) OPTIME
TOPICAL | Status: DC | PRN
  Administered 2023-09-24: 500 mL

## 2023-09-24 MED ORDER — ONDANSETRON 4 MG PO TBDP
4.0000 mg | ORAL_TABLET | Freq: Four times a day (QID) | ORAL | Status: DC | PRN
Start: 2023-09-24 — End: 2023-09-25

## 2023-09-24 MED ORDER — LIDOCAINE 2% (20 MG/ML) 5 ML SYRINGE
INTRAMUSCULAR | Status: AC
  Filled 2023-09-24: qty 5

## 2023-09-24 MED ORDER — ROPIVACAINE HCL 5 MG/ML IJ SOLN
INTRAMUSCULAR | Status: DC | PRN
  Administered 2023-09-24 (×2): 30 mL

## 2023-09-24 MED ORDER — CEFAZOLIN SODIUM-DEXTROSE 2-4 GM/100ML-% IV SOLN
2.0000 g | INTRAVENOUS | Status: AC
  Administered 2023-09-24: 2 g via INTRAVENOUS

## 2023-09-24 MED ORDER — PROPOFOL 500 MG/50ML IV EMUL
INTRAVENOUS | Status: DC | PRN
  Administered 2023-09-24: 50 ug/kg/min via INTRAVENOUS

## 2023-09-24 MED ORDER — ONDANSETRON HCL 4 MG/2ML IJ SOLN
INTRAMUSCULAR | Status: AC
  Filled 2023-09-24: qty 2

## 2023-09-24 MED ORDER — ONDANSETRON HCL 4 MG/2ML IJ SOLN: 4.0000 mg | Freq: Once | INTRAMUSCULAR | Status: DC | PRN

## 2023-09-24 MED ORDER — CEFAZOLIN SODIUM-DEXTROSE 2-4 GM/100ML-% IV SOLN
2.0000 g | Freq: Three times a day (TID) | INTRAVENOUS | Status: AC
  Administered 2023-09-24: 2 g via INTRAVENOUS
  Filled 2023-09-24: qty 100

## 2023-09-24 MED ORDER — PROPOFOL 10 MG/ML IV BOLUS
INTRAVENOUS | Status: DC | PRN
Start: 2023-09-24 — End: 2023-09-24
  Administered 2023-09-24: 50 mg via INTRAVENOUS
  Administered 2023-09-24: 150 mg via INTRAVENOUS

## 2023-09-24 MED ORDER — PROPOFOL 10 MG/ML IV BOLUS
INTRAVENOUS | Status: AC
  Filled 2023-09-24: qty 20

## 2023-09-24 MED ORDER — PHENYLEPHRINE HCL (PRESSORS) 10 MG/ML IV SOLN
INTRAVENOUS | Status: DC | PRN
Start: 2023-09-24 — End: 2023-09-24
  Administered 2023-09-24 (×3): 80 ug via INTRAVENOUS

## 2023-09-24 MED ORDER — OXYCODONE HCL 5 MG PO TABS: 5.0000 mg | ORAL_TABLET | Freq: Once | ORAL | Status: DC | PRN

## 2023-09-24 MED ORDER — MIDAZOLAM HCL 2 MG/2ML IJ SOLN
2.0000 mg | Freq: Once | INTRAMUSCULAR | Status: AC
  Administered 2023-09-24: 2 mg via INTRAVENOUS

## 2023-09-24 MED ORDER — IBUPROFEN 600 MG PO TABS: 600.0000 mg | ORAL_TABLET | Freq: Three times a day (TID) | ORAL | 1 refills | Status: AC | PRN

## 2023-09-24 MED ORDER — KCL IN DEXTROSE-NACL 20-5-0.45 MEQ/L-%-% IV SOLN
INTRAVENOUS | Status: AC
  Filled 2023-09-24: qty 1000

## 2023-09-24 MED ORDER — CLONIDINE HCL (ANALGESIA) 100 MCG/ML EP SOLN
EPIDURAL | Status: DC | PRN
  Administered 2023-09-24 (×2): 100 ug

## 2023-09-24 MED ORDER — MELATONIN 3 MG PO TABS: 3.0000 mg | ORAL_TABLET | Freq: Every evening | ORAL | Status: DC | PRN

## 2023-09-24 MED ORDER — CEFAZOLIN SODIUM-DEXTROSE 2-4 GM/100ML-% IV SOLN
INTRAVENOUS | Status: AC
  Filled 2023-09-24: qty 100

## 2023-09-24 MED ORDER — SODIUM CHLORIDE 0.9 % IR SOLN
Status: DC | PRN
  Administered 2023-09-24: 1500 mg via TOPICAL

## 2023-09-24 MED ORDER — GABAPENTIN 100 MG PO CAPS
ORAL_CAPSULE | ORAL | Status: AC
  Filled 2023-09-24: qty 1

## 2023-09-24 MED ORDER — OXYCODONE HCL 5 MG PO TABS: 5.0000 mg | ORAL_TABLET | ORAL | Status: DC | PRN

## 2023-09-24 MED ORDER — ACETAMINOPHEN 500 MG PO TABS: 1000.0000 mg | ORAL_TABLET | Freq: Four times a day (QID) | ORAL | 0 refills | Status: AC

## 2023-09-24 MED ORDER — DIPHENHYDRAMINE HCL 12.5 MG/5ML PO ELIX
12.5000 mg | ORAL_SOLUTION | Freq: Four times a day (QID) | ORAL | Status: DC | PRN
Start: 2023-09-24 — End: 2023-09-25

## 2023-09-24 MED ORDER — PROPOFOL 1000 MG/100ML IV EMUL
INTRAVENOUS | Status: AC
  Filled 2023-09-24: qty 100

## 2023-09-24 MED ORDER — PHENYLEPHRINE 80 MCG/ML (10ML) SYRINGE FOR IV PUSH (FOR BLOOD PRESSURE SUPPORT)
PREFILLED_SYRINGE | INTRAVENOUS | Status: AC
  Filled 2023-09-24: qty 10

## 2023-09-24 MED ORDER — OXYCODONE HCL 5 MG PO TABS: 5.0000 mg | ORAL_TABLET | ORAL | 0 refills | Status: DC | PRN

## 2023-09-24 MED ORDER — EPHEDRINE SULFATE (PRESSORS) 50 MG/ML IJ SOLN
INTRAMUSCULAR | Status: DC | PRN
Start: 2023-09-24 — End: 2023-09-24
  Administered 2023-09-24 (×2): 10 mg via INTRAVENOUS
  Administered 2023-09-24: 5 mg via INTRAVENOUS

## 2023-09-24 MED ORDER — EPHEDRINE 5 MG/ML INJ
INTRAVENOUS | Status: AC
  Filled 2023-09-24: qty 5

## 2023-09-24 MED ORDER — SENNA 8.6 MG PO TABS: 1.0000 | ORAL_TABLET | Freq: Two times a day (BID) | ORAL | Status: DC

## 2023-09-24 SURGICAL SUPPLY — 58 items
BAG DECANTER FOR FLEXI CONT (MISCELLANEOUS) IMPLANT
BINDER BREAST LRG (GAUZE/BANDAGES/DRESSINGS) IMPLANT
BINDER BREAST MEDIUM (GAUZE/BANDAGES/DRESSINGS) IMPLANT
BINDER BREAST XLRG (GAUZE/BANDAGES/DRESSINGS) IMPLANT
BINDER BREAST XXLRG (GAUZE/BANDAGES/DRESSINGS) IMPLANT
BIOPATCH RED 1 DISK 7.0 (GAUZE/BANDAGES/DRESSINGS) IMPLANT
BLADE HEX COATED 2.75 (ELECTRODE) ×3 IMPLANT
BLADE SURG 10 STRL SS (BLADE) ×6 IMPLANT
BNDG COMPR ESMARK 4X3 LF (GAUZE/BANDAGES/DRESSINGS) ×3 IMPLANT
CANISTER SUCT 1200ML W/VALVE (MISCELLANEOUS) ×3 IMPLANT
CHLORAPREP W/TINT 26 (MISCELLANEOUS) ×3 IMPLANT
CLIP TI MEDIUM 6 (CLIP) ×6 IMPLANT
COVER MAYO STAND STRL (DRAPES) ×3 IMPLANT
COVER PROBE CYLINDRICAL 5X96 (MISCELLANEOUS) ×3 IMPLANT
DERMABOND ADVANCED .7 DNX12 (GAUZE/BANDAGES/DRESSINGS) ×3 IMPLANT
DRAIN CHANNEL 19F RND (DRAIN) ×3 IMPLANT
DRAPE UTILITY XL STRL (DRAPES) ×3 IMPLANT
ELECTRODE BLDE 4.0 EZ CLN MEGD (MISCELLANEOUS) IMPLANT
ELECTRODE REM PT RTRN 9FT ADLT (ELECTROSURGICAL) ×3 IMPLANT
EVACUATOR SILICONE 100CC (DRAIN) ×3 IMPLANT
GAUZE PAD ABD 8X10 STRL (GAUZE/BANDAGES/DRESSINGS) ×6 IMPLANT
GAUZE SPONGE 4X4 12PLY STRL (GAUZE/BANDAGES/DRESSINGS) ×3 IMPLANT
GLOVE BIO SURGEON STRL SZ 6 (GLOVE) ×3 IMPLANT
GLOVE BIOGEL PI IND STRL 6.5 (GLOVE) ×3 IMPLANT
GOWN STRL REUS W/ TWL LRG LVL3 (GOWN DISPOSABLE) ×3 IMPLANT
GOWN STRL REUS W/ TWL XL LVL3 (GOWN DISPOSABLE) ×3 IMPLANT
LIGHT WAVEGUIDE WIDE FLAT (MISCELLANEOUS) IMPLANT
NDL HYPO 25X1 1.5 SAFETY (NEEDLE) ×3 IMPLANT
NDL SAFETY ECLIPSE 18X1.5 (NEEDLE) ×3 IMPLANT
NDL SPNL 18GX3.5 QUINCKE PK (NEEDLE) IMPLANT
NDL SPNL 22GX3.5 QUINCKE BK (NEEDLE) IMPLANT
NEEDLE HYPO 25X1 1.5 SAFETY (NEEDLE) ×2 IMPLANT
NEEDLE SPNL 18GX3.5 QUINCKE PK (NEEDLE) IMPLANT
NEEDLE SPNL 22GX3.5 QUINCKE BK (NEEDLE) IMPLANT
NS IRRIG 1000ML POUR BTL (IV SOLUTION) ×3 IMPLANT
PACK BASIN DAY SURGERY FS (CUSTOM PROCEDURE TRAY) ×3 IMPLANT
PACK UNIVERSAL I (CUSTOM PROCEDURE TRAY) ×3 IMPLANT
PENCIL SMOKE EVACUATOR (MISCELLANEOUS) ×3 IMPLANT
PIN SAFETY STERILE (MISCELLANEOUS) ×3 IMPLANT
SLEEVE SCD COMPRESS KNEE MED (STOCKING) ×3 IMPLANT
SPIKE FLUID TRANSFER (MISCELLANEOUS) IMPLANT
SPONGE T-LAP 18X18 ~~LOC~~+RFID (SPONGE) ×3 IMPLANT
STAPLER SKIN PROX WIDE 3.9 (STAPLE) IMPLANT
STOCKINETTE IMPERVIOUS LG (DRAPES) ×3 IMPLANT
STRIP CLOSURE SKIN 1/2X4 (GAUZE/BANDAGES/DRESSINGS) ×3 IMPLANT
SUT ETHILON 2 0 FS 18 (SUTURE) IMPLANT
SUT MNCRL AB 4-0 PS2 18 (SUTURE) ×6 IMPLANT
SUT SILK 0 TIES 10X30 (SUTURE) IMPLANT
SUT SILK 2 0 SH (SUTURE) ×3 IMPLANT
SUT VICRYL 3-0 CR8 SH (SUTURE) ×6 IMPLANT
SUT VICRYL AB 2 0 TIE (SUTURE) IMPLANT
SYR BULB IRRIG 60ML STRL (SYRINGE) ×3 IMPLANT
SYR CONTROL 10ML LL (SYRINGE) IMPLANT
TOWEL GREEN STERILE FF (TOWEL DISPOSABLE) ×3 IMPLANT
TRACER MAGTRACE VIAL (MISCELLANEOUS) IMPLANT
TUBE CONNECTING 20X1/4 (TUBING) ×3 IMPLANT
UNDERPAD 30X36 HEAVY ABSORB (UNDERPADS AND DIAPERS) ×3 IMPLANT
YANKAUER SUCT BULB TIP NO VENT (SUCTIONS) ×3 IMPLANT

## 2023-09-24 NOTE — Interval H&P Note (Signed)
 History and Physical Interval Note:  09/24/2023 9:31 AM  Janice Crawford  has presented today for surgery, with the diagnosis of RECURRENT LEFT BREAST CANCER, PALB2 mutation.  The various methods of treatment have been discussed with the patient and family. After consideration of risks, benefits and other options for treatment, the patient has consented to  Procedure(s) with comments:BILATERAL MASTECTOMY, LEFT SENTINEL LYMPH NODE BIOPSY as a surgical intervention.  The patient's history has been reviewed, patient examined, no change in status, stable for surgery.  I have reviewed the patient's chart and labs.  Questions were answered to the patient's satisfaction.     [name]

## 2023-09-24 NOTE — Op Note (Signed)
 Bilateral Mastectomy with Left Sentinel Node Biopsy Procedure Note  Indications: This patient presents with history of new left breast cancer central quadrant, grade 2 invasive ductal carcinoma, ER+, weakly PR+, Her 2 -, Ki 67 1%; mT1cN0; PALB2 mutation and history of stage IA left breast cancer 2015, ER/PR +, ER 2 negative, oncotype 23, tx with lumpectomy, SLN bx (0/2 nodes positive), XRT.    Pre-operative Diagnosis: left breast cancer, cT1cN0M0, central quadrant, receptors +/weak+/-  Post-operative Diagnosis: same  Surgeon: ARON SHOULDERS   Assistant:  n/a  Anesthesia: General endotracheal anesthesia and pectoral block  ASA Class: 3  Procedure Details  The patient was seen in the Holding Room. The risks, benefits, complications, treatment options, and expected outcomes were discussed with the patient. The possibilities of reaction to medication, pulmonary aspiration, bleeding, infection, the need for additional procedures, failure to diagnose a condition, and creating a complication requiring transfusion or operation were discussed with the patient. The patient concurred with the proposed plan, giving informed consent.  The site of surgery properly noted/marked. The patient was taken to Operating Room # 8, identified as Janice Crawford, and the procedure verified as bilateral Mastectomy and left Sentinel Node Biopsy. A Time Out was held and the above information confirmed.  The magtrace was injected in the left subareolar location and massaged.  After induction of anesthesia, the bilateral breast, chest, and left arm were prepped and draped in standard fashion.  The borders of the breast were identified and marked.  The right side was addressed first.  Due to the breast size, a Wise/anchor type incision was drawn out to minimize redundant skin/dog ears. The incision was made with the #10 blade.  Mastectomy hooks were used to provide elevation of the skin edges, and the cautery was used to create  the mastectomy flaps.  The dissection was taken to the fascia of the pectoralis major.  The penetrating vessels were clipped as needed.  The dissection was taken medially to the lateral sternal border, superiorly to the inferior border of the clavicle.  Inferiorly the dissection was taken  to the inframammary fold and laterally to the border of the latissimus.  The breast was taken off including the pectoralis fascia and the axillary tail marked.  Hemostasis was achieved with cautery.   The wound was irrigated.  A lap soaked with TXA was placed into the wound to assist with long term hemostasis.  One 19 Blake drain was placed laterally and secured with a 2-0 nylon.  The wound was reinspected meticulously for hemostasis and closed with a 3-0 Vicryl deep dermal interrupted sutures and 4-0 Vicryl subcuticular closure in layers.    The left side was addressed similarly, but the medial incision was adjusted a bit to incorporate the prior lumpectomy incision.    Using a sentimag probe, axillary sentinel nodes were identified. There was only one hot node identified in this previously dissected axilla.  Signal was 110.    The wound was irrigated.  A lap soaked with TXA was placed into the wound to assist with long term hemostasis.  One 19 Blake drain was placed laterally and secured with a 2-0 nylon.  The wound was reinspected meticulously for hemostasis and closed with a 3-0 Vicryl deep dermal interrupted sutures and 4-0 Vicryl subcuticular closure in layers.      Sterile dressings were applied. At the end of the operation, all sponge, instrument, and needle counts were correct.  Findings: grossly clear surgical margins, no palpable nodes.   Estimated  Blood Loss: min          Drains: 19 Fr blake drain in each chest wall                Specimens: right breast, left breast,  and left axillary sentinel node         Complications:  None; patient tolerated the procedure well.         Disposition: PACU -  hemodynamically stable.         Condition: stable

## 2023-09-24 NOTE — Progress Notes (Signed)
 Assisted Dr. Richardson Landry with left, right, pectoralis, ultrasound guided block. Side rails up, monitors on throughout procedure. See vital signs in flow sheet. Tolerated Procedure well.

## 2023-09-24 NOTE — Discharge Instructions (Signed)
 [facility] [place] [phone]  MASTECTOMY: POST OP INSTRUCTIONS  Always review your discharge instruction sheet given to you by the facility where your surgery was performed. IF YOU HAVE DISABILITY OR FAMILY LEAVE FORMS, YOU MUST BRING THEM TO THE OFFICE FOR PROCESSING.   DO NOT GIVE THEM TO YOUR DOCTOR. Take 2 tylenol  (acetominophen) three times a day for 3 days.  If you still have pain, add ibuprofen  with food in between if able to take this (if you have kidney  issues or stomach issues, do not take ibuprofen ).  There is also a muscle relaxant for muscle spasms.    I have written a script for gabapentin .  This is helpful for burning type pain and is useful for several weeks to take twice daily.   If both of those are not enough, add the narcotic pain pill.    If you find you are needing a lot of this overnight after surgery, call the next morning for a refill.    Take your usually prescribed medications unless otherwise directed. If you need a refill on your pain medication, please contact your pharmacy.  They will contact our office to request authorization.  Prescriptions will not be filled after 5pm or on week-ends. You should follow a light diet the first few days after arrival home, such as soup and crackers, etc.  Resume your normal diet the day after surgery. Most patients will experience some swelling and bruising on the chest and underarm.  Ice packs will help.  Swelling and bruising can take several days to resolve.  It is common to experience some constipation if taking pain medication after surgery.  Increasing fluid intake and taking a stool softener (such as Colace) will usually help or prevent this problem from occurring.  A mild laxative (Milk of Magnesia or Miralax) should be taken according to package instructions if there are no bowel movements after 48 hours. You may remove the gauze from under the binder and replace as needed.  Keep the drains pinned  under the binder unless you are emptying them.  SHORT showers only are permitted after post op day 3.  DRAINS:  If you have drains in place, it is important to keep a list of the amount of drainage produced each day in your drains.  Before leaving the hospital, you should be instructed on drain care.  Call our office if you have any questions about your drains. ACTIVITIES:  You may resume regular (light) daily activities beginning the next day--such as daily self-care, walking, climbing stairs--gradually increasing activities as tolerated.  You may have sexual intercourse when it is comfortable.  Refrain from any heavy lifting or straining until approved by your doctor. You may drive when you are no longer taking prescription pain medication, you can comfortably wear a seatbelt, and you can safely maneuver your car and apply brakes. RETURN TO WORK:  __________________________________________________________ Janice Crawford should see your doctor in the office for a follow-up appointment approximately 3-5 days after your surgery.  Your doctor's nurse will typically make your follow-up appointment when she calls you with your pathology report.  Expect your pathology report 2-3 business days after your surgery.  You may call to check if you do not hear from us  after three days.   OTHER INSTRUCTIONS: ______________________________________________________________________________________________ ____________________________________________________________________________________________ WHEN TO CALL YOUR DOCTOR: Fever over 101.0 Nausea and/or vomiting Extreme swelling or bruising Continued bleeding from incision. Increased pain, redness, or drainage from the incision. The clinic staff is available to  answer your questions during regular business hours.  Please don't hesitate to call and ask to speak to one of the nurses for clinical concerns.  If you have a medical emergency, go to the nearest emergency room or call 911.   A surgeon from [facility] is always on call at the hospital. [address], [address], [place], [place]  [address] ? [address] [address], [place], [place]   [address] [phone] ? [phone] ? FAX [phone] Web site: [email or web address]   About my Jackson-Pratt Bulb Drain  What is a Jackson-Pratt bulb? A Jackson-Pratt is a soft, round device used to collect drainage. It is connected to a long, thin drainage catheter, which is held in place by one or two small stiches near your surgical incision site. When the bulb is squeezed, it forms a vacuum, forcing the drainage to empty into the bulb.  Emptying the Jackson-Pratt bulb- To empty the bulb: 1. Release the plug on the top of the bulb. 2. Pour the bulb's contents into a measuring container which your nurse will provide. 3. Record the time emptied and amount of drainage. Empty the drain(s) as often as your     doctor or nurse recommends.  Date                  Time                    Amount (Left drain)                 Amount (Right Drain)  _____________________________________________________________________  _____________________________________________________________________  _____________________________________________________________________  _____________________________________________________________________  _____________________________________________________________________  _____________________________________________________________________  _____________________________________________________________________  _____________________________________________________________________  Squeezing the Jackson-Pratt Bulb- To squeeze the bulb: 1. Make sure the plug at the top of the bulb is open. 2. Squeeze the bulb tightly in your fist. You will hear air squeezing from the bulb. 3. Replace the plug while the bulb is squeezed. 4. Use a safety pin to attach the bulb to your clothing. This will keep the catheter  from     pulling at the bulb insertion site.  When to call your doctor- Call your doctor if: Drain site becomes red, swollen or hot. You have a fever greater than 101 degrees F. There is oozing at the drain site. Drain falls out (apply a guaze bandage over the drain hole and secure it with tape). Drainage increases daily not related to activity patterns. (You will usually have more drainage when you are active than when you are resting.) Drainage has a bad odor.

## 2023-09-24 NOTE — Anesthesia Postprocedure Evaluation (Signed)
 Anesthesia Post Note  Patient: Janice Crawford  Procedure(s) Performed: MASTECTOMY, SIMPLE (Bilateral: Breast) BIOPSY, LYMPH NODE, SENTINEL (Left: Axilla)     Patient location during evaluation: PACU Anesthesia Type: Regional and General Level of consciousness: awake and alert Pain management: pain level controlled Vital Signs Assessment: post-procedure vital signs reviewed and stable Respiratory status: spontaneous breathing, nonlabored ventilation, respiratory function stable and patient connected to nasal cannula oxygen Cardiovascular status: blood pressure returned to baseline and stable Postop Assessment: no apparent nausea or vomiting Anesthetic complications: no   No notable events documented.  Last Vitals:  Vitals:   09/24/23 1445 09/24/23 1500  BP: 128/82 106/69  Pulse: 88 83  Resp: 12 16  Temp:    SpO2: 100% 97%    Last Pain:  Vitals:   09/24/23 1526  TempSrc:   PainSc: 4                  Garnette DELENA Gab

## 2023-09-24 NOTE — Anesthesia Procedure Notes (Signed)
 Procedure Name: LMA Insertion Date/Time: 09/24/2023 9:57 AM  Performed by: Pam Macario BROCKS, CRNAPre-anesthesia Checklist: Patient identified, Emergency Drugs available, Suction available, Patient being monitored and Timeout performed Patient Re-evaluated:Patient Re-evaluated prior to induction Oxygen Delivery Method: Circle system utilized Preoxygenation: Pre-oxygenation with 100% oxygen Induction Type: IV induction Ventilation: Mask ventilation without difficulty LMA: LMA inserted LMA Size: 4.0 Number of attempts: 1 Airway Equipment and Method: Bite block Placement Confirmation: positive ETCO2, breath sounds checked- equal and bilateral and CO2 detector Tube secured with: Tape Dental Injury: Teeth and Oropharynx as per pre-operative assessment

## 2023-09-24 NOTE — Transfer of Care (Signed)
 Immediate Anesthesia Transfer of Care Note  Patient: Janice Crawford  Procedure(s) Performed: Procedure(s) (LRB): MASTECTOMY, SIMPLE (Bilateral) BIOPSY, LYMPH NODE, SENTINEL (Left)  Patient Location: PACU  Anesthesia Type: General  Level of Consciousness: awake, oriented, sedated and patient cooperative  Airway & Oxygen Therapy: Patient Spontanous Breathing and Patient connected to face mask oxygen  Post-op Assessment: Report given to PACU RN and Post -op Vital signs reviewed and stable  Post vital signs: Reviewed and stable  Complications: No apparent anesthesia complications  Last Vitals:  Vitals Value Taken Time  BP    Temp    Pulse 85 09/24/23 14:29  Resp 14 09/24/23 14:29  SpO2 93 % 09/24/23 14:29  Vitals shown include unfiled device data.  Last Pain:  Vitals:   09/24/23 0830  TempSrc: Temporal  PainSc: 0-No pain         Complications: No notable events documented.

## 2023-09-24 NOTE — H&P (View-Only) (Signed)
 Assisted Dr. Richardson Landry with left, right, pectoralis, ultrasound guided block. Side rails up, monitors on throughout procedure. See vital signs in flow sheet. Tolerated Procedure well.

## 2023-09-24 NOTE — Interval H&P Note (Signed)
 History and Physical Interval Note:  09/24/2023 9:32 AM  Janice Crawford  has presented today for surgery, with the diagnosis of RECURRENT LEFT BREAST CANCER, PALB2 mutation.  The various methods of treatment have been discussed with the patient and family. After consideration of risks, benefits and other options for treatment, the patient has consented to  Procedure(s) with comments: BILATERAL MASTECTOMY, LEFT SENTINEL LYMPH NODE BIOPSY as a surgical intervention.  The patient's history has been reviewed, patient examined, no change in status, stable for surgery.  I have reviewed the patient's chart and labs.  Questions were answered to the patient's satisfaction.     [name]

## 2023-09-24 NOTE — Anesthesia Procedure Notes (Signed)
 Anesthesia Regional Block: Pectoralis block   Pre-Anesthetic Checklist: , timeout performed,  Correct Patient, Correct Site, Correct Laterality,  Correct Procedure, Correct Position, site marked,  Risks and benefits discussed,  Surgical consent,  Pre-op evaluation,  At surgeon's request and post-op pain management  Laterality: Left and Upper  Prep: chloraprep       Needles:  Injection technique: Single-shot  Needle Type: Echogenic Needle     Needle Length: 9cm  Needle Gauge: 21     Additional Needles:   Procedures:,,,, ultrasound used (permanent image in chart),,    Narrative:  Start time: 09/24/2023 8:51 AM End time: 09/24/2023 8:56 AM Injection made incrementally with aspirations every 5 mL.  Performed by: Personally  Anesthesiologist: Jefm Garnette LABOR, MD  Additional Notes: Block assessed. Patient tolerated procedure well.

## 2023-09-24 NOTE — Anesthesia Procedure Notes (Signed)
 Anesthesia Regional Block: Pectoralis block   Pre-Anesthetic Checklist: , timeout performed,  Correct Patient, Correct Site, Correct Laterality,  Correct Procedure, Correct Position, site marked,  Risks and benefits discussed,  Surgical consent,  Pre-op evaluation,  At surgeon's request and post-op pain management  Laterality: Right and Upper  Prep: chloraprep       Needles:  Injection technique: Single-shot  Needle Type: Echogenic Needle     Needle Length: 9cm  Needle Gauge: 21     Additional Needles:   Procedures:,,,, ultrasound used (permanent image in chart),,    Narrative:  Start time: 09/24/2023 8:57 AM End time: 09/24/2023 9:01 AM Injection made incrementally with aspirations every 5 mL.  Performed by: Personally  Anesthesiologist: Jefm Garnette LABOR, MD  Additional Notes: Block assessed. Patient tolerated procedure well.

## 2023-09-25 ENCOUNTER — Encounter (HOSPITAL_BASED_OUTPATIENT_CLINIC_OR_DEPARTMENT_OTHER): Payer: Self-pay | Admitting: General Surgery

## 2023-09-25 ENCOUNTER — Encounter: Payer: Self-pay | Admitting: General Surgery

## 2023-09-30 ENCOUNTER — Ambulatory Visit: Payer: Self-pay | Admitting: General Surgery

## 2023-09-30 ENCOUNTER — Encounter: Payer: Self-pay | Admitting: *Deleted

## 2023-10-05 LAB — SURGICAL PATHOLOGY

## 2023-10-06 DIAGNOSIS — Z419 Encounter for procedure for purposes other than remedying health state, unspecified: Secondary | ICD-10-CM | POA: Diagnosis not present

## 2023-10-13 ENCOUNTER — Encounter: Payer: Self-pay | Admitting: *Deleted

## 2023-10-13 ENCOUNTER — Inpatient Hospital Stay: Admitting: Hematology and Oncology

## 2023-10-13 NOTE — Assessment & Plan Note (Deleted)
 Left lumpectomy 04/19/2014: IDC grade 2, 1.5 cm, intermediate grade DCIS, 0/2 lymph nodes, ER 90%, PR 90%, HER-2 negative ratio 1.33, Ki-67 20%,  Oncotype DX recurrence score 23, 15% ROR Right lumpectomy: Fibrocystic changes no malignancy. Completed adjuvant radiation 06/08/2014 to 07/28/2014 (Left breast   4500 cGy in 25 sessions, left breast boost 1800 cGy in 9 sessions), started tamoxifen  in June 2016     Relapse/recurrence: 06/18/2023:  Mammogram detected 1.7 cm mass left areola and 0.7 cm retroareolar mass and 1.2 cm retroareolar left breast mass: Biopsy: Grade 2 IDC, no LVI, ER 20%, PR 70%, Ki-67 1%, HER2 1+ negative   Treatment plan: 07/24/2023 CT chest abdomen pelvis and bone scan for staging: No evidence of metastatic disease 09/24/2023: Bilateral mastectomies: Right mastectomy: 2 residual intraductal papillomas 5 mm and 3 mm, left mastectomy: 2 foci of invasive cancer 2.7 cm, margins negative, 0/2 lymph nodes negative ER 80%, PR 80%, Ki67 1%, HER2 2+ by IHC, FISH negative ratio 0.86 Adjuvant antiestrogen therapy with tamoxifen  20 mg daily x 10 years  She has multiple social issues limiting her ability to be on top of her health.  She lives currently in [place].   Tamoxifen  counseling: We discussed the risks and benefits of tamoxifen . These include but not limited to insomnia, hot flashes, mood changes, vaginal dryness, and weight gain. Although rare, serious side effects including endometrial cancer, risk of blood clots were also discussed. We strongly believe that the benefits far outweigh the risks. Patient understands these risks and consented to starting treatment. Planned treatment duration is 10 years.  Return to clinic in 3 months for survivorship care plan visit

## 2023-10-14 ENCOUNTER — Telehealth: Payer: Self-pay | Admitting: Hematology and Oncology

## 2023-10-14 NOTE — Telephone Encounter (Signed)
 rescheduled appt with pt. aware of date and time

## 2023-10-19 ENCOUNTER — Encounter: Payer: Self-pay | Admitting: *Deleted

## 2023-10-22 ENCOUNTER — Telehealth: Payer: Self-pay | Admitting: Licensed Clinical Social Worker

## 2023-10-22 ENCOUNTER — Other Ambulatory Visit: Payer: Self-pay | Admitting: *Deleted

## 2023-10-22 ENCOUNTER — Encounter: Payer: Self-pay | Admitting: Hematology and Oncology

## 2023-10-22 DIAGNOSIS — Z170 Estrogen receptor positive status [ER+]: Secondary | ICD-10-CM

## 2023-10-22 NOTE — Telephone Encounter (Signed)
 [facility] Clinical Social Work   CSW contacted pt by phone regarding request for counseling.  Pt would like counseling to assist with peace of mind and rebuilding confidence after dealing with breast cancer recurrence, mastectomies, and psychosocial stressors with needing to move to [place] due to financial issues while out on leave.  Offered options of counseling through [facility] (virtual or in person) or referral out. Pt agreed to [facility] counseling with MSW intern & this CSW.  Scheduled for 9/12 at 11am.   Kaelani Kendrick E Delora Gravatt, LCSW

## 2023-11-02 ENCOUNTER — Inpatient Hospital Stay: Admitting: Hematology and Oncology

## 2023-11-02 ENCOUNTER — Telehealth: Payer: Self-pay

## 2023-11-02 NOTE — Assessment & Plan Note (Deleted)
 PALB2 gene mutation 04/19/2014: Left lumpectomy:  IDC grade 2, 1.5 cm, intermediate grade DCIS, 0/2 lymph nodes, ER 90%, PR 90%, HER-2 negative ratio 1.33, Ki-67 20%,  Oncotype DX recurrence score 23, 15% ROR  06/08/2014-07/28/2014: Adjuvant radiation June 2016: Tamoxifen  06/18/2023: Relapse/recurrence: Mammogram detected 1.7 cm mass left areola and 0.7 cm retroareolar mass and 1.2 cm retroareolar left breast mass: Biopsy: Grade 2 IDC, no LVI, ER 20%, PR 70%, Ki-67 1%, HER2 1+ negative  09/24/2023: Bilateral mastectomies: Right mastectomy: 2 residual intraductal papillomas 5 mm and 3 mm; left mastectomy: 2 foci of grade 2 IDC 2.7 cm and 0.8 cm, DCIS, 0/2 lymph nodes negative, ER 20%, PR 70%, Ki-67 1%, HER2 1+ negative, repeat prognostic panel: ER 80% positive, PR 80%, Ki-67 1%, HER2 2+ equivocal by IHC, FISH negative ratio 0.86  Pathology counseling: I discussed the final pathology report of the patient provided  a copy of this report. I discussed the margins as well as lymph node surgeries. We also discussed the final staging along with previously performed ER/PR and HER-2/neu testing.  Treatment plan: Ovarian function suppression with anastrozole would be ideal.

## 2023-11-02 NOTE — Telephone Encounter (Signed)
 Called pt regarding her missed appt. This morning with Dr.Gudena. Advise pt to call scheduling to reschedule her appointment. Pt verbalized understanding.

## 2023-11-03 ENCOUNTER — Encounter: Payer: Self-pay | Admitting: Hematology and Oncology

## 2023-11-03 ENCOUNTER — Encounter: Payer: Self-pay | Admitting: *Deleted

## 2023-11-06 ENCOUNTER — Inpatient Hospital Stay: Attending: Hematology and Oncology

## 2023-11-06 DIAGNOSIS — C50012 Malignant neoplasm of nipple and areola, left female breast: Secondary | ICD-10-CM | POA: Insufficient documentation

## 2023-11-06 DIAGNOSIS — Z923 Personal history of irradiation: Secondary | ICD-10-CM | POA: Insufficient documentation

## 2023-11-06 DIAGNOSIS — Z170 Estrogen receptor positive status [ER+]: Secondary | ICD-10-CM | POA: Insufficient documentation

## 2023-11-06 DIAGNOSIS — Z79899 Other long term (current) drug therapy: Secondary | ICD-10-CM | POA: Insufficient documentation

## 2023-11-06 DIAGNOSIS — Z9013 Acquired absence of bilateral breasts and nipples: Secondary | ICD-10-CM | POA: Insufficient documentation

## 2023-11-06 DIAGNOSIS — Z1501 Genetic susceptibility to malignant neoplasm of breast: Secondary | ICD-10-CM | POA: Insufficient documentation

## 2023-11-06 DIAGNOSIS — Z86718 Personal history of other venous thrombosis and embolism: Secondary | ICD-10-CM | POA: Insufficient documentation

## 2023-11-06 DIAGNOSIS — Z86711 Personal history of pulmonary embolism: Secondary | ICD-10-CM | POA: Insufficient documentation

## 2023-11-06 DIAGNOSIS — F329 Major depressive disorder, single episode, unspecified: Secondary | ICD-10-CM | POA: Insufficient documentation

## 2023-11-06 DIAGNOSIS — Z419 Encounter for procedure for purposes other than remedying health state, unspecified: Secondary | ICD-10-CM | POA: Diagnosis not present

## 2023-11-06 DIAGNOSIS — Z1721 Progesterone receptor positive status: Secondary | ICD-10-CM | POA: Insufficient documentation

## 2023-11-06 DIAGNOSIS — Z1732 Human epidermal growth factor receptor 2 negative status: Secondary | ICD-10-CM | POA: Insufficient documentation

## 2023-11-06 NOTE — Progress Notes (Signed)
 I joined the social work Tax inspector in patient visit. I concur with the treatment plan as documented in the social work intern's note.     Erving Sassano E Jeliyah Middlebrooks, LCSW  Clinical Child psychotherapist
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
## 2023-11-09 ENCOUNTER — Inpatient Hospital Stay (HOSPITAL_BASED_OUTPATIENT_CLINIC_OR_DEPARTMENT_OTHER): Admitting: Hematology and Oncology

## 2023-11-09 VITALS — BP 142/91 | HR 82 | Temp 97.9°F | Resp 18 | Ht 64.0 in | Wt 251.3 lb

## 2023-11-09 DIAGNOSIS — C50312 Malignant neoplasm of lower-inner quadrant of left female breast: Secondary | ICD-10-CM | POA: Diagnosis not present

## 2023-11-09 DIAGNOSIS — Z9013 Acquired absence of bilateral breasts and nipples: Secondary | ICD-10-CM | POA: Diagnosis not present

## 2023-11-09 DIAGNOSIS — F329 Major depressive disorder, single episode, unspecified: Secondary | ICD-10-CM | POA: Diagnosis not present

## 2023-11-09 DIAGNOSIS — Z79899 Other long term (current) drug therapy: Secondary | ICD-10-CM | POA: Diagnosis not present

## 2023-11-09 DIAGNOSIS — Z86711 Personal history of pulmonary embolism: Secondary | ICD-10-CM | POA: Diagnosis not present

## 2023-11-09 DIAGNOSIS — Z1732 Human epidermal growth factor receptor 2 negative status: Secondary | ICD-10-CM | POA: Diagnosis not present

## 2023-11-09 DIAGNOSIS — Z170 Estrogen receptor positive status [ER+]: Secondary | ICD-10-CM | POA: Diagnosis not present

## 2023-11-09 DIAGNOSIS — Z86718 Personal history of other venous thrombosis and embolism: Secondary | ICD-10-CM | POA: Diagnosis not present

## 2023-11-09 DIAGNOSIS — Z1721 Progesterone receptor positive status: Secondary | ICD-10-CM | POA: Diagnosis not present

## 2023-11-09 DIAGNOSIS — C50012 Malignant neoplasm of nipple and areola, left female breast: Secondary | ICD-10-CM | POA: Diagnosis not present

## 2023-11-09 DIAGNOSIS — Z923 Personal history of irradiation: Secondary | ICD-10-CM | POA: Diagnosis not present

## 2023-11-09 DIAGNOSIS — Z1501 Genetic susceptibility to malignant neoplasm of breast: Secondary | ICD-10-CM | POA: Diagnosis not present

## 2023-11-09 MED ORDER — VENLAFAXINE HCL ER 37.5 MG PO CP24: 37.5000 mg | ORAL_CAPSULE | Freq: Every day | ORAL | 3 refills | Status: AC

## 2023-11-09 NOTE — Progress Notes (Signed)
 Patient Care Team: Therisa Arabia, PA-C (Inactive) as PCP - General (Physician Assistant) Aron Shoulders, MD as Consulting Physician (General Surgery) Odean Potts, MD as Consulting Physician (Hematology and Oncology) Jason Charleston, MD (Inactive) as Consulting Physician (Radiation Oncology) Moses Powell Hummer, NP as Nurse Practitioner (Nurse Practitioner) Tyree Nanetta SAILOR, RN as Oncology Nurse Navigator  DIAGNOSIS:  Encounter Diagnosis  Name Primary?   Malignant neoplasm of lower-inner quadrant of left breast in female, estrogen receptor positive (HCC) Yes    SUMMARY OF ONCOLOGIC HISTORY: Oncology History  Cancer of lower-inner quadrant of left female breast (HCC)  12/06/2013 Breast MRI   Left breast middle depth 11 mm irregular mass no abnormal lymph nodes seen, Right breast lower inner quadrant 1.3 cm area; multiple cystic lesions throughout the breast on both sides   12/16/2013 Initial Biopsy   LEFT breast needle core bx: Invasive mammary carcinoma with mammary carcinoma in situ; lobular features, grade 2, ER+ (90%), PR+ (90%), Ki-67 20%, HER-2 negative (ratio 1.33)   12/16/2013 Initial Biopsy   RIGHT breast needle core bx: intraductal papilloma.   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    01/27/2014 Genetic Testing   PALB2 c.2257C>T pathogenic mutation found on Ova[employer] panel testing performed by [employer]  SMARCA4 c.4629+5C>T VUS was also identified. Additional genes tested that were negative include: ATM, BARD1, BRCA1, BRCA2, BRIP1, CDH1, CHEK2, EPCAM, MLH1, MRE11A, MSH2, MSH6, MUTYH, NBN, NF1, PMS2, PTEN, RAD50, RAD51C, RAD51D, SMARCA4, STK11, and TP53. Report date was January 27, 2014.  UPDATE: SMARCA4 c.4629+5C>T VUS has been reclassified to Benign.  The amended report date is October 21, 2022.   04/19/2014 Definitive Surgery   Left lumpectomy Azucena): IDC grade 2, 1.5 cm, intermediate grade DCIS, 0/2 lymph nodes, ER 90%, PR 90%, HER-2 negative ratio 1.33, Ki-67 20%,     04/19/2014 Oncotype testing   Recurrence score 23 (15% ROR). No chemotherapy Daphane).   04/19/2014 Pathologic Stage   LEFT lumpectomy with SLNB Azucena): Invasive ductal carcinoma, grade 2/3, 1.5 cm, DCIS intermediate grade.  2 LN negative for malignancy (0/2). RIGHT lumpectomy: fibrocystic changes   06/08/2014 - 07/28/2014 Radiation Therapy   Adjuvant RT completed Genevia).  Left breast 45 Gy over 25 fractions. Left breast boost 18 cGy over 9 fractions.  Total dose: 60 Gy.    Anti-estrogen oral therapy   Tamoxifen  20 mg daily (Kalika Smay).  Planned duration of therapy 10 years.   11/17/2014 Survivorship   Survivorship visit completed and copy of survivorship care plan provided to patient.   01/12/2015 - 01/15/2015 Hospital Admission    acute pulmonary embolism , on xarelto    06/18/2023 Relapse/Recurrence    Mammogram detected 1.7 cm mass left areola and 0.7 cm retroareolar mass and 1.2 cm retroareolar left breast mass: Biopsy: Grade 2 IDC, no LVI, ER 20%, PR 70%, Ki-67 1%, HER2 1+ negative   09/24/2023 Surgery   Bilateral mastectomies: Right mastectomy: 2 residual intraductal papillomas 5 mm and 3 mm, left mastectomy: 2 foci of invasive cancer 2.7 cm, margins negative, 0/2 lymph nodes negative ER 80%, PR 80%, Ki67 1%, HER2 2+ by IHC, FISH negative ratio 0.86   PALB2-related breast cancer (HCC)  02/01/2014 Initial Diagnosis   PALB2-related breast cancer     CHIEF COMPLIANT:   HISTORY OF PRESENT ILLNESS:  History of Present Illness Janice Crawford is a 48  year old female with a history of blood clots who presents for discussion of hormone therapy options.  She has experienced blood clots in her left leg and lung, which contraindicates the use of tamoxifen  due to increased clotting risk. Her menstrual  cycles are regular, indicating she is not in menopause. She is experiencing emotional challenges and is taking Effexor , which may take three weeks to be effective. She is currently unemployed and has transportation challenges.     ALLERGIES:  is allergic to latex and tomato.  MEDICATIONS:  Current Outpatient Medications  Medication Sig Dispense Refill   acetaminophen  (TYLENOL ) 500 MG tablet Take 2 tablets (1,000 mg total) by mouth every 6 (six) hours. 30 tablet 0   gabapentin  (NEURONTIN ) 100 MG capsule Take 1 capsule (100 mg total) by mouth 2 (two) times daily. 30 capsule 1   ibuprofen  (ADVIL ) 600 MG tablet Take 1 tablet (600 mg total) by mouth every 8 (eight) hours as needed for mild pain (pain score 1-3). Take with food 90 tablet 1   methocarbamol  (ROBAXIN ) 500 MG tablet Take 1 tablet (500 mg total) by mouth every 6 (six) hours as needed for muscle spasms. 20 tablet 2   ondansetron  (ZOFRAN -ODT) 4 MG disintegrating tablet Take 1 tablet (4 mg total) by mouth every 6 (six) hours as needed for nausea. 20 tablet 0   oxyCODONE  (OXY IR/ROXICODONE ) 5 MG immediate release tablet Take 1-2 tablets (5-10 mg total) by mouth every 4 (four) hours as needed for moderate pain (pain score 4-6). 30 tablet 0   senna (SENOKOT) 8.6 MG TABS tablet Take 1 tablet (8.6 mg total) by mouth 2 (two) times daily. 120 tablet 0   No current facility-administered medications for this visit.    PHYSICAL EXAMINATION: ECOG PERFORMANCE STATUS: 1 - Symptomatic but completely ambulatory  There were no vitals filed for this visit. There were no vitals filed for this visit.  Physical Exam   (exam performed in the presence of a chaperone)  LABORATORY DATA:  I have reviewed the data as listed    Latest Ref Rng & Units 07/13/2018   10:59 AM 09/10/2017    7:53 PM 08/26/2015    5:36 AM  CMP  Glucose 70 - 99 mg/dL 99  891  97   BUN 6 - 20 mg/dL 16  10  8    Creatinine 0.44 - 1.00 mg/dL 9.14  9.10  9.20   Sodium 135 - 145  mmol/L 137  138  135   Potassium 3.5 - 5.1 mmol/L 3.7  3.5  3.3   Chloride 98 - 111 mmol/L 103  102  103   CO2 22 - 32 mmol/L 26  28  24    Calcium 8.9 - 10.3 mg/dL 8.7  9.3  8.7   Total Protein 6.5 - 8.1 g/dL 7.7  7.4  6.6   Total Bilirubin 0.3 - 1.2 mg/dL 0.4  0.5  0.7   Alkaline Phos 38 - 126 U/L 55  54  43   AST 15 - 41 U/L 14  16  15    ALT 0 - 44 U/L 13  12  11      Lab Results  Component Value Date   WBC 7.4 07/13/2018   HGB 11.5 (L) 07/13/2018   HCT 35.3 (L) 07/13/2018   MCV 89.8 07/13/2018   PLT 311 07/13/2018   NEUTROABS 4.3 07/13/2018    ASSESSMENT & PLAN:  Cancer of lower-inner quadrant of left female breast (HCC) Left lumpectomy 04/19/2014: IDC grade  2, 1.5 cm, intermediate grade DCIS, 0/2 lymph nodes, ER 90%, PR 90%, HER-2 negative ratio 1.33, Ki-67 20%,  Oncotype DX recurrence score 23, 15% ROR Right lumpectomy: Fibrocystic changes no malignancy. Completed adjuvant radiation 06/08/2014 to 07/28/2014 (Left breast   4500 cGy in 25 sessions, left breast boost 1800 cGy in 9 sessions), started tamoxifen  in June 2016   PALB2 mutation  Relapse/recurrence: 06/18/2023:  Mammogram detected 1.7 cm mass left areola and 0.7 cm retroareolar mass and 1.2 cm retroareolar left breast mass: Biopsy: Grade 2 IDC, no LVI, ER 20%, PR 70%, Ki-67 1%, HER2 1+ negative   Treatment plan: CT chest abdomen pelvis and bone scan for staging 07/24/2023: Benign 09/24/2023: Bilateral mastectomies: Right mastectomy: 2 residual intraductal papillomas 5 mm and 3 mm, negative for malignancy; left mastectomy: 2 foci of invasive carcinoma with DCIS, 0/2 lymph nodes negative, Tamoxifen  is contraindicated because of her previous history of blood clots.  The alternative to this would be ovarian function suppression and aromatase inhibitor therapy.  She wishes to do a oophorectomy instead.  I sent a referral to GYN oncology to discuss this.  She would like to do this before her right breast  reconstruction.  Telephone visit in 2 months to discuss antiestrogen therapy  Major depression: I sent a prescription for Effexor  She has multiple social issues limiting her ability to be on top of her health.  She lives currently in [place].      No orders of the defined types were placed in this encounter.  The patient has a good understanding of the overall plan. she agrees with it. she will call with any problems that may develop before the next visit here. Total time spent: 30 mins including face to face time and time spent for planning, charting and co-ordination of care   Naomi MARLA Chad, MD 11/09/23

## 2023-11-09 NOTE — Assessment & Plan Note (Signed)
 Left lumpectomy 04/19/2014: IDC grade 2, 1.5 cm, intermediate grade DCIS, 0/2 lymph nodes, ER 90%, PR 90%, HER-2 negative ratio 1.33, Ki-67 20%,  Oncotype DX recurrence score 23, 15% ROR Right lumpectomy: Fibrocystic changes no malignancy. Completed adjuvant radiation 06/08/2014 to 07/28/2014 (Left breast   4500 cGy in 25 sessions, left breast boost 1800 cGy in 9 sessions), started tamoxifen  in June 2016     Relapse/recurrence: 06/18/2023:  Mammogram detected 1.7 cm mass left areola and 0.7 cm retroareolar mass and 1.2 cm retroareolar left breast mass: Biopsy: Grade 2 IDC, no LVI, ER 20%, PR 70%, Ki-67 1%, HER2 1+ negative   Treatment plan: CT chest abdomen pelvis and bone scan for staging 07/24/2023: Benign Bilateral mastectomies: Right mastectomy: 2 residual intraductal papillomas 5 mm and 3 mm, negative for malignancy; left mastectomy: 2 foci of invasive carcinoma with DCIS, 0/2 lymph nodes negative, Adjuvant antiestrogen therapy with tamoxifen  1 mg daily x 10 years  She has multiple social issues limiting her ability to be on top of her health.  She lives currently in [place].

## 2023-11-10 ENCOUNTER — Encounter: Payer: Self-pay | Admitting: *Deleted

## 2023-11-10 DIAGNOSIS — C50312 Malignant neoplasm of lower-inner quadrant of left female breast: Secondary | ICD-10-CM

## 2023-11-11 ENCOUNTER — Telehealth: Payer: Self-pay

## 2023-11-11 NOTE — Telephone Encounter (Signed)
 LVM for pt regarding her FMLA forms being completed, faxed, and confirmation received.No Questions or concerns to be noted at this time.

## 2023-11-11 NOTE — Telephone Encounter (Signed)
 LVM for Ms.Novinger to call the office regarding a new patient referral we received from Dr.Gudena. Next available is 10/7 with Dr. Rogelio (30 min new pt)

## 2023-11-12 NOTE — Telephone Encounter (Signed)
 Spoke with Janice Crawford regarding her referral to GYN oncology. She has an appointment scheduled with Dr. Rogelio on 12/01/23 at 1:15. Patient agrees to date and time. She has been provided with office address and location. She is also aware of our mask and visitor policy. Patient verbalized understanding and will call with any questions.

## 2023-11-16 DIAGNOSIS — C50112 Malignant neoplasm of central portion of left female breast: Secondary | ICD-10-CM | POA: Diagnosis not present

## 2023-11-16 DIAGNOSIS — Z170 Estrogen receptor positive status [ER+]: Secondary | ICD-10-CM | POA: Diagnosis not present

## 2023-11-18 ENCOUNTER — Telehealth: Payer: Self-pay

## 2023-11-18 NOTE — Telephone Encounter (Signed)
 CSW Intern left VM to schedule counseling appointment.

## 2023-11-25 ENCOUNTER — Telehealth: Payer: Self-pay

## 2023-11-25 NOTE — Telephone Encounter (Signed)
 CSW Intern left VM to schedule counseling appointment.

## 2023-12-01 ENCOUNTER — Inpatient Hospital Stay

## 2023-12-01 ENCOUNTER — Inpatient Hospital Stay: Attending: Hematology and Oncology | Admitting: Obstetrics & Gynecology

## 2023-12-01 VITALS — BP 138/88 | HR 88 | Temp 98.6°F | Resp 20 | Ht 64.0 in | Wt 249.2 lb

## 2023-12-01 DIAGNOSIS — Z86711 Personal history of pulmonary embolism: Secondary | ICD-10-CM | POA: Insufficient documentation

## 2023-12-01 DIAGNOSIS — Z1505 Genetic susceptibility to malignant neoplasm of fallopian tube(s): Secondary | ICD-10-CM | POA: Diagnosis present

## 2023-12-01 DIAGNOSIS — C50012 Malignant neoplasm of nipple and areola, left female breast: Secondary | ICD-10-CM | POA: Diagnosis not present

## 2023-12-01 DIAGNOSIS — Z170 Estrogen receptor positive status [ER+]: Secondary | ICD-10-CM | POA: Insufficient documentation

## 2023-12-01 DIAGNOSIS — R7303 Prediabetes: Secondary | ICD-10-CM | POA: Insufficient documentation

## 2023-12-01 DIAGNOSIS — M79605 Pain in left leg: Secondary | ICD-10-CM | POA: Diagnosis not present

## 2023-12-01 DIAGNOSIS — C50919 Malignant neoplasm of unspecified site of unspecified female breast: Secondary | ICD-10-CM

## 2023-12-01 DIAGNOSIS — Z1502 Genetic susceptibility to malignant neoplasm of ovary: Secondary | ICD-10-CM | POA: Insufficient documentation

## 2023-12-01 DIAGNOSIS — Z86718 Personal history of other venous thrombosis and embolism: Secondary | ICD-10-CM | POA: Diagnosis not present

## 2023-12-01 LAB — CBC (CANCER CENTER ONLY)
HCT: 35.8 % — ABNORMAL LOW (ref 36.0–46.0)
Hemoglobin: 11.9 g/dL — ABNORMAL LOW (ref 12.0–15.0)
MCH: 28.7 pg (ref 26.0–34.0)
MCHC: 33.2 g/dL (ref 30.0–36.0)
MCV: 86.3 fL (ref 80.0–100.0)
Platelet Count: 327 K/uL (ref 150–400)
RBC: 4.15 MIL/uL (ref 3.87–5.11)
RDW: 13.3 % (ref 11.5–15.5)
WBC Count: 7.4 K/uL (ref 4.0–10.5)
nRBC: 0 % (ref 0.0–0.2)

## 2023-12-01 LAB — HEMOGLOBIN A1C
Hgb A1c MFr Bld: 5.8 % — ABNORMAL HIGH (ref 4.8–5.6)
Mean Plasma Glucose: 119.76 mg/dL

## 2023-12-01 NOTE — Progress Notes (Unsigned)
 Follow Up Note: Gyn-Onc  Janice Crawford 48 y.o. female  CC: PALB2-related breast cancer   HPI:  Seen at the request of Odean Potts, MD. She was previously diagnosed w/a PALB2-related breast cancer.  H/O VTE including PE.  She is premenopausal and is taking Effexor  for vasomotor sxs.  She presents for consultation for possible oophorectomy for ovarian function suppression.   Discussed the use of AI scribe software for clinical note transcription with the patient, who gave verbal consent to proceed. She has a history of prediabetes, three C-sections and a tubal ligation. She also endorses new onset LLE pain--posterior thigh.    Review of Systems  Review of Systems  Constitutional:  Negative for malaise/fatigue and weight loss.  Respiratory:  Negative for shortness of breath and wheezing.   Cardiovascular:  Negative for chest pain and leg swelling.  Gastrointestinal:  Negative for abdominal pain, blood in stool, constipation, nausea and vomiting.  Genitourinary:  Negative for dysuria, frequency, hematuria and urgency.  Musculoskeletal:  See above Neurological:  Negative for weakness.  Psychiatric/Behavioral:  Negative for depression. The patient does not have insomnia.    Current medications, allergy, social history, past surgical history, past medical history, family history were all reviewed.    Vitals:  BP 138/88 Comment: manual recheck  Pulse 88   Temp 98.6 F (37 C) (Oral)   Resp 20   Ht 5' 4 (1.626 m)   Wt 249 lb 3.2 oz (113 kg)   SpO2 94%   BMI 42.78 kg/m    Physical Exam:  Physical Exam Exam conducted with a chaperone present.  Constitutional:      General: She is not in acute distress. Cardiovascular:     Rate and Rhythm: Normal rate and regular rhythm.  Pulmonary:     Effort: Pulmonary effort is normal.     Breath sounds: Normal breath sounds. No wheezing or rhonchi.  Abdominal:     Palpations: Abdomen is soft.     Tenderness: There is no abdominal  tenderness. There is no right CVA tenderness or left CVA tenderness.     Hernia: No hernia is present.  Genitourinary:    General: Normal vulva.     Urethra: No urethral lesion.     Vagina: No lesions. No bleeding Musculoskeletal:     Cervical back: Neck supple.     Right lower leg: No edema.     Left lower leg: No edema. NT. Lymphadenopathy:     Upper Body:     Right upper body: No supraclavicular adenopathy.     Left upper body: No supraclavicular adenopathy.     Lower Body: No right inguinal adenopathy. No left inguinal adenopathy.  Skin:    Findings: No rash.  Neurological:     Mental Status: She is oriented to person, place, and time.     Assessment: The patient is a 48 y.o.  year old, perimenopausal with a PALB2-related breast cancer. She is felt to be a candidate for surgical ovarian suppression, We also discussed the updated NCCN guidelines for consideration of RRSO above age 11 years, Her history is also remarkable for VTE, including a PE w/a negative thrombophilia work up. New onset LE--r/o recurrent DVT. The planned procedure is robotic-assisted BSO in the absence of a confirmed, recurrent DVT.  Verbal consent was obtained for these procedures.  Plan: Review the duplex doppler, TVUS and lab results including CA 125.  We reviewed the patient's specific anatomic and functional findings with her, with the assistance  of diagrams and handouts.  We reviewed the treatment options including expectant, conservative, medical, and surgical management and she is opting for surgical management.  We reviewed the benefits and risks of each treatment option.   For all procedures, we discussed risks of bleeding, infection, damage to surrounding organs including bowel, bladder, blood vessels, ureters and nerves, need for further surgery numbness and weakness at any body site and the rarer risks of blood clot, heart attack, pneumonia, death.   We also discussed the possible conversion to  laparoscopy or laparotomy.  All her questions were answered and she verbalized understanding.    Pre-operative instructions: Combination VTE prophylaxis was ordered as indicated.   Post-operative instructions:  Recommend extended prophylactic pharmacologic VTE prophylaxis for 10 - 14d.  She was given a set of handouts with specific post-operative instructions, including precautions and signs/symptoms for which we would recommend contacting us .   Laboratory testing:   Orders Placed This Encounter  Procedures   US  PELVIC COMPLETE WITH TRANSVAGINAL    Standing Status:   Future    Expiration Date:   11/30/2024    Reason for Exam (SYMPTOM  OR DIAGNOSIS REQUIRED):   PALB 2 carrier    Preferred imaging location?:   [facility]   CA 125    Standing Status:   Future    Number of Occurrences:   1    Expiration Date:   11/30/2024   Hemoglobin A1c    Standing Status:   Future    Number of Occurrences:   1    Expiration Date:   11/30/2024   CBC (Cancer Center Only)    Standing Status:   Future    Number of Occurrences:   1    Expiration Date:   11/30/2024     Preoperative clearance/medical evaluation:  She does not require surgical clearance/medical evaluation.    Post-operative follow-up:  A post-operative appointment will be made for 2 weeks following the procedure  I personally spent 30 minutes face-to-face and non-face-to-face in the care of this patient, which includes all pre, intra, and post visit time on the date of service.    Olam Mill, MD

## 2023-12-01 NOTE — Patient Instructions (Signed)
 Plan on having lab work today and you will be contacted with the results.   We will also plan for a doppler of the lower extremity with pain to check for a blood clot.   Dr. Rogelio has also ordered an ultrasound to look at your gyn organs more closely.  We will look at dates for surgery and contact you with date options. If there is a clot found in your leg, the plan for surgery will need to change.   Preparing for your Surgery  Plan for surgery with Dr. Olam Rogelio at [facility]. You will be scheduled for robotic assisted laparoscopic bilateral salpingo-oophorectomy (removal of the ovaries and fallopian tubes).   Given your history of a clot, Dr. Rogelio is recommending DVT prophylaxis for 2 weeks after surgery. This will be a blood thinner to prevent clots. It can be given in pill or injection form.  Pre-operative Testing -Once a date has been selected, you will receive a phone call from presurgical testing at [facility] to arrange for a pre-operative appointment and lab work.  -Bring your insurance card, copy of an advanced directive if applicable, medication list  -At that visit, you will be asked to sign a consent for a possible blood transfusion in case a transfusion becomes necessary during surgery.  The need for a blood transfusion is rare but having consent is a necessary part of your care.     -You should not be taking blood thinners or aspirin at least ten days prior to surgery unless instructed by your surgeon.  -Do not take supplements such as fish oil (omega 3), red yeast rice, turmeric before your surgery. STOP TAKING AT LEAST 10 DAYS BEFORE SURGERY. You want to avoid medications with aspirin in them including headache powders such as BC or Goody's), Excedrin migraine.  -If you are taking a GLP-1 medication/injection such as Ozempic, Mounjaro, [id number], this needs to be held before surgery for at least 7 days before.  Day Before Surgery  at Home -You will be asked to take in a light diet the day before surgery. You will be advised you can have clear liquids up until 3 hours before your surgery.    Eat a light diet the day before surgery.  Examples including soups, broths, toast, yogurt, mashed potatoes.  AVOID GAS PRODUCING FOODS AND BEVERAGES. Things to avoid include carbonated beverages (fizzy beverages, sodas), raw fruits and raw vegetables (uncooked), or beans.   If your bowels are filled with gas, your surgeon will have difficulty visualizing your pelvic organs which increases your surgical risks.  Your role in recovery Your role is to become active as soon as directed by your doctor, while still giving yourself time to heal.  Rest when you feel tired. You will be asked to do the following in order to speed your recovery:  - Cough and breathe deeply. This helps to clear and expand your lungs and can prevent pneumonia after surgery.  - STAY ACTIVE WHEN YOU GET HOME. Do mild physical activity. Walking or moving your legs help your circulation and body functions return to normal. Do not try to get up or walk alone the first time after surgery.   -If you develop swelling on one leg or the other, pain in the back of your leg, redness/warmth in one of your legs, please call the office or go to the Emergency Room to have a doppler to rule out a blood clot. For shortness of breath, chest pain-seek  care in the Emergency Room as soon as possible. - Actively manage your pain. Managing your pain lets you move in comfort. We will ask you to rate your pain on a scale of zero to 10. It is your responsibility to tell your doctor or nurse where and how much you hurt so your pain can be treated.  Special Considerations -If you are diabetic, you may be placed on insulin after surgery to have closer control over your blood sugars to promote healing and recovery.  This does not mean that you will be discharged on insulin.  If applicable, your oral  antidiabetics will be resumed when you are tolerating a solid diet.  -Your final pathology results from surgery should be available around one week after surgery and the results will be relayed to you when available.  -FMLA forms can be faxed to [phone] and please allow 5-7 business days for completion.  Pain Management After Surgery -You will be prescribed your pain medication and bowel regimen medications before surgery closer to the date so that you can have these available when you are discharged from the hospital. The pain medication is for use ONLY AFTER surgery and a new prescription will not be given.   -Make sure that you have Tylenol  and Ibuprofen  IF YOU ARE ABLE TO TAKE THESE MEDICATIONS at home to use on a regular basis after surgery for pain control. We recommend alternating the medications every hour to six hours since they work differently and are processed in the body differently for pain relief.  -Review the attached handout on narcotic use and their risks and side effects.   Bowel Regimen -You will be prescribed Sennakot-S to take nightly to prevent constipation especially if you are taking the narcotic pain medication intermittently.  It is important to prevent constipation and drink adequate amounts of liquids. You can stop taking this medication when you are not taking pain medication and you are back on your normal bowel routine.  Risks of Surgery Risks of surgery are low but include bleeding, infection, damage to surrounding structures, re-operation, blood clots, and very rarely death.   Blood Transfusion Information (For the consent to be signed before surgery)  We will be checking your blood type before surgery so in case of emergencies, we will know what type of blood you would need.                                            WHAT IS A BLOOD TRANSFUSION?  A transfusion is the replacement of blood or some of its parts. Blood is made up of multiple cells which  provide different functions. Red blood cells carry oxygen and are used for blood loss replacement. White blood cells fight against infection. Platelets control bleeding. Plasma helps clot blood. Other blood products are available for specialized needs, such as hemophilia or other clotting disorders. BEFORE THE TRANSFUSION  Who gives blood for transfusions?  You may be able to donate blood to be used at a later date on yourself (autologous donation). Relatives can be asked to donate blood. This is generally not any safer than if you have received blood from a stranger. The same precautions are taken to ensure safety when a relative's blood is donated. Healthy volunteers who are fully evaluated to make sure their blood is safe. This is blood bank blood. Transfusion therapy is the  safest it has ever been in the practice of medicine. Before blood is taken from a donor, a complete history is taken to make sure that person has no history of diseases nor engages in risky social behavior (examples are intravenous drug use or sexual activity with multiple partners). The donor's travel history is screened to minimize risk of transmitting infections, such as malaria. The donated blood is tested for signs of infectious diseases, such as HIV and hepatitis. The blood is then tested to be sure it is compatible with you in order to minimize the chance of a transfusion reaction. If you or a relative donates blood, this is often done in anticipation of surgery and is not appropriate for emergency situations. It takes many days to process the donated blood. RISKS AND COMPLICATIONS Although transfusion therapy is very safe and saves many lives, the main dangers of transfusion include:  Getting an infectious disease. Developing a transfusion reaction. This is an allergic reaction to something in the blood you were given. Every precaution is taken to prevent this. The decision to have a blood transfusion has been  considered carefully by your caregiver before blood is given. Blood is not given unless the benefits outweigh the risks.  AFTER SURGERY INSTRUCTIONS  Return to work: 4-6 weeks if applicable  Activity: 1. Be up and out of the bed during the day.  Take a nap if needed.  You may walk up steps but be careful and use the hand rail.  Stair climbing will tire you more than you think, you may need to stop part way and rest.   2. No lifting or straining for 6 weeks over 10 pounds. No pushing, pulling, straining for 6 weeks.  3. No driving for 4-89 days when the following criteria have been met: Do not drive if you are taking narcotic pain medicine and make sure that your reaction time has returned.   4. You can shower as soon as the next day after surgery. Shower daily.  Use your regular soap and water (not directly on the incision) and pat your incision(s) dry afterwards; don't rub.  No tub baths or submerging your body in water until cleared by your surgeon. If you have the soap that was given to you by pre-surgical testing that was used before surgery, you do not need to use it afterwards because this can irritate your incisions.   5. No sexual activity and nothing in the vagina for 4-6 weeks.  6. You may experience a small amount of clear drainage from your incisions, which is normal.  If the drainage persists, increases, or changes color please call the office.  7. Do not use creams, lotions, or ointments such as neosporin on your incisions after surgery until advised by your surgeon because they can cause removal of the dermabond glue on your incisions.    8. You may experience vaginal spotting after surgery.  The spotting is normal but if you experience heavy bleeding, call our office.  9. Take Tylenol  or ibuprofen  first for pain if you are able to take these medications and only use narcotic pain medication for severe pain not relieved by the Tylenol  or Ibuprofen .  Monitor your Tylenol  intake  to a max of 4,000 mg in a 24 hour period. You can alternate these medications after surgery.  Diet: 1. Low sodium Heart Healthy Diet is recommended but you are cleared to resume your normal (before surgery) diet after your procedure.  2. It is safe to  use a laxative, such as Miralax or Colace, if you have difficulty moving your bowels before surgery. You have been prescribed Sennakot-S to take at bedtime every evening after surgery to keep bowel movements regular and to prevent constipation.    Wound Care: 1. Keep clean and dry.  Shower daily.  Reasons to call the Doctor: Fever - Oral temperature greater than 100.4 degrees Fahrenheit Foul-smelling vaginal discharge Difficulty urinating Nausea and vomiting Increased pain at the site of the incision that is unrelieved with pain medicine. Difficulty breathing with or without chest pain New calf pain especially if only on one side Sudden, continuing increased vaginal bleeding with or without clots.   Contacts: For questions or concerns you should contact:  Dr. Olam Mill at [phone]  Eleanor Epps, NP at [phone]  After Hours: call [phone] and have the GYN Oncologist paged/contacted (after 5 pm or on the weekends). You will speak with an after hours RN and let he or she know you have had surgery.  Messages sent via mychart are for non-urgent matters and are not responded to after hours so for urgent needs, please call the after hours number.

## 2023-12-02 ENCOUNTER — Encounter: Payer: Self-pay | Admitting: Obstetrics & Gynecology

## 2023-12-02 LAB — CA 125: Cancer Antigen (CA) 125: 8.3 U/mL (ref 0.0–38.1)

## 2023-12-03 ENCOUNTER — Ambulatory Visit (HOSPITAL_COMMUNITY)
Admission: RE | Admit: 2023-12-03 | Discharge: 2023-12-03 | Disposition: A | Discharge status: Home / Self Care | Source: Ambulatory Visit | Attending: Obstetrics & Gynecology | Admitting: Obstetrics & Gynecology

## 2023-12-03 ENCOUNTER — Telehealth: Payer: Self-pay | Admitting: *Deleted

## 2023-12-03 ENCOUNTER — Encounter: Payer: Self-pay | Admitting: Hematology and Oncology

## 2023-12-03 DIAGNOSIS — M79605 Pain in left leg: Secondary | ICD-10-CM | POA: Insufficient documentation

## 2023-12-03 NOTE — Telephone Encounter (Signed)
 Devere from [facility] called and left a message for the office in regards to patient Janice Crawford [birth year] in regards to her left lower venous studies to rule out DVT. Pt's results are negative. Message forwarded to providers.

## 2023-12-04 ENCOUNTER — Ambulatory Visit: Payer: Self-pay

## 2023-12-04 ENCOUNTER — Ambulatory Visit (HOSPITAL_COMMUNITY)
Admission: RE | Admit: 2023-12-04 | Discharge: 2023-12-04 | Disposition: A | Discharge status: Home / Self Care | Source: Ambulatory Visit | Attending: Obstetrics & Gynecology | Admitting: Obstetrics & Gynecology

## 2023-12-04 ENCOUNTER — Telehealth: Payer: Self-pay | Admitting: Oncology

## 2023-12-04 DIAGNOSIS — Z1589 Genetic susceptibility to other disease: Secondary | ICD-10-CM | POA: Diagnosis not present

## 2023-12-04 DIAGNOSIS — C50919 Malignant neoplasm of unspecified site of unspecified female breast: Secondary | ICD-10-CM | POA: Insufficient documentation

## 2023-12-04 DIAGNOSIS — Z1501 Genetic susceptibility to malignant neoplasm of breast: Secondary | ICD-10-CM | POA: Insufficient documentation

## 2023-12-04 DIAGNOSIS — R002 Palpitations: Secondary | ICD-10-CM | POA: Diagnosis not present

## 2023-12-04 NOTE — Telephone Encounter (Signed)
-----   Message from Janice Crawford sent at 12/03/2023  5:12 PM EDT ----- Please let patient know no sign of blood clot in left leg ----- Message ----- From: [facility], [employer] Sent: 12/03/2023   2:25 PM EDT To: Olam Mill, MD

## 2023-12-04 NOTE — Telephone Encounter (Signed)
 LVM for Janice Crawford to call office regarding the doppler results, negative for DVT

## 2023-12-04 NOTE — Telephone Encounter (Signed)
 Called Janice Crawford and let her know that we are looking at surgery on 01/08/2024.  Ozzie said this works for her and would like to schedule.  Advised her that the hospital will be calling to set up a preop appointment for before surgery.    Also let her know that there was no signs of a blood clot in her left leg. She verbalized understanding and agreement.

## 2023-12-06 DIAGNOSIS — Z419 Encounter for procedure for purposes other than remedying health state, unspecified: Secondary | ICD-10-CM | POA: Diagnosis not present

## 2023-12-07 ENCOUNTER — Encounter: Payer: Self-pay | Admitting: Hematology and Oncology

## 2023-12-07 NOTE — Telephone Encounter (Signed)
 2nd attempt in reaching Janice Crawford with doppler results. Negative for DVT

## 2023-12-08 NOTE — Telephone Encounter (Signed)
 Spoke with Ms. Casimir who returned office call. Relayed message from Eleanor Epps, NP that patient's doppler studies show no sign of blood clot in left leg. Pt verbalized understanding and states the pain in her left leg is behind her thigh (hamstring) and is still there.   She reports that she didn't feel the pain yesterday, but today it's back. She denies redness, warmth and swelling. Pt states she may have pulled a muscle while playing with her grand daughter?    Advised patient to alternate heat and ice and continue to monitor and if the pain doesn't go away or she develops increased pain that accompanies swelling, redness or warm to touch to reach out to her PCP or go to urgent care to be evaluated. Pt verbalized understanding and thanked the office for calling.   Pt also asking about her pelvic ultrasound results? Advised they have resulted yet and  once we have the results the office will call back. Pt thanked the office.

## 2023-12-08 NOTE — Telephone Encounter (Signed)
 3rd attempt to reach out to Janice Crawford to relay negative doppler results  Provider aware

## 2023-12-11 ENCOUNTER — Telehealth: Payer: Self-pay | Admitting: *Deleted

## 2023-12-11 NOTE — Telephone Encounter (Signed)
-----   Message from Eleanor JONETTA Epps sent at 12/11/2023 10:05 AM EDT ----- Please call patient and let her know no concerning findings on ultrasound. No masses seen on the ovaries. Proceed as planned with surgery with Dr. JERELD

## 2023-12-11 NOTE — Telephone Encounter (Signed)
 Attempted to reach patient to relay results. Left voicemail requesting call back to [phone].

## 2023-12-11 NOTE — Telephone Encounter (Signed)
 2nd attempt to reach patient to relay results. Left voicemail requesting call back to [phone].

## 2023-12-14 NOTE — Telephone Encounter (Signed)
 3 rd attempt to reach patient to relay results. Left voicemail requesting call back.

## 2023-12-15 ENCOUNTER — Encounter: Payer: Self-pay | Admitting: *Deleted

## 2023-12-31 ENCOUNTER — Telehealth: Payer: Self-pay | Admitting: Adult Health

## 2023-12-31 NOTE — Telephone Encounter (Signed)
 Called pt and she is aware of her appt and time.

## 2024-01-05 NOTE — Patient Instructions (Signed)
 SURGICAL WAITING ROOM VISITATION Patients having surgery or a procedure may have no more than 2 support people in the waiting area - these visitors may rotate.    Children under the age of 43 must have an adult with them who is not the patient.  If the patient needs to stay at the hospital during part of their recovery, the visitor guidelines for inpatient rooms apply. Pre-op nurse will coordinate an appropriate time for 1 support person to accompany patient in pre-op.  This support person may not rotate.    Please refer to the [facility] website for the visitor guidelines for Inpatients (after your surgery is over and you are in a regular room).       Your procedure is scheduled on: 01-08-24   Report to [facility] Main Entrance    Report to admitting at 7:45 AM   Call this number if you have problems the morning of surgery [phone]   Follow a light diet day before surgery (avoid gas producing foods)   Do not eat food :After Midnight.   After Midnight you may have the following liquids until 7:00 AM DAY OF SURGERY  Water Non-Citrus Juices (without pulp, NO RED-Apple, White grape, White cranberry) Black Coffee (NO MILK/CREAM OR CREAMERS, sugar ok)  Clear Tea (NO MILK/CREAM OR CREAMERS, sugar ok) regular and decaf                             Plain Jell-O (NO RED)                                           Fruit ices (not with fruit pulp, NO RED)                                     Popsicles (NO RED)                                                               Sports drinks like Gatorade (NO RED)                       If you have questions, please contact your surgeon's office.   FOLLOW  ANY ADDITIONAL PRE OP INSTRUCTIONS YOU RECEIVED FROM YOUR SURGEON'S OFFICE!!!     Oral Hygiene is also important to reduce your risk of infection.                                    Remember - BRUSH YOUR TEETH THE MORNING OF SURGERY WITH YOUR REGULAR TOOTHPASTE   Do NOT smoke  after Midnight   Take these medicines the morning of surgery with A SIP OF WATER:    Tylenol  if needed   Stop all vitamins and herbal supplements 7 days before surgery                              You  may not have any metal on your body including hair pins, jewelry, and body piercing             Do not wear make-up, lotions, powders, perfumes or deodorant  Do not wear nail polish including gel and S&S, artificial/acrylic nails, or any other type of covering on natural nails including finger and toenails. If you have artificial nails, gel coating, etc. that needs to be removed by a nail salon please have this removed prior to surgery or surgery may need to be canceled/ delayed if the surgeon/ anesthesia feels like they are unable to be safely monitored.   Do not shave  48 hours prior to surgery.    Do not bring valuables to the hospital. [facility] IS NOT RESPONSIBLE   FOR VALUABLES.   Contacts, dentures or bridgework may not be worn into surgery.  DO NOT BRING YOUR HOME MEDICATIONS TO THE HOSPITAL. PHARMACY WILL DISPENSE MEDICATIONS LISTED ON YOUR MEDICATION LIST TO YOU DURING YOUR ADMISSION IN THE HOSPITAL!    Patients discharged on the day of surgery will not be allowed to drive home.  Someone NEEDS to stay with you for the first 24 hours after anesthesia.               Please read over the following fact sheets you were given: IF YOU HAVE QUESTIONS ABOUT YOUR PRE-OP INSTRUCTIONS PLEASE CALL [phone] [facility]  If you received a COVID test during your pre-op visit  it is requested that you wear a mask when out in public, stay away from anyone that may not be feeling well and notify your surgeon if you develop symptoms. If you test positive for Covid or have been in contact with anyone that has tested positive in the last 10 days please notify you surgeon. [facility] - Preparing for Surgery Before surgery, you can play an important role.  Because skin is not sterile, your skin needs to  be as free of germs as possible.  You can reduce the number of germs on your skin by washing with CHG (chlorahexidine gluconate) soap before surgery.  CHG is an antiseptic cleaner which kills germs and bonds with the skin to continue killing germs even after washing. Please DO NOT use if you have an allergy to CHG or antibacterial soaps.  If your skin becomes reddened/irritated stop using the CHG and inform your nurse when you arrive at Short Stay. Do not shave (including legs and underarms) for at least 48 hours prior to the first CHG shower.  You may shave your face/neck.  Please follow these instructions carefully:  1.  Shower with CHG Soap the night before surgery ONLY (DO NOT USE THE SOAP THE MORNING OF SURGERY).  2.  If you choose to wash your hair, wash your hair first as usual with your normal  shampoo.  3.  After you shampoo, rinse your hair and body thoroughly to remove the shampoo.                             4.  Use CHG as you would any other liquid soap.  You can apply chg directly to the skin and wash.  Gently with a scrungie or clean washcloth.  5.  Apply the CHG Soap to your body ONLY FROM THE NECK DOWN.   Do   not use on face/ open  Wound or open sores. Avoid contact with eyes, ears mouth and   genitals (private parts).                       Wash face,  Genitals (private parts) with your normal soap.             6.  Wash thoroughly, paying special attention to the area where your    surgery  will be performed.  7.  Thoroughly rinse your body with warm water from the neck down.  8.  DO NOT shower/wash with your normal soap after using and rinsing off the CHG Soap.                9.  Pat yourself dry with a clean towel.            10.  Wear clean pajamas.            11.  Place clean sheets on your bed the night of your first shower and do not  sleep with pets. Day of Surgery : Do not apply any CHG, lotions/deodorants the morning of surgery.  Please wear  clean clothes to the hospital/surgery center.  FAILURE TO FOLLOW THESE INSTRUCTIONS MAY RESULT IN THE CANCELLATION OF YOUR SURGERY  PATIENT SIGNATURE_________________________________  NURSE SIGNATURE__________________________________  ________________________________________________________________________

## 2024-01-05 NOTE — Progress Notes (Signed)
 Date of COVID positive in last 90 days:  PCP -  Cardiologist -   Chest x-ray - N/A EKG - N/A Stress Test - N/A ECHO - 01-13-15 Epic Cardiac Cath - N/A Pacemaker/ICD device last checked:N/A Spinal Cord Stimulator:N/A  Bowel Prep - N/A  Sleep Study - N/A CPAP -   Prediabetes Fasting Blood Sugar - N/A Checks Blood Sugar _____ times a day  Last dose of GLP1 agonist-  N/A GLP1 instructions:  Do not take after     Last dose of SGLT-2 inhibitors-  N/A SGLT-2 instructions:  Do not take after     Blood Thinner Instructions: N/A Last dose:   Time: Aspirin Instructions:N/A Last Dose:  Activity level:  Can go up a flight of stairs and perform activities of daily living without stopping and without symptoms of chest pain or shortness of breath.  Able to exercise without symptoms  Unable to go up a flight of stairs without symptoms of     Anesthesia review: N/A  Patient denies shortness of breath, fever, cough and chest pain at PAT appointment  Patient verbalized understanding of instructions that were given to them at the PAT appointment. Patient was also instructed that they will need to review over the PAT instructions again at home before surgery.

## 2024-01-06 ENCOUNTER — Encounter (HOSPITAL_COMMUNITY)
Admission: RE | Admit: 2024-01-06 | Discharge: 2024-01-06 | Disposition: A | Discharge status: Home / Self Care | Source: Ambulatory Visit | Attending: Obstetrics & Gynecology | Admitting: Obstetrics & Gynecology

## 2024-01-06 ENCOUNTER — Encounter (HOSPITAL_COMMUNITY): Payer: Self-pay

## 2024-01-06 ENCOUNTER — Other Ambulatory Visit: Payer: Self-pay

## 2024-01-06 VITALS — Ht 64.0 in | Wt 250.0 lb

## 2024-01-06 DIAGNOSIS — I10 Essential (primary) hypertension: Secondary | ICD-10-CM

## 2024-01-06 HISTORY — DX: Anemia, unspecified: D64.9

## 2024-01-06 HISTORY — DX: Anxiety disorder, unspecified: F41.9

## 2024-01-06 HISTORY — DX: Depression, unspecified: F32.A

## 2024-01-06 NOTE — H&P (Signed)
 Follow Up Note: Gyn-Onc   Janice Crawford 48 y.o. female   CC: PALB2-related breast cancer     HPI:  Seen at the request of Odean Potts, MD. She was previously diagnosed w/a PALB2-related breast cancer.  H/O VTE including PE.  She is premenopausal and is taking Effexor  for vasomotor sxs.  She presents for consultation for possible oophorectomy for ovarian function suppression.   Discussed the use of AI scribe software for clinical note transcription with the patient, who gave verbal consent to proceed. She has a history of prediabetes, three C-sections and a tubal ligation.   Review of prior data: TVUS 10/25:  1. Heterogeneous appearance of the myometrium, although no fibroids or other mass lesions are visualized. 2. Otherwise unremarkable pelvic ultrasound  HgbA1c--5.8 CA 125--8.3  Review of Systems  Review of Systems  Constitutional:  Negative for malaise/fatigue and weight loss.  Respiratory:  Negative for shortness of breath and wheezing.   Cardiovascular:  Negative for chest pain and leg swelling.  Gastrointestinal:  Negative for abdominal pain, blood in stool, constipation, nausea and vomiting.  Genitourinary:  Negative for dysuria, frequency, hematuria and urgency.  Musculoskeletal:  See above Neurological:  Negative for weakness.  Psychiatric/Behavioral:  Negative for depression. The patient does not have insomnia.     Current medications, allergy, social history, past surgical history, past medical history, family history were all reviewed.       Vitals:  BP 138/88 Comment: manual recheck  Pulse 88   Temp 98.6 F (37 C) (Oral)   Resp 20   Ht 5' 4 (1.626 m)   Wt 249 lb 3.2 oz (113 kg)   SpO2 94%   BMI 42.78 kg/m     Physical Exam:  Physical Exam Exam conducted with a chaperone present.  Constitutional:      General: She is not in acute distress. Cardiovascular:     Rate and Rhythm: Normal rate and regular rhythm.  Pulmonary:     Effort: Pulmonary  effort is normal.     Breath sounds: Normal breath sounds. No wheezing or rhonchi.  Abdominal:     Palpations: Abdomen is soft.     Tenderness: There is no abdominal tenderness. There is no right CVA tenderness or left CVA tenderness.     Hernia: No hernia is present.  Genitourinary:    General: Normal vulva.     Urethra: No urethral lesion.     Vagina: No lesions. No bleeding Musculoskeletal:     Cervical back: Neck supple.     Right lower leg: No edema.     Left lower leg: No edema. NT. Lymphadenopathy:     Upper Body:     Right upper body: No supraclavicular adenopathy.     Left upper body: No supraclavicular adenopathy.     Lower Body: No right inguinal adenopathy. No left inguinal adenopathy.  Skin:    Findings: No rash.  Neurological:     Mental Status: She is oriented to person, place, and time.        Assessment: The patient is a 48 y.o.  year old, perimenopausal with a PALB2-related breast cancer. She is felt to be a candidate for surgical ovarian suppression, We also discussed the updated NCCN guidelines for consideration of RRSO above age 15 years, Her history is also remarkable for VTE, including a PE w/a negative thrombophilia work up. New onset LE--r/o recurrent DVT. The planned procedure is robotic-assisted BSO in the absence of a confirmed, recurrent DVT.  Verbal consent was obtained for these procedures.   Plan: Review the duplex doppler, TVUS and lab results including CA 125.  We reviewed the patient's specific anatomic and functional findings with her, with the assistance of diagrams and handouts.  We reviewed the treatment options including expectant, conservative, medical, and surgical management and she is opting for surgical management.  We reviewed the benefits and risks of each treatment option.    For all procedures, we discussed risks of bleeding, infection, damage to surrounding organs including bowel, bladder, blood vessels, ureters and nerves, need for  further surgery numbness and weakness at any body site and the rarer risks of blood clot, heart attack, pneumonia, death.   We also discussed the possible conversion to laparoscopy or laparotomy.  All her questions were answered and she verbalized understanding.     Pre-operative instructions: Combination VTE prophylaxis was ordered as indicated.     Post-operative instructions:  Recommend extended prophylactic pharmacologic VTE prophylaxis for 10 - 14d.  She was given a set of handouts with specific post-operative instructions, including precautions and signs/symptoms for which we would recommend contacting us .     Preoperative clearance/medical evaluation:  She does not require surgical clearance/medical evaluation.     Post-operative follow-up:  A post-operative appointment will be made for 2 weeks following the procedure   I personally spent 30 minutes face-to-face and non-face-to-face in the care of this patient, which includes all pre, intra, and post visit time on the date of service.    [place], MD

## 2024-01-07 ENCOUNTER — Encounter (HOSPITAL_COMMUNITY)
Admission: RE | Admit: 2024-01-07 | Discharge: 2024-01-07 | Disposition: A | Discharge status: Home / Self Care | Source: Ambulatory Visit | Attending: Obstetrics & Gynecology | Admitting: Obstetrics & Gynecology

## 2024-01-07 ENCOUNTER — Telehealth: Payer: Self-pay | Admitting: *Deleted

## 2024-01-07 ENCOUNTER — Other Ambulatory Visit: Payer: Self-pay | Admitting: Gynecologic Oncology

## 2024-01-07 DIAGNOSIS — Z1589 Genetic susceptibility to other disease: Secondary | ICD-10-CM | POA: Insufficient documentation

## 2024-01-07 DIAGNOSIS — Z01818 Encounter for other preprocedural examination: Secondary | ICD-10-CM | POA: Diagnosis not present

## 2024-01-07 DIAGNOSIS — C50919 Malignant neoplasm of unspecified site of unspecified female breast: Secondary | ICD-10-CM | POA: Insufficient documentation

## 2024-01-07 DIAGNOSIS — Z01812 Encounter for preprocedural laboratory examination: Secondary | ICD-10-CM | POA: Diagnosis present

## 2024-01-07 DIAGNOSIS — Z01810 Encounter for preprocedural cardiovascular examination: Secondary | ICD-10-CM | POA: Diagnosis present

## 2024-01-07 DIAGNOSIS — Z1501 Genetic susceptibility to malignant neoplasm of breast: Secondary | ICD-10-CM | POA: Diagnosis not present

## 2024-01-07 DIAGNOSIS — I10 Essential (primary) hypertension: Secondary | ICD-10-CM | POA: Diagnosis not present

## 2024-01-07 LAB — COMPREHENSIVE METABOLIC PANEL WITH GFR
ALT: 12 U/L (ref 0–44)
AST: 18 U/L (ref 15–41)
Albumin: 4 g/dL (ref 3.5–5.0)
Alkaline Phosphatase: 61 U/L (ref 38–126)
Anion gap: 10 (ref 5–15)
BUN: 10 mg/dL (ref 6–20)
CO2: 26 mmol/L (ref 22–32)
Calcium: 9 mg/dL (ref 8.9–10.3)
Chloride: 103 mmol/L (ref 98–111)
Creatinine, Ser: 0.73 mg/dL (ref 0.44–1.00)
GFR, Estimated: >60 mL/min (ref >60)
Glucose, Bld: 119 mg/dL — ABNORMAL HIGH (ref 70–99)
Potassium: 3.7 mmol/L (ref 3.5–5.1)
Sodium: 138 mmol/L (ref 135–145)
Total Bilirubin: 0.3 mg/dL (ref 0.0–1.2)
Total Protein: 7.3 g/dL (ref 6.5–8.1)

## 2024-01-07 LAB — CBC
HCT: 35 % — ABNORMAL LOW (ref 36.0–46.0)
Hemoglobin: 11.4 g/dL — ABNORMAL LOW (ref 12.0–15.0)
MCH: 28.9 pg (ref 26.0–34.0)
MCHC: 32.6 g/dL (ref 30.0–36.0)
MCV: 88.8 fL (ref 80.0–100.0)
Platelets: 335 K/uL (ref 150–400)
RBC: 3.94 MIL/uL (ref 3.87–5.11)
RDW: 13.6 % (ref 11.5–15.5)
WBC: 7.4 K/uL (ref 4.0–10.5)
nRBC: 0 % (ref 0.0–0.2)

## 2024-01-07 MED ORDER — APIXABAN 2.5 MG PO TABS: 2.5000 mg | ORAL_TABLET | Freq: Two times a day (BID) | ORAL | 0 refills | Status: AC

## 2024-01-07 MED ORDER — SENNOSIDES-DOCUSATE SODIUM 8.6-50 MG PO TABS: 2.0000 | ORAL_TABLET | Freq: Every day | ORAL | 0 refills | Status: AC

## 2024-01-07 MED ORDER — OXYCODONE HCL 5 MG PO TABS: 5.0000 mg | ORAL_TABLET | ORAL | 0 refills | Status: AC | PRN

## 2024-01-07 NOTE — Telephone Encounter (Signed)
Attempted to reach patient for pre-op call. Left voicemail requesting call back to [phone].

## 2024-01-07 NOTE — Telephone Encounter (Signed)
 Telephone call to check on pre-operative status.  Patient compliant with pre-operative instructions.  Reinforced nothing to eat after midnight. Clear liquids until 0645. Patient to arrive at 0745. Pt is aware of post op medications that have been sent in to her pharmacy,  No questions or concerns voiced.  Instructed to call for any needs.

## 2024-01-07 NOTE — Telephone Encounter (Signed)
 2nd attempt to reach patient for pre-op call. Left voicemail requesting call back to [phone].

## 2024-01-08 ENCOUNTER — Telehealth: Payer: Self-pay | Admitting: *Deleted

## 2024-01-08 ENCOUNTER — Encounter (HOSPITAL_COMMUNITY): Payer: Self-pay | Admitting: Obstetrics & Gynecology

## 2024-01-08 DIAGNOSIS — C50919 Malignant neoplasm of unspecified site of unspecified female breast: Secondary | ICD-10-CM

## 2024-01-08 LAB — TYPE AND SCREEN
ABO/RH(D): A POS
Antibody Screen: NEGATIVE

## 2024-01-08 NOTE — Telephone Encounter (Signed)
 Spoke with patient who called the office stating she called the after hours line and left a message. Pt states the person who was suppose to give her a ride this morning overslept and was unable to bring her to the hospital. Advised patient that the office would reach back out with a future date for her surgery and also advised that patient needs to make reliable transportation arrangements and have a back up. Pt verbalized understanding.

## 2024-01-08 NOTE — Telephone Encounter (Signed)
 Attempted to reach patient to relay new surgery date. Left voicemail requesting call back.

## 2024-01-08 NOTE — Telephone Encounter (Signed)
 2nd attempt to reach patient to relay new date for surgery. Left voicemail requesting call back.

## 2024-01-08 NOTE — Telephone Encounter (Signed)
-----   Message from Eleanor JONETTA Epps sent at 01/08/2024  1:21 PM EST ----- Please let her know her surgery has been rescheduled to Dec 10. Janice Crawford we will prob need to adjust the clinic schedule since we have 3 surgeries on that day

## 2024-01-08 NOTE — Telephone Encounter (Signed)
 Attempted to reach patient this morning. Left voicemail requesting to call back due to patient hasn't shown up yet for her scheduled surgery with Dr. Rogelio. Pt's daughter Janice Crawford was also notified and she states she hasn't spoken to her mother this morning and states she will try and reach out and have her call the office at [phone]. Short Stay at [facility] was also called and they also tried to notify patient as well.

## 2024-01-11 ENCOUNTER — Inpatient Hospital Stay: Attending: Hematology and Oncology | Admitting: Hematology and Oncology

## 2024-01-11 DIAGNOSIS — Z170 Estrogen receptor positive status [ER+]: Secondary | ICD-10-CM

## 2024-01-11 DIAGNOSIS — C50312 Malignant neoplasm of lower-inner quadrant of left female breast: Secondary | ICD-10-CM

## 2024-01-11 NOTE — Assessment & Plan Note (Signed)
 Left lumpectomy 04/19/2014: IDC grade 2, 1.5 cm, intermediate grade DCIS, 0/2 lymph nodes, ER 90%, PR 90%, HER-2 negative ratio 1.33, Ki-67 20%,  Oncotype DX recurrence score 23, 15% ROR Right lumpectomy: Fibrocystic changes no malignancy. Completed adjuvant radiation 06/08/2014 to 07/28/2014 (Left breast   4500 cGy in 25 sessions, left breast boost 1800 cGy in 9 sessions), started tamoxifen  in June 2016   PALB2 mutation   Relapse/recurrence: 06/18/2023:  Mammogram detected 1.7 cm mass left areola and 0.7 cm retroareolar mass and 1.2 cm retroareolar left breast mass: Biopsy: Grade 2 IDC, no LVI, ER 20%, PR 70%, Ki-67 1%, HER2 1+ negative   Treatment plan: CT chest abdomen pelvis and bone scan for staging 07/24/2023: Benign 09/24/2023: Bilateral mastectomies: Right mastectomy: 2 residual intraductal papillomas 5 mm and 3 mm, negative for malignancy; left mastectomy: 2 foci of invasive carcinoma with DCIS, 0/2 lymph nodes negative, Tamoxifen  is contraindicated because of her previous history of blood clots.  The alternative to this would be ovarian function suppression and aromatase inhibitor therapy.  She wishes to do a oophorectomy instead.   After oophorectomy is complete then we will start her on anastrozole therapy. Major depression currently on Effexor  Multiple social issues.  Lives in [place].

## 2024-01-11 NOTE — Telephone Encounter (Signed)
 Patient called and confirmed her surgery date for 12/10 message forwarded to [facility] APP

## 2024-01-11 NOTE — Progress Notes (Signed)
 I called the patient twice and left voicemails.

## 2024-01-27 NOTE — Progress Notes (Signed)
 Updated date of surgery: 02/03/24  Updated time of arrival: 11 AM  Patient will be discharged from hospital and monitored at home for 24 hours by: Daughter  Patient denies any changes in allergies, medications, medical history  Pre op instructions reviewed, follow up questions addressed and patient verbalized understanding at this time.

## 2024-02-01 ENCOUNTER — Encounter: Payer: Self-pay | Admitting: Adult Health

## 2024-02-01 ENCOUNTER — Encounter: Payer: Self-pay | Admitting: Hematology and Oncology

## 2024-02-01 ENCOUNTER — Telehealth: Payer: Self-pay | Admitting: *Deleted

## 2024-02-01 NOTE — Telephone Encounter (Signed)
 It was brought our attention that [name] needed paperwork filled out for her employer prior to her surgery on 12/10. Called the patient and left the phone number of the gyn/onc clinic and asked for her to return our call.

## 2024-02-01 NOTE — H&P (Signed)
 Follow Up Note: Gyn-Onc   Janice Crawford 48 y.o. female   CC: PALB2-related breast cancer     HPI:  Seen at the request of Odean Potts, MD. She was previously diagnosed w/a PALB2-related breast cancer.  H/O VTE including PE.  She is premenopausal and is taking Effexor  for vasomotor sxs.  She presents for consultation for possible oophorectomy for ovarian function suppression.   Discussed the use of AI scribe software for clinical note transcription with the patient, who gave verbal consent to proceed. She has a history of prediabetes, three C-sections and a tubal ligation.    Review of prior data: TVUS 10/25:  1. Heterogeneous appearance of the myometrium, although no fibroids or other mass lesions are visualized. 2. Otherwise unremarkable pelvic ultrasound  HgbA1c--5.8 CA 125--8.3   Review of Systems  Review of Systems  Constitutional:  Negative for malaise/fatigue and weight loss.  Respiratory:  Negative for shortness of breath and wheezing.   Cardiovascular:  Negative for chest pain and leg swelling.  Gastrointestinal:  Negative for abdominal pain, blood in stool, constipation, nausea and vomiting.  Genitourinary:  Negative for dysuria, frequency, hematuria and urgency.  Musculoskeletal:  See above Neurological:  Negative for weakness.  Psychiatric/Behavioral:  Negative for depression. The patient does not have insomnia.     Current medications, allergy, social history, past surgical history, past medical history, family history were all reviewed.       Vitals:  BP 138/88 Comment: manual recheck  Pulse 88   Temp 98.6 F (37 C) (Oral)   Resp 20   Ht 5' 4 (1.626 m)   Wt 249 lb 3.2 oz (113 kg)   SpO2 94%   BMI 42.78 kg/m     Physical Exam:  Physical Exam Exam conducted with a chaperone present.  Constitutional:      General: She is not in acute distress. Cardiovascular:     Rate and Rhythm: Normal rate and regular rhythm.  Pulmonary:     Effort: Pulmonary  effort is normal.     Breath sounds: Normal breath sounds. No wheezing or rhonchi.  Abdominal:     Palpations: Abdomen is soft.     Tenderness: There is no abdominal tenderness. There is no right CVA tenderness or left CVA tenderness.     Hernia: No hernia is present.  Genitourinary:    General: Normal vulva.     Urethra: No urethral lesion.     Vagina: No lesions. No bleeding Musculoskeletal:     Cervical back: Neck supple.     Right lower leg: No edema.     Left lower leg: No edema. NT. Lymphadenopathy:     Upper Body:     Right upper body: No supraclavicular adenopathy.     Left upper body: No supraclavicular adenopathy.     Lower Body: No right inguinal adenopathy. No left inguinal adenopathy.  Skin:    Findings: No rash.  Neurological:     Mental Status: She is oriented to person, place, and time.        Assessment: The patient is a 48 y.o.  year old, perimenopausal with a PALB2-related breast cancer. She is felt to be a candidate for surgical ovarian suppression, We also discussed the updated NCCN guidelines for consideration of RRSO above age 47 years, Her history is also remarkable for VTE, including a PE w/a negative thrombophilia work up.  The planned procedure is robotic-assisted BSO in the absence of a confirmed, recurrent DVT.  Verbal consent  was obtained for these procedures.   Plan: We reviewed the patient's specific anatomic and functional findings with her, with the assistance of diagrams and handouts.  We reviewed the treatment options including expectant, conservative, medical, and surgical management and she is opting for surgical management.  We reviewed the benefits and risks of each treatment option.    For all procedures, we discussed risks of bleeding, infection, damage to surrounding organs including bowel, bladder, blood vessels, ureters and nerves, need for further surgery numbness and weakness at any body site and the rarer risks of blood clot, heart  attack, pneumonia, death.   We also discussed the possible conversion to laparoscopy or laparotomy.  All her questions were answered and she verbalized understanding.     Pre-operative instructions: Combination VTE prophylaxis was ordered as indicated.     Post-operative instructions:  Recommend extended prophylactic pharmacologic VTE prophylaxis for 10 - 14d.  She was given a set of handouts with specific post-operative instructions, including precautions and signs/symptoms for which we would recommend contacting us .     Preoperative clearance/medical evaluation:  She does not require surgical clearance/medical evaluation.     Post-operative follow-up:  A post-operative appointment will be made for 2 weeks following the procedure

## 2024-02-02 ENCOUNTER — Telehealth: Payer: Self-pay | Admitting: *Deleted

## 2024-02-02 ENCOUNTER — Telehealth: Payer: Self-pay

## 2024-02-02 NOTE — Anesthesia Preprocedure Evaluation (Signed)
 Anesthesia Evaluation    Reviewed: Allergy & Precautions, Patient's Chart, lab work & pertinent test results  History of Anesthesia Complications Negative for: history of anesthetic complications  Airway Mallampati: II  TM Distance: >3 FB Neck ROM: Full    Dental no notable dental hx. (+) Teeth Intact, Dental Advisory Given   Pulmonary former smoker, PE (hx 10 years ago)          Cardiovascular Exercise Tolerance: Good hypertension, (-) angina (-) Past MI      Neuro/Psych  Headaches  negative psych ROS   GI/Hepatic ,GERD  ,,  Endo/Other    Class 3 obesity  Renal/GU Lab Results      Component                Value               Date                        K                        3.7                 01/07/2024                CO2                      26                  01/07/2024                BUN                      10                  01/07/2024                CREATININE               0.73                01/07/2024                GFRNONAA                 >60                 01/07/2024                    Musculoskeletal  (+) Arthritis ,    Abdominal   Peds  Hematology Lab Results      Component                Value               Date                      WBC                      7.4                 01/07/2024                HGB                      11.4 (L)  01/07/2024                HCT                      35.0 (L)            01/07/2024                MCV                      88.8                01/07/2024                PLT                      335                 01/07/2024              Anesthesia Other Findings All: latex  Reproductive/Obstetrics                              Anesthesia Physical Anesthesia Plan  ASA: 3  Anesthesia Plan: General   Post-op Pain Management: Tylenol  PO (pre-op)*, Precedex and Toradol  IV (intra-op)*   Induction: Intravenous  PONV  Risk Score and Plan: 4 or greater and Treatment may vary due to age or medical condition, Midazolam , Propofol  infusion, TIVA and Ondansetron   Airway Management Planned: Oral ETT  Additional Equipment: None  Intra-op Plan:   Post-operative Plan: Extubation in OR  Informed Consent:      Dental advisory given  Plan Discussed with:   Anesthesia Plan Comments:          Anesthesia Quick Evaluation

## 2024-02-02 NOTE — Telephone Encounter (Signed)
LVM for pt to return my phone call.

## 2024-02-02 NOTE — Telephone Encounter (Signed)
 01/02/2024: Received voicemail from Janice Crawford transferred from collaborative.  Pt. Provided name, requesting return call to [phone].  Calling about papers I was told were sent about three months ago.  Employer reached out to me stating [name] did not receive this and I need to return to work this week.  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  Surgery on Wednesday and I cannot work having a mastectomy.  Someone told me three months ago to take ten days.  I've called and need someone to call and explain.  02/01/2024 message left requesting return call and again 02/02/2024.

## 2024-02-02 NOTE — Telephone Encounter (Signed)
 Telephone call to check on pre-operative status.  Patient compliant with pre-operative instructions.  Reinforced nothing to eat after midnight. Clear liquids until 1000. Patient to arrive at 1100. Pt is asking about medical records that need to be sent to her employer? Advised patient if the paper work is for [facility], then she needs to contact her employer and have the paperwork faxed to our office [phone] and we can fill them out and send them back.  Pt verbalized understanding and thanked the office. Pt states she will contact her employer today.

## 2024-02-02 NOTE — Telephone Encounter (Signed)
 Attempted to reach patient for pre-op call. Left voicemail on patient's home number as well as her cell number listed on her chart requesting a call back to [phone].

## 2024-02-03 ENCOUNTER — Encounter (HOSPITAL_COMMUNITY): Admission: RE | Payer: Self-pay | Source: Home / Self Care

## 2024-02-03 ENCOUNTER — Encounter (HOSPITAL_COMMUNITY): Payer: Self-pay | Admitting: Physician Assistant

## 2024-02-03 ENCOUNTER — Ambulatory Visit (HOSPITAL_COMMUNITY): Admission: RE | Admit: 2024-02-03 | Source: Home / Self Care | Admitting: Obstetrics & Gynecology

## 2024-02-03 ENCOUNTER — Encounter (HOSPITAL_COMMUNITY): Payer: Self-pay | Admitting: Anesthesiology

## 2024-02-03 ENCOUNTER — Telehealth: Payer: Self-pay | Admitting: *Deleted

## 2024-02-03 DIAGNOSIS — Z01818 Encounter for other preprocedural examination: Secondary | ICD-10-CM

## 2024-02-03 DIAGNOSIS — C50919 Malignant neoplasm of unspecified site of unspecified female breast: Secondary | ICD-10-CM

## 2024-02-03 SURGERY — SALPINGO-OOPHORECTOMY, BILATERAL, ROBOT-ASSISTED
Anesthesia: General | Laterality: Bilateral

## 2024-02-03 NOTE — Discharge Instructions (Signed)
 AFTER SURGERY INSTRUCTIONS   Return to work: 4-6 weeks if applicable   Activity: 1. Be up and out of the bed during the day.  Take a nap if needed.  You may walk up steps but be careful and use the hand rail.  Stair climbing will tire you more than you think, you may need to stop part way and rest.    2. No lifting or straining for 6 weeks over 10 pounds. No pushing, pulling, straining for 6 weeks.   3. No driving for 1-61 days when the following criteria have been met: Do not drive if you are taking narcotic pain medicine and make sure that your reaction time has returned.    4. You can shower as soon as the next day after surgery. Shower daily.  Use your regular soap and water (not directly on the incision) and pat your incision(s) dry afterwards; don't rub.  No tub baths or submerging your body in water until cleared by your surgeon. If you have the soap that was given to you by pre-surgical testing that was used before surgery, you do not need to use it afterwards because this can irritate your incisions.    5. No sexual activity and nothing in the vagina for 4-6 weeks.   6. You may experience a small amount of clear drainage from your incisions, which is normal.  If the drainage persists, increases, or changes color please call the office.   7. Do not use creams, lotions, or ointments such as neosporin on your incisions after surgery until advised by your surgeon because they can cause removal of the dermabond glue on your incisions.     8. You may experience vaginal spotting after surgery.  The spotting is normal but if you experience heavy bleeding, call our office.   9. Take Tylenol  or ibuprofen  first for pain if you are able to take these medications and only use narcotic pain medication for severe pain not relieved by the Tylenol  or Ibuprofen .  Monitor your Tylenol  intake to a max of 4,000 mg in a 24 hour period. You can alternate these medications after surgery.   Diet: 1. Low  sodium Heart Healthy Diet is recommended but you are cleared to resume your normal (before surgery) diet after your procedure.   2. It is safe to use a laxative, such as Miralax  or Colace, if you have difficulty moving your bowels before surgery. You have been prescribed Sennakot-S to take at bedtime every evening after surgery to keep bowel movements regular and to prevent constipation.     Wound Care: 1. Keep clean and dry.  Shower daily.   Reasons to call the Doctor: Fever - Oral temperature greater than 100.4 degrees Fahrenheit Foul-smelling vaginal discharge Difficulty urinating Nausea and vomiting Increased pain at the site of the incision that is unrelieved with pain medicine. Difficulty breathing with or without chest pain New calf pain especially if only on one side Sudden, continuing increased vaginal bleeding with or without clots.   Contacts: For questions or concerns you should contact:   Dr. Abdul Hodgkin at [phone]   Vira Grieves, NP at [phone]   After Hours: call [phone] and have the GYN Oncologist paged/contacted (after 5 pm or on the weekends). You will speak with an after hours RN and let he or she know you have had surgery.   Messages sent via mychart are for non-urgent matters and are not responded to after hours so for urgent needs, please  call the after hours number.

## 2024-02-03 NOTE — Telephone Encounter (Signed)
 Spoke with Janice Crawford who called to say she doesn't have a ride to the hospital today for her surgery. Pt states her daughter was suppose to bring her and then take her home, but her employer changed her shift? Pt asking if she can drive herself and then stay overnight? Advised patient that with this surgery, she won't be able to stay overnight and she needs a ride home and someone to stay with her for 24 hours after surgery. Advised patient her message will be relayed to providers.

## 2024-02-04 ENCOUNTER — Telehealth: Payer: Self-pay | Admitting: *Deleted

## 2024-02-04 NOTE — Telephone Encounter (Signed)
 Spoke with patient and relayed message from Eleanor Epps, NP that since there have been two same day cancellations, patient's surgery will not be rescheduled  with our practice. Her care is important to us  so options for care include the [facility] at [phone].  Pt verbalized understanding, apologized again and thanked the office.

## 2024-02-04 NOTE — Telephone Encounter (Signed)
-----   Message from Eleanor Epps, NP sent at 02/04/2024  3:09 PM EST ----- Regarding: RE: wants to reschedule surgery Please let her know since there have been two same day cancellations, her surgery will not be rescheduled with our practice. Her care is important to us  so options for care include the [facility] at [phone]. ----- Message ----- From: Warden Dawna HERO, RN Sent: 02/04/2024  12:00 AM EST To: Ami LITTIE Fess, RN; Eleanor JONETTA Epps, NP Subject: wants to reschedule surgery                    Just an [email or web address].[email or web address] called this afternoon to reschedule surgery

## 2024-02-16 ENCOUNTER — Inpatient Hospital Stay: Attending: Hematology and Oncology | Admitting: Obstetrics & Gynecology

## 2024-02-22 ENCOUNTER — Encounter: Payer: Self-pay | Admitting: Obstetrics & Gynecology

## 2024-02-23 ENCOUNTER — Telehealth: Payer: Self-pay

## 2024-02-23 NOTE — Telephone Encounter (Signed)
 Pt called stating that she missed both of her appt.s with surgeon because she  didn't have transportation. Pt states that they terminated her. She would like to wait for Dr Odean to come back to see if he can find another careers adviser.

## 2024-03-04 ENCOUNTER — Telehealth: Payer: Self-pay | Admitting: Adult Health

## 2024-03-04 NOTE — Telephone Encounter (Signed)
 I left voicemail for patient regarding rescheduled appointment from 04/01/2024 to 04/04/2024.

## 2024-03-23 ENCOUNTER — Telehealth: Payer: Self-pay

## 2024-03-23 NOTE — Telephone Encounter (Signed)
 LVM for pt to return my phone call regarding FMLA.

## 2024-04-01 ENCOUNTER — Inpatient Hospital Stay: Admitting: Adult Health

## 2024-04-04 ENCOUNTER — Inpatient Hospital Stay: Attending: Hematology and Oncology | Admitting: Adult Health
